# Patient Record
Sex: Female | Born: 1990 | Race: Black or African American | Hispanic: No | Marital: Single | Smoking: Former smoker
Health system: Southern US, Community
[De-identification: ages and names within clinical notes are randomized; demographics above are authoritative.]

## PROBLEM LIST (undated history)

## (undated) ENCOUNTER — Inpatient Hospital Stay (HOSPITAL_COMMUNITY): Payer: Self-pay

## (undated) DIAGNOSIS — J45909 Unspecified asthma, uncomplicated: Secondary | ICD-10-CM

## (undated) DIAGNOSIS — N200 Calculus of kidney: Secondary | ICD-10-CM

## (undated) DIAGNOSIS — R569 Unspecified convulsions: Secondary | ICD-10-CM

## (undated) DIAGNOSIS — E876 Hypokalemia: Secondary | ICD-10-CM

## (undated) DIAGNOSIS — D649 Anemia, unspecified: Secondary | ICD-10-CM

## (undated) HISTORY — PX: NO PAST SURGERIES: SHX2092

## (undated) HISTORY — PX: WISDOM TOOTH EXTRACTION: SHX21

---

## 2016-01-25 ENCOUNTER — Emergency Department (HOSPITAL_COMMUNITY): Payer: Medicaid Other

## 2016-01-25 ENCOUNTER — Emergency Department (HOSPITAL_COMMUNITY)
Admission: EM | Admit: 2016-01-25 | Discharge: 2016-01-25 | Disposition: A | Discharge status: Home / Self Care | Payer: Medicaid Other | Attending: Emergency Medicine | Admitting: Emergency Medicine

## 2016-01-25 ENCOUNTER — Encounter (HOSPITAL_COMMUNITY): Payer: Self-pay | Admitting: Emergency Medicine

## 2016-01-25 DIAGNOSIS — R112 Nausea with vomiting, unspecified: Secondary | ICD-10-CM | POA: Diagnosis present

## 2016-01-25 DIAGNOSIS — J45909 Unspecified asthma, uncomplicated: Secondary | ICD-10-CM | POA: Insufficient documentation

## 2016-01-25 DIAGNOSIS — Z3202 Encounter for pregnancy test, result negative: Secondary | ICD-10-CM | POA: Diagnosis not present

## 2016-01-25 DIAGNOSIS — N390 Urinary tract infection, site not specified: Secondary | ICD-10-CM | POA: Diagnosis not present

## 2016-01-25 HISTORY — DX: Unspecified asthma, uncomplicated: J45.909

## 2016-01-25 LAB — CBC
HCT: 35.1 % — ABNORMAL LOW (ref 36.0–46.0)
Hemoglobin: 11.2 g/dL — ABNORMAL LOW (ref 12.0–15.0)
MCH: 27.2 pg (ref 26.0–34.0)
MCHC: 31.9 g/dL (ref 30.0–36.0)
MCV: 85.2 fL (ref 78.0–100.0)
PLATELETS: 189 10*3/uL (ref 150–400)
RBC: 4.12 MIL/uL (ref 3.87–5.11)
RDW: 12.7 % (ref 11.5–15.5)
WBC: 7.3 10*3/uL (ref 4.0–10.5)

## 2016-01-25 LAB — COMPREHENSIVE METABOLIC PANEL
ALT: 11 U/L — ABNORMAL LOW (ref 14–54)
AST: 15 U/L (ref 15–41)
Albumin: 3.4 g/dL — ABNORMAL LOW (ref 3.5–5.0)
Alkaline Phosphatase: 53 U/L (ref 38–126)
Anion gap: 12 (ref 5–15)
BILIRUBIN TOTAL: 1 mg/dL (ref 0.3–1.2)
BUN: 7 mg/dL (ref 6–20)
CO2: 26 mmol/L (ref 22–32)
CREATININE: 0.94 mg/dL (ref 0.44–1.00)
Calcium: 9.1 mg/dL (ref 8.9–10.3)
Chloride: 101 mmol/L (ref 101–111)
GFR calc Af Amer: >60 mL/min (ref >60)
Glucose, Bld: 109 mg/dL — ABNORMAL HIGH (ref 65–99)
Potassium: 3 mmol/L — ABNORMAL LOW (ref 3.5–5.1)
Sodium: 139 mmol/L (ref 135–145)
TOTAL PROTEIN: 7 g/dL (ref 6.5–8.1)

## 2016-01-25 LAB — URINE MICROSCOPIC-ADD ON

## 2016-01-25 LAB — PREGNANCY, URINE: Preg Test, Ur: NEGATIVE

## 2016-01-25 LAB — URINALYSIS, ROUTINE W REFLEX MICROSCOPIC
BILIRUBIN URINE: NEGATIVE
GLUCOSE, UA: NEGATIVE mg/dL
KETONES UR: NEGATIVE mg/dL
NITRITE: NEGATIVE
PROTEIN: 30 mg/dL — AB
Specific Gravity, Urine: 1.012 (ref 1.005–1.030)
pH: 5.5 (ref 5.0–8.0)

## 2016-01-25 LAB — LIPASE, BLOOD: Lipase: 23 U/L (ref 11–51)

## 2016-01-25 MED ORDER — ONDANSETRON HCL 4 MG/2ML IJ SOLN
4.0000 mg | Freq: Once | INTRAMUSCULAR | Status: AC
  Administered 2016-01-25: 4 mg via INTRAVENOUS
  Filled 2016-01-25: qty 2

## 2016-01-25 MED ORDER — SODIUM CHLORIDE 0.9 % IV BOLUS (SEPSIS)
1000.0000 mL | Freq: Once | INTRAVENOUS | Status: AC
  Administered 2016-01-25: 1000 mL via INTRAVENOUS

## 2016-01-25 MED ORDER — CEPHALEXIN 500 MG PO CAPS: 500.0000 mg | ORAL_CAPSULE | Freq: Four times a day (QID) | ORAL | Status: DC

## 2016-01-25 MED ORDER — ONDANSETRON 8 MG PO TBDP: 8.0000 mg | ORAL_TABLET | Freq: Three times a day (TID) | ORAL | Status: DC | PRN

## 2016-01-25 MED ORDER — GI COCKTAIL ~~LOC~~
30.0000 mL | Freq: Once | ORAL | Status: DC
  Filled 2016-01-25: qty 30

## 2016-01-25 MED ORDER — ONDANSETRON 4 MG PO TBDP: 4.0000 mg | ORAL_TABLET | Freq: Once | ORAL | Status: DC | PRN

## 2016-01-25 MED ORDER — POTASSIUM CHLORIDE CRYS ER 20 MEQ PO TBCR
40.0000 meq | EXTENDED_RELEASE_TABLET | Freq: Once | ORAL | Status: AC
  Administered 2016-01-25: 40 meq via ORAL
  Filled 2016-01-25: qty 2

## 2016-01-25 NOTE — ED Notes (Signed)
Pt reports having wisdom teeth removed on Wednesday, reports epigastric abd pain and vomiting began after surgery.  Pt denies diarrhea.

## 2016-01-25 NOTE — Discharge Instructions (Signed)
zofran as prescribed as needed for nausea. Keflex for UTI. Make sure to drink plenty of fluids. Advance diet as tolerate. Follow up with your doctor. Return if worsening.   Nausea and Vomiting Nausea is a sick feeling that often comes before throwing up (vomiting). Vomiting is a reflex where stomach contents come out of your mouth. Vomiting can cause severe loss of body fluids (dehydration). Children and elderly adults can become dehydrated quickly, especially if they also have diarrhea. Nausea and vomiting are symptoms of a condition or disease. It is important to find the cause of your symptoms. CAUSES   Direct irritation of the stomach lining. This irritation can result from increased acid production (gastroesophageal reflux disease), infection, food poisoning, taking certain medicines (such as nonsteroidal anti-inflammatory drugs), alcohol use, or tobacco use.  Signals from the brain.These signals could be caused by a headache, heat exposure, an inner ear disturbance, increased pressure in the brain from injury, infection, a tumor, or a concussion, pain, emotional stimulus, or metabolic problems.  An obstruction in the gastrointestinal tract (bowel obstruction).  Illnesses such as diabetes, hepatitis, gallbladder problems, appendicitis, kidney problems, cancer, sepsis, atypical symptoms of a heart attack, or eating disorders.  Medical treatments such as chemotherapy and radiation.  Receiving medicine that makes you sleep (general anesthetic) during surgery. DIAGNOSIS Your caregiver may ask for tests to be done if the problems do not improve after a few days. Tests may also be done if symptoms are severe or if the reason for the nausea and vomiting is not clear. Tests may include:  Urine tests.  Blood tests.  Stool tests.  Cultures (to look for evidence of infection).  X-rays or other imaging studies. Test results can help your caregiver make decisions about treatment or the need for  additional tests. TREATMENT You need to stay well hydrated. Drink frequently but in small amounts.You may wish to drink water, sports drinks, clear broth, or eat frozen ice pops or gelatin dessert to help stay hydrated.When you eat, eating slowly may help prevent nausea.There are also some antinausea medicines that may help prevent nausea. HOME CARE INSTRUCTIONS   Take all medicine as directed by your caregiver.  If you do not have an appetite, do not force yourself to eat. However, you must continue to drink fluids.  If you have an appetite, eat a normal diet unless your caregiver tells you differently.  Eat a variety of complex carbohydrates (rice, wheat, potatoes, bread), lean meats, yogurt, fruits, and vegetables.  Avoid high-fat foods because they are more difficult to digest.  Drink enough water and fluids to keep your urine clear or pale yellow.  If you are dehydrated, ask your caregiver for specific rehydration instructions. Signs of dehydration may include:  Severe thirst.  Dry lips and mouth.  Dizziness.  Dark urine.  Decreasing urine frequency and amount.  Confusion.  Rapid breathing or pulse. SEEK IMMEDIATE MEDICAL CARE IF:   You have blood or Giovannetti flecks (like coffee grounds) in your vomit.  You have black or bloody stools.  You have a severe headache or stiff neck.  You are confused.  You have severe abdominal pain.  You have chest pain or trouble breathing.  You do not urinate at least once every 8 hours.  You develop cold or clammy skin.  You continue to vomit for longer than 24 to 48 hours.  You have a fever. MAKE SURE YOU:   Understand these instructions.  Will watch your condition.  Will get help  right away if you are not doing well or get worse.   This information is not intended to replace advice given to you by your health care provider. Make sure you discuss any questions you have with your health care provider.   Document  Released: 10/10/2005 Document Revised: 01/02/2012 Document Reviewed: 03/09/2011 Elsevier Interactive Patient Education [employer]2016 [employer]vier Inc.

## 2016-01-25 NOTE — ED Provider Notes (Signed)
CSN: [id number]     Arrival date & time 01/25/16  0744 History   First MD Initiated Contact with Patient 01/25/16 [phone]     Chief Complaint  Patient presents with  . Abdominal Pain  . Emesis     (Consider location/radiation/quality/duration/timing/severity/associated sxs/prior Treatment) HPI Mercedes [name] is a 25 y.o. female  With History of asthma, presents to emergency department complaining of epigastric abdominal pain, nausea, vomiting for 5 days. Patient states her symptoms began on the day after she had her wisdom teeth pulled. She reports being sedated for procedure. She is unsure if she received any stitches. She states pain and swelling in her teeth has improved. Pt only required pain medication for 1 day. She is currently not taking any medications. She reports over last 4-5 days she has had persistent nausea and vomiting with eating and drinking. She states even drinking water makes her immediately nauseated and at times makes her throw up. Denies fever. No diarrhea. No urinary symptoms. No blood in stool or emesis. No tx prior to coming in.    Past Medical History  Diagnosis Date  . Asthma    Past Surgical History  Procedure Laterality Date  . Wisdom tooth extraction     No family history on file. Social History  Substance Use Topics  . Smoking status: Never Smoker   . Smokeless tobacco: Never Used  . Alcohol Use: No   OB History    No data available     Review of Systems  Constitutional: Negative for fever and chills.  HENT: Positive for dental problem.   Respiratory: Negative for cough, chest tightness and shortness of breath.   Cardiovascular: Negative for chest pain, palpitations and leg swelling.  Gastrointestinal: Positive for nausea, vomiting and abdominal pain. Negative for diarrhea.  Genitourinary: Negative for dysuria, flank pain, vaginal bleeding, vaginal discharge, vaginal pain and pelvic pain.  Musculoskeletal: Negative for myalgias, arthralgias,  neck pain and neck stiffness.  Skin: Negative for rash.  Neurological: Negative for dizziness, weakness and headaches.  All other systems reviewed and are negative.     Allergies  Review of patient's allergies indicates not on file.  Home Medications   Prior to Admission medications   Not on File   BP 136/69 mmHg  Pulse 79  Temp(Src) 99.2 F (37.3 C) (Oral)  Resp 16  Ht  (1.626 m)  Wt 107.502 kg  BMI 40.66 kg/m2  SpO2 97%  LMP 12/27/2015 (Approximate) Physical Exam  Constitutional: She appears well-developed and well-nourished. No distress.  HENT:  Head: Normocephalic.  All 4 sockets with clot present. No gum swelling or any evidence of infection. No trismus. No swelling in the tongue.  Eyes: Conjunctivae are normal.  Neck: Neck supple.  Cardiovascular: Normal rate, regular rhythm and normal heart sounds.   Pulmonary/Chest: Effort normal and breath sounds normal. No respiratory distress. She has no wheezes. She has no rales.  Abdominal: Soft. Bowel sounds are normal. She exhibits no distension. There is tenderness. There is no rebound and no guarding.  RUQ, epigastric, LUQ abdominal tenderness  Musculoskeletal: She exhibits no edema.  Neurological: She is alert.  Skin: Skin is warm and dry.  Psychiatric: She has a normal mood and affect. Her behavior is normal.  Nursing note and vitals reviewed.   ED Course  Procedures (including critical care time) Labs Review Labs Reviewed  COMPREHENSIVE METABOLIC PANEL - Abnormal; Notable for the following:    Potassium 3.0 (*)    Glucose, Bld  109 (*)    Albumin 3.4 (*)    ALT 11 (*)    All other components within normal limits  CBC - Abnormal; Notable for the following:    Hemoglobin 11.2 (*)    HCT 35.1 (*)    All other components within normal limits  URINALYSIS, ROUTINE W REFLEX MICROSCOPIC (NOT AT [facility]) - Abnormal; Notable for the following:    APPearance CLOUDY (*)    Hgb urine dipstick SMALL (*)    Protein,  ur 30 (*)    Leukocytes, UA MODERATE (*)    All other components within normal limits  URINE MICROSCOPIC-ADD ON - Abnormal; Notable for the following:    Squamous Epithelial / LPF 6-30 (*)    Bacteria, UA MANY (*)    All other components within normal limits  LIPASE, BLOOD  PREGNANCY, URINE    Imaging Review [place] Abdomen Complete  01/25/2016  CLINICAL DATA:  Epigastric pain with nausea and vomiting after wisdom tooth surgery last week. EXAM: ABDOMEN ULTRASOUND COMPLETE COMPARISON:  None. FINDINGS: Gallbladder: No gallstones or wall thickening visualized. No sonographic Murphy sign noted by sonographer. Common bile duct: Diameter: 3 mm Liver: No focal lesion identified. Within normal limits in parenchymal echogenicity. IVC: No abnormality visualized. Pancreas: Visualized portion unremarkable. Spleen: Size and appearance within normal limits. Right Kidney: Length: 13.0 cm. Echogenicity within normal limits. No mass or hydronephrosis visualized. Left Kidney: Length: 12.2 cm. Echogenicity within normal limits. No mass or hydronephrosis visualized. Abdominal aorta: No aneurysm visualized. Other findings: None. IMPRESSION: Unremarkable abdominal ultrasound. Electronically Signed   By: Sebastian AcheAllen  Grady M.D.   On: 01/25/2016 10:41   I have personally reviewed and evaluated these images and lab results as part of my medical decision-making.   EKG Interpretation None      MDM   Final diagnoses:  Non-intractable vomiting with nausea, vomiting of unspecified type  UTI (lower urinary tract infection)    patient with upper abdominal pain for 4-5 days, nausea, vomiting. Vital signs are normal. Abdomen is soft with no guarding or peritoneal signs. Will check labs, urinalysis, urine pregnancy. Will start IV fluids and Zofran ordered for nausea. GI cocktail ordered. Will reassess.  10:55 AM Normal white blood cell count, potassium 3.0, given 40 mEq by mouth in the emergency department. Patient hydrated with 2  L of normal saline. She is feeling much better. Ultrasound abdomen is negative. Abdomen is soft, no guarding, do not think patient needs any further imaging in emergency department. She is having bowel movements and passing gas, do not think she has small bowel obstruction. No prior abdominal surgeries. Most likely viral gastroenteritis. Will treat with Zofran at home. Will also start on Keflex for UTI. Will have patient follow-up with primary care doctor. We did discuss return precautions.  Filed Vitals:   01/25/16 0830 01/25/16 0900 01/25/16 0930 01/25/16 1009  BP:      Pulse: 71 67 63 66  Temp:      TempSrc:      Resp:      Height:      Weight:      SpO2: 97% 99% 100% 100%     Jaynie Crumbleatyana Yaris Ferrell, PA-C 01/25/16 1057  Laurence Spatesachel Morgan Little, MD 01/25/16 1530

## 2016-01-25 NOTE — ED Notes (Signed)
Patient transported to Ultrasound 

## 2016-02-27 ENCOUNTER — Encounter (HOSPITAL_COMMUNITY): Payer: Self-pay

## 2016-02-27 ENCOUNTER — Emergency Department (HOSPITAL_COMMUNITY): Payer: Medicaid Other

## 2016-02-27 ENCOUNTER — Inpatient Hospital Stay (HOSPITAL_COMMUNITY)
Admission: EM | Admit: 2016-02-27 | Discharge: 2016-02-29 | DRG: 203 | Disposition: A | Discharge status: Home / Self Care | Payer: Medicaid Other | Attending: Internal Medicine | Admitting: Internal Medicine

## 2016-02-27 DIAGNOSIS — Z79899 Other long term (current) drug therapy: Secondary | ICD-10-CM

## 2016-02-27 DIAGNOSIS — J441 Chronic obstructive pulmonary disease with (acute) exacerbation: Secondary | ICD-10-CM | POA: Diagnosis present

## 2016-02-27 DIAGNOSIS — O99019 Anemia complicating pregnancy, unspecified trimester: Secondary | ICD-10-CM

## 2016-02-27 DIAGNOSIS — D649 Anemia, unspecified: Secondary | ICD-10-CM | POA: Diagnosis present

## 2016-02-27 DIAGNOSIS — J4520 Mild intermittent asthma, uncomplicated: Secondary | ICD-10-CM | POA: Diagnosis not present

## 2016-02-27 DIAGNOSIS — E876 Hypokalemia: Secondary | ICD-10-CM | POA: Diagnosis not present

## 2016-02-27 DIAGNOSIS — O99519 Diseases of the respiratory system complicating pregnancy, unspecified trimester: Secondary | ICD-10-CM

## 2016-02-27 DIAGNOSIS — J45909 Unspecified asthma, uncomplicated: Secondary | ICD-10-CM | POA: Diagnosis present

## 2016-02-27 DIAGNOSIS — J45901 Unspecified asthma with (acute) exacerbation: Secondary | ICD-10-CM | POA: Diagnosis present

## 2016-02-27 DIAGNOSIS — Z880 Allergy status to penicillin: Secondary | ICD-10-CM

## 2016-02-27 HISTORY — DX: Anemia, unspecified: D64.9

## 2016-02-27 HISTORY — DX: Hypokalemia: E87.6

## 2016-02-27 LAB — CBC WITH DIFFERENTIAL/PLATELET
BASOS ABS: 0 10*3/uL (ref 0.0–0.1)
BASOS PCT: 0 %
Eosinophils Absolute: 0 10*3/uL (ref 0.0–0.7)
Eosinophils Relative: 1 %
HEMATOCRIT: 32.9 % — AB (ref 36.0–46.0)
HEMOGLOBIN: 10.5 g/dL — AB (ref 12.0–15.0)
LYMPHS PCT: 16 %
Lymphs Abs: 1.1 10*3/uL (ref 0.7–4.0)
MCH: 27.2 pg (ref 26.0–34.0)
MCHC: 31.9 g/dL (ref 30.0–36.0)
MCV: 85.2 fL (ref 78.0–100.0)
MONO ABS: 0.2 10*3/uL (ref 0.1–1.0)
Monocytes Relative: 3 %
NEUTROS PCT: 80 %
Neutro Abs: 5.7 10*3/uL (ref 1.7–7.7)
Platelets: 160 10*3/uL (ref 150–400)
RBC: 3.86 MIL/uL — ABNORMAL LOW (ref 3.87–5.11)
RDW: 14 % (ref 11.5–15.5)
WBC: 7.1 10*3/uL (ref 4.0–10.5)

## 2016-02-27 LAB — MAGNESIUM: Magnesium: 1.8 mg/dL (ref 1.7–2.4)

## 2016-02-27 LAB — BASIC METABOLIC PANEL
ANION GAP: 13 (ref 5–15)
BUN: 9 mg/dL (ref 6–20)
CHLORIDE: 106 mmol/L (ref 101–111)
CO2: 21 mmol/L — AB (ref 22–32)
Calcium: 8.8 mg/dL — ABNORMAL LOW (ref 8.9–10.3)
Creatinine, Ser: 0.66 mg/dL (ref 0.44–1.00)
GFR calc non Af Amer: >60 mL/min (ref >60)
GLUCOSE: 103 mg/dL — AB (ref 65–99)
POTASSIUM: 2.9 mmol/L — AB (ref 3.5–5.1)
Sodium: 140 mmol/L (ref 135–145)

## 2016-02-27 LAB — POTASSIUM: POTASSIUM: 3 mmol/L — AB (ref 3.5–5.1)

## 2016-02-27 MED ORDER — IPRATROPIUM BROMIDE 0.02 % IN SOLN
0.5000 mg | RESPIRATORY_TRACT | Status: DC
  Administered 2016-02-27 (×2): 0.5 mg via RESPIRATORY_TRACT
  Filled 2016-02-27 (×2): qty 2.5

## 2016-02-27 MED ORDER — AZITHROMYCIN 250 MG PO TABS
500.0000 mg | ORAL_TABLET | Freq: Once | ORAL | Status: AC
  Administered 2016-02-27: 500 mg via ORAL
  Filled 2016-02-27: qty 2

## 2016-02-27 MED ORDER — PREDNISONE 20 MG PO TABS
60.0000 mg | ORAL_TABLET | Freq: Once | ORAL | Status: AC
  Administered 2016-02-27: 60 mg via ORAL
  Filled 2016-02-27: qty 3

## 2016-02-27 MED ORDER — MAGNESIUM CITRATE PO SOLN: 1.0000 | Freq: Once | ORAL | Status: DC | PRN

## 2016-02-27 MED ORDER — ONDANSETRON 4 MG PO TBDP
4.0000 mg | ORAL_TABLET | Freq: Once | ORAL | Status: AC
  Administered 2016-02-27: 4 mg via ORAL
  Filled 2016-02-27: qty 1

## 2016-02-27 MED ORDER — IPRATROPIUM BROMIDE 0.02 % IN SOLN: 0.5000 mg | Freq: Once | RESPIRATORY_TRACT | Status: DC

## 2016-02-27 MED ORDER — SODIUM CHLORIDE 0.9% FLUSH: 3.0000 mL | Freq: Two times a day (BID) | INTRAVENOUS | Status: DC

## 2016-02-27 MED ORDER — ACETAMINOPHEN 325 MG PO TABS
650.0000 mg | ORAL_TABLET | Freq: Four times a day (QID) | ORAL | Status: DC | PRN
  Administered 2016-02-27: 650 mg via ORAL
  Filled 2016-02-27: qty 2

## 2016-02-27 MED ORDER — ENOXAPARIN SODIUM 40 MG/0.4ML ~~LOC~~ SOLN
40.0000 mg | SUBCUTANEOUS | Status: DC
  Filled 2016-02-27: qty 0.4

## 2016-02-27 MED ORDER — SODIUM CHLORIDE 0.9% FLUSH: 3.0000 mL | INTRAVENOUS | Status: DC | PRN

## 2016-02-27 MED ORDER — LEVALBUTEROL HCL 1.25 MG/0.5ML IN NEBU
1.2500 mg | INHALATION_SOLUTION | Freq: Four times a day (QID) | RESPIRATORY_TRACT | Status: DC
  Administered 2016-02-27 – 2016-02-29 (×8): 1.25 mg via RESPIRATORY_TRACT
  Filled 2016-02-27 (×9): qty 0.5

## 2016-02-27 MED ORDER — SODIUM CHLORIDE 0.9 % IV SOLN: 250.0000 mL | INTRAVENOUS | Status: DC | PRN

## 2016-02-27 MED ORDER — SODIUM CHLORIDE 0.9 % IV SOLN
INTRAVENOUS | Status: DC
  Administered 2016-02-27 – 2016-02-28 (×5): via INTRAVENOUS

## 2016-02-27 MED ORDER — METHYLPREDNISOLONE SODIUM SUCC 125 MG IJ SOLR
60.0000 mg | Freq: Four times a day (QID) | INTRAMUSCULAR | Status: DC
  Administered 2016-02-27 – 2016-02-29 (×9): 60 mg via INTRAVENOUS
  Filled 2016-02-27 (×9): qty 2

## 2016-02-27 MED ORDER — PNEUMOCOCCAL VAC POLYVALENT 25 MCG/0.5ML IJ INJ
0.5000 mL | INJECTION | INTRAMUSCULAR | Status: DC
  Filled 2016-02-27: qty 0.5

## 2016-02-27 MED ORDER — ALBUTEROL (5 MG/ML) CONTINUOUS INHALATION SOLN
10.0000 mg/h | INHALATION_SOLUTION | Freq: Once | RESPIRATORY_TRACT | Status: AC
  Administered 2016-02-27: 10 mg/h via RESPIRATORY_TRACT
  Filled 2016-02-27: qty 20

## 2016-02-27 MED ORDER — ONDANSETRON HCL 4 MG/2ML IJ SOLN: 4.0000 mg | Freq: Four times a day (QID) | INTRAMUSCULAR | Status: DC | PRN

## 2016-02-27 MED ORDER — HYDROCODONE-ACETAMINOPHEN 5-325 MG PO TABS
1.0000 | ORAL_TABLET | Freq: Once | ORAL | Status: AC
  Administered 2016-02-27: 1 via ORAL
  Filled 2016-02-27: qty 1

## 2016-02-27 MED ORDER — HYDROCODONE-ACETAMINOPHEN 5-325 MG PO TABS
1.0000 | ORAL_TABLET | ORAL | Status: DC | PRN
  Administered 2016-02-27 – 2016-02-29 (×2): 2 via ORAL
  Filled 2016-02-27 (×2): qty 2

## 2016-02-27 MED ORDER — ALBUTEROL SULFATE (2.5 MG/3ML) 0.083% IN NEBU
5.0000 mg | INHALATION_SOLUTION | Freq: Once | RESPIRATORY_TRACT | Status: AC
  Administered 2016-02-27: 5 mg via RESPIRATORY_TRACT
  Filled 2016-02-27: qty 6

## 2016-02-27 MED ORDER — LEVALBUTEROL HCL 1.25 MG/0.5ML IN NEBU
1.2500 mg | INHALATION_SOLUTION | Freq: Four times a day (QID) | RESPIRATORY_TRACT | Status: DC
  Administered 2016-02-27: 1.25 mg via RESPIRATORY_TRACT
  Filled 2016-02-27 (×2): qty 0.5

## 2016-02-27 MED ORDER — IPRATROPIUM BROMIDE 0.02 % IN SOLN
0.5000 mg | Freq: Four times a day (QID) | RESPIRATORY_TRACT | Status: DC
  Administered 2016-02-27 – 2016-02-29 (×8): 0.5 mg via RESPIRATORY_TRACT
  Filled 2016-02-27 (×9): qty 2.5

## 2016-02-27 MED ORDER — ONDANSETRON HCL 4 MG PO TABS: 4.0000 mg | ORAL_TABLET | Freq: Four times a day (QID) | ORAL | Status: DC | PRN

## 2016-02-27 MED ORDER — TRAZODONE HCL 50 MG PO TABS: 25.0000 mg | ORAL_TABLET | Freq: Every evening | ORAL | Status: DC | PRN

## 2016-02-27 MED ORDER — SODIUM CHLORIDE 0.9% FLUSH
3.0000 mL | Freq: Two times a day (BID) | INTRAVENOUS | Status: DC
  Administered 2016-02-28 – 2016-02-29 (×2): 3 mL via INTRAVENOUS

## 2016-02-27 MED ORDER — ALBUTEROL SULFATE (2.5 MG/3ML) 0.083% IN NEBU: 5.0000 mg | INHALATION_SOLUTION | Freq: Once | RESPIRATORY_TRACT | Status: DC

## 2016-02-27 MED ORDER — ENSURE ENLIVE PO LIQD
237.0000 mL | Freq: Two times a day (BID) | ORAL | Status: DC
  Administered 2016-02-27 – 2016-02-29 (×3): 237 mL via ORAL

## 2016-02-27 MED ORDER — SENNOSIDES-DOCUSATE SODIUM 8.6-50 MG PO TABS: 1.0000 | ORAL_TABLET | Freq: Every evening | ORAL | Status: DC | PRN

## 2016-02-27 MED ORDER — ACETAMINOPHEN 650 MG RE SUPP: 650.0000 mg | Freq: Four times a day (QID) | RECTAL | Status: DC | PRN

## 2016-02-27 MED ORDER — BISACODYL 10 MG RE SUPP: 10.0000 mg | Freq: Every day | RECTAL | Status: DC | PRN

## 2016-02-27 MED ORDER — IPRATROPIUM BROMIDE 0.02 % IN SOLN
0.5000 mg | Freq: Once | RESPIRATORY_TRACT | Status: AC
  Administered 2016-02-27: 0.5 mg via RESPIRATORY_TRACT
  Filled 2016-02-27: qty 2.5

## 2016-02-27 NOTE — H&P (Signed)
History and Physical    Mercedes PascalShawnte Monne Mcfarlane WUJ:[id number] DOB: [birth year] DOA: 02/27/2016  Referring MD/NP/PA: EDP PCP: No primary care provider on file.  Outpatient Specialists: No care team member to display  Patient coming from:  Home  Chief Complaint: Asthma Exacerbation  HPI: Mercedes [name] is a 25 y.o. female with a history of asthma, presenting with acute onset of SOB since midnight, and wheezing and cough. Patient used albuterol home inhaler without significant relief. She reports having moved last month from [place] with her 2 small children which may have been a source of stress. Last hospitalization for asthma exacerbation was 2 years ago.  Denies any fevers, chest pain, palpitations, loss of conscioussness, or productive cough.She denies fevers, chills, night sweats, vision changes, or mucositis. Denies any chest pain or palpitations. Denies lower extremity swelling. Denies nausea, heartburn or change in bowel habits. Denies abdominal pain. Appetite is normal. Denies any dysuria. Denies abnormal skin rashes, or neuropathy. Denies any bleeding issues such as epistaxis, hematemesis, hematuria or hematochezia. She has been increasingly tired over the last 2 months. Ambulating without difficulty. Does not smoke.    ED Course:  BP 118/66 mmHg  Pulse 97  Temp(Src) 98.1 F (36.7 C)  Resp 18  Ht 5\' 5"  (1.651 m)  Wt 108.863 kg (240 lb)  BMI 39.94 kg/m2  SpO2 96%  LMP 02/27/2016 (Exact Date) CXR showed peribronchial thickening. consistent with asthma exacerbation.  Received Albuterol/Ipratropium nebs and prednisone 60 mg x1 with some relief. WBC 7.3, tachycardia, tachypnea, temperature normal. Initially EDP wanted to discharge patient but symptoms worsened on ambulation for which she is to be admitted. CBC  Remarkable for Hb 10.5 normal 7.1 and platelet and CMET remark for K 2.9   Review of Systems: As per HPI otherwise 10 point review of systems negative.   Past Medical History    Diagnosis Date  . Asthma     Past Surgical History  Procedure Laterality Date  . Wisdom tooth extraction       reports that she has never smoked. She has never used smokeless tobacco. She reports that she does not drink alcohol or use illicit drugs.  Allergies  Allergen Reactions  . Penicillins     Childhood allergy    Family History  Problem Relation Age of Onset  . Cancer Mother   . Asthma Mother     Family history reviewed and not pertinent (If you reviewed it)  Prior to Admission medications   Medication Sig Start Date End Date Taking? Authorizing Provider  albuterol (PROVENTIL) (2.5 MG/3ML) 0.083% nebulizer solution Take 2.5 mg by nebulization every 6 (six) hours as needed for wheezing or shortness of breath.   Yes Historical Provider, MD  cephALEXin (KEFLEX) 500 MG capsule Take 1 capsule (500 mg total) by mouth 4 (four) times daily. Patient not taking: Reported on 02/27/2016 01/25/16   Tatyana Kirichenko, PA-C  ondansetron (ZOFRAN ODT) 8 MG disintegrating tablet Take 1 tablet (8 mg total) by mouth every 8 (eight) hours as needed for nausea or vomiting. Patient not taking: Reported on 02/27/2016 01/25/16   Jaynie Crumbleatyana Kirichenko, PA-C    Physical Exam:    Filed Vitals:   02/27/16 0600 02/27/16 0615 02/27/16 0700 02/27/16 0742  BP: 143/59 116/63 122/55 118/66  Pulse:    97  Temp:    98.1 F (36.7 C)  Resp:    18  Height:      Weight:      SpO2:    96%  Constitutional: NAD, calm, comfortable Filed Vitals:   02/27/16 0600 02/27/16 0615 02/27/16 0700 02/27/16 0742  BP: 143/59 116/63 122/55 118/66  Pulse:    97  Temp:    98.1 F (36.7 C)  Resp:    18  Height:      Weight:      SpO2:    96%   Eyes: PERRL, lids and conjunctivae normal ENMT: Mucous membranes are moist. Posterior pharynx clear of any exudate or lesions.Normal dentition.  Neck: normal, supple, no masses, no thyromegaly Respiratory: bilateral  wheezing, no crackles, diffuse rhonchi. Normal  respiratory effort. No accessory muscle use.  Cardiovascular: Regular rate and rhythm, no murmurs / rubs / gallops. No extremity edema. 2+ pedal pulses. No carotid bruits.  Abdomen: no tenderness, no masses palpated. No hepatosplenomegaly. Bowel sounds positive.  Musculoskeletal: no clubbing / cyanosis. No joint deformity upper and lower extremities. Good ROM, no contractures. Normal muscle tone.  Skin: no rashes, lesions, ulcers. No induration Neurologic: CN 2-12 grossly intact. Sensation intact, DTR normal. Strength 5/5 in all 4.  Psychiatric: Normal judgment and insight. Alert and oriented x 3. Normal mood.     Labs on Admission: I have personally reviewed following labs and imaging studies  CBC:  Recent Labs Lab 02/27/16 0634  WBC 7.1  NEUTROABS 5.7  HGB 10.5*  HCT 32.9*  MCV 85.2  PLT 160    Basic Metabolic Panel:  Recent Labs Lab 02/27/16 0634  NA 140  K 2.9*  CL 106  CO2 21*  GLUCOSE 103*  BUN 9  CREATININE 0.66  CALCIUM 8.8*    GFR: Estimated Creatinine Clearance: 133.2 mL/min (by C-G formula based on Cr of 0.66).   Sepsis Labs: @LABRCNTIP (procalcitonin:4,lacticidven:4) )No results found for this or any previous visit (from the past 240 hour(s)).   Radiological Exams on Admission: Dg Chest 2 View  02/27/2016  CLINICAL DATA:  Shortness of breath, cough, and chest tightness for 2 days. Nonsmoker. History of asthma. EXAM: CHEST  2 VIEW COMPARISON:  None. FINDINGS: Normal heart size and pulmonary vascularity. Peribronchial thickening and central interstitial prominence consistent with asthma or reactive airways disease. No focal consolidation or airspace disease. No blunting of costophrenic angles. No pneumothorax. Mediastinal contours appear intact. Prominent transverse processes at C7. IMPRESSION: Peribronchial thickening with central interstitial pattern consistent with asthma or reactive airways disease. Electronically Signed   By: Burman Nieves M.D.   On:  02/27/2016 05:30    EKG: Independently reviewed.  Assessment/Plan Active Problems:   Asthma exacerbation   Anemia   Hypokalemia   Asthma   Acute respiratory distress without hypoxia/ Asthma exacerbation. CXR showed peribronchial thickening consistent with asthma exacerbation.  Received Albuterol/Ipratropium nebs and prednisone 60 mg x 1 dose with some relief. WBC 7.3, tachycardia, tachypnea, temperature normal.  -Admit to tele obs -Continue supportive care with oxygen as needed -Scheduled Atrovent every 4 hrs Xopenex every 6 hrs -IV Solu-Medrol 60 mg every 6 hours  -Respiratory therapy  Hypokalemia, Unknown etiology, likely dietary. She is not symptomatic. VS normal . Not confused.  Current K  2.7   Recheck labs EKG  Oral replenishment 40 bid if repeat is abnl.  Check Mg  Anemia Hb 10.5 with nl MCV. Currently menstruating.  Recheck in am. -Transfuse 1 unit packed red blood cells if Hb is less than 8 or acutely bleeding    DVT prophylaxis: Lovenox Code Status:   Full    Family Communication:  Discussed with patient Disposition Plan: Expect patient to  be discharged to home Consults called:    None Admission status: Obs Tele   Marlowe Kays E, PA-C [facility]   If 7PM-7AM, please contact night-coverage [email or web address] Password [facility]  02/27/2016, 8:37 AM

## 2016-02-27 NOTE — Progress Notes (Signed)
Mercedes [name] [id number] Admission Data: 02/27/2016 8:00 AM Attending Provider: Pete Glatterawn T Langeland, MD  PCP:No primary care provider on file. Consults/ Treatment Team:    Mercedes PascalShawnte Monne Trovato is a 25 y.o. female patient admitted from ED awake, alert  & orientated  X 3,  No Order, VSS - Blood pressure 118/66, pulse 97, temperature 98.1 F (36.7 C), resp. rate 18, height 5\' 5"  (1.651 m), weight 108.863 kg (240 lb), last menstrual period 02/27/2016, SpO2 96 %., no c/o shortness of breath, no c/o chest pain, no distress noted. Tele # 01 placed.   [facility]   Allergies:   Allergies  Allergen Reactions  . Penicillins     Childhood allergy     Past Medical History  Diagnosis Date  . Asthma      Pt orientation to unit, room and routine. Information packet given to patient/family and safety video watched.  Admission INP armband ID verified with patient/family, and in place. SR up x 2, fall risk assessment complete with Patient and family verbalizing understanding of risks associated with falls. Pt verbalizes an understanding of how to use the call bell and to call for help before getting out of bed.  Skin, clean-dry- intact without evidence of bruising, or skin tears.   No evidence of skin break down noted on exam.     Will cont to monitor and assist as needed.  Kern ReapBrumagin, Kaley Jutras L, RN 02/27/2016 8:00 AM

## 2016-02-27 NOTE — ED Provider Notes (Signed)
CSN: [id number]     Arrival date & time 02/27/16  0245 History   First MD Initiated Contact with Patient 02/27/16 0258     Chief Complaint  Patient presents with  . Asthma     (Consider location/radiation/quality/duration/timing/severity/associated sxs/prior Treatment) HPI   Patient has a PMH of asthma. She comes to the ER with asthma exacerbation. She woke up around midnight have an asthma attach. She [facility] the [facility] but it was not helping her symptoms were worsening. On arrival she has increased effort of breathing, retractions and audible wheezing. She denies that she has had cough, fever, congestion, sneezing, headache, sore throat or any other associated symptoms and reports she has been feeling well. The last time she had a severe asthma attack was a few years ago. She does not know what triggered tonight's attack.  Mercedes [name] is a 25 y.o. female  PCP: No primary care provider on file.  Blood pressure 122/72, pulse 94, temperature 98.6 F (37 C), resp. rate 20, height 5\' 5"  (1.651 m), weight 108.863 kg, last menstrual period 02/27/2016, SpO2 99 %.  Negative ROS: Confusion, diaphoresis, fever, headache, weakness (general or focal), change of vision,  neck pain, dysphagia, aphagia, chest pain,   back pain, abdominal pains, nausea, vomiting, diarrhea, lower extremity swelling, rash.   Past Medical History  Diagnosis Date  . Asthma    Past Surgical History  Procedure Laterality Date  . Wisdom tooth extraction     History reviewed. No pertinent family history. Social History  Substance Use Topics  . Smoking status: Never Smoker   . Smokeless tobacco: Never Used  . Alcohol Use: No   OB History    No data available     Review of Systems  Review of Systems All other systems negative except as documented in the HPI. All pertinent positives and negatives as reviewed in the HPI.   Allergies  Penicillins  Home Medications   Prior to Admission  medications   Medication Sig Start Date End Date Taking? Authorizing Provider  albuterol (PROVENTIL) (2.5 MG/3ML) 0.083% nebulizer solution Take 2.5 mg by nebulization every 6 (six) hours as needed for wheezing or shortness of breath.   Yes Historical Provider, MD  cephALEXin (KEFLEX) 500 MG capsule Take 1 capsule (500 mg total) by mouth 4 (four) times daily. Patient not taking: Reported on 02/27/2016 01/25/16   Tatyana Kirichenko, PA-C  ondansetron (ZOFRAN ODT) 8 MG disintegrating tablet Take 1 tablet (8 mg total) by mouth every 8 (eight) hours as needed for nausea or vomiting. Patient not taking: Reported on 02/27/2016 01/25/16   Tatyana Kirichenko, PA-C   BP 110/93 mmHg  Pulse 109  Temp(Src) 98.6 F (37 C)  Resp 20  Ht 5\' 5"  (1.651 m)  Wt 108.863 kg  BMI 39.94 kg/m2  SpO2 96%  LMP 02/27/2016 (Exact Date) Physical Exam  Constitutional: She appears well-developed and well-nourished. No distress.  HENT:  Head: Normocephalic and atraumatic.  Right Ear: Tympanic membrane and ear canal normal.  Left Ear: Tympanic membrane and ear canal normal.  Nose: Nose normal.  Mouth/Throat: Uvula is midline, oropharynx is clear and moist and mucous membranes are normal.  Eyes: Pupils are equal, round, and reactive to light.  Neck: Normal range of motion. Neck supple.  Cardiovascular: Normal rate and regular rhythm.   Pulmonary/Chest: Accessory muscle usage present. No tachypnea. No respiratory distress. She has decreased breath sounds. She has wheezes (diffuse moderate to severe wheezing).  + increased  effort but is not in distress. Able to speak in 5 word sentences and then becomes somewhat breathless.  Abdominal: Soft.  No signs of abdominal distention  Musculoskeletal:  No LE swelling  Neurological: She is alert.  Acting at baseline  Skin: Skin is warm and dry. No rash noted.  Nursing note and vitals reviewed.   ED Course  Procedures (including critical care time) Labs Review Labs Reviewed   CBC WITH DIFFERENTIAL/PLATELET  BASIC METABOLIC PANEL    Imaging Review Dg Chest 2 View  02/27/2016  CLINICAL DATA:  Shortness of breath, cough, and chest tightness for 2 days. Nonsmoker. History of asthma. EXAM: CHEST  2 VIEW COMPARISON:  None. FINDINGS: Normal heart size and pulmonary vascularity. Peribronchial thickening and central interstitial prominence consistent with asthma or reactive airways disease. No focal consolidation or airspace disease. No blunting of costophrenic angles. No pneumothorax. Mediastinal contours appear intact. Prominent transverse processes at C7. IMPRESSION: Peribronchial thickening with central interstitial pattern consistent with asthma or reactive airways disease. Electronically Signed   By: Burman Nieves M.D.   On: 02/27/2016 05:30   I have personally reviewed and evaluated these images and lab results as part of my medical decision-making.   EKG Interpretation None      MDM   Final diagnoses:  Asthma exacerbation  Reactive airway disease, unspecified asthma severity, uncomplicated    CRITICAL CARE Performed by: Dorthula Matas Total critical care time: 35 minutes Critical care time was exclusive of separately billable procedures and treating other patients. Critical care was necessary to treat or prevent imminent or life-threatening deterioration. Critical care was time spent personally by me on the following activities: development of treatment plan with patient and/or surrogate as well as nursing, discussions with consultants, evaluation of patient's response to treatment, examination of patient, obtaining history from patient or surrogate, ordering and performing treatments and interventions, ordering and review of laboratory studies, ordering and review of radiographic studies, pulse oximetry and re-evaluation of patient's condition.   Medications  azithromycin (ZITHROMAX) tablet 500 mg (not administered)  0.9 %  sodium chloride infusion  (not administered)  albuterol (PROVENTIL) (2.5 MG/3ML) 0.083% nebulizer solution 5 mg (not administered)  ipratropium (ATROVENT) nebulizer solution 0.5 mg (not administered)  albuterol (PROVENTIL) (2.5 MG/3ML) 0.083% nebulizer solution 5 mg (5 mg Nebulization Given 02/27/16 0303)  predniSONE (DELTASONE) tablet 60 mg (60 mg Oral Given 02/27/16 0405)  albuterol (PROVENTIL,VENTOLIN) solution continuous neb (10 mg/hr Nebulization Given 02/27/16 0350)  ipratropium (ATROVENT) nebulizer solution 0.5 mg (0.5 mg Nebulization Given 02/27/16 0350)  HYDROcodone-acetaminophen (NORCO/VICODIN) 5-325 MG per tablet 1 tablet (1 tablet Oral Given 02/27/16 0416)  ondansetron (ZOFRAN-ODT) disintegrating tablet 4 mg (4 mg Oral Given 02/27/16 0416)   4:09 am Pt had one breathing treatment in which the patient reports symptomatic relief, she however is still retracting and have increased effort.  Hour long nebulzer and chest xray ordered.  6:00 pm The patient was monitored for two more hours. I was attempting to get her discharged and had her ambulate the hallway, the patient is tearful, having increased effort of breathing and severe wheeze. She reports feeling like she is not moving any air. At this point I will get patient admitted for observation and more breathing treatments before she can safely go home. Chest xray shows asthma vs reactive airway disease.  Admit, [facility], [facility] hospitalist, Dr. Clyde Lundborg, obs, Tele. He agrees to follow-up on labs have not yet been drawn.    Marlon Pel, PA-C 02/27/16 1610  Caryn Bee  Patria Mane, MD 02/27/16 [phone]

## 2016-02-27 NOTE — Progress Notes (Signed)
This is a no charge note  Boyd Kerbsenny in remission per PA, Tiffany  25 year old lady with past medical history of asthma, who presents with cough, SOB and wheezing. Chest x-ray showed peribronchial thickening. consistent with asthma exacerbation. WBC 7.3, tachycardia, tachypnea, temperature normal. Initially EDP wanted to discharge patient, did not do lab, but when ambulate patient, symptoms get worse. Pt is accepted to tele for obs. Pending CBC and BMP.  Lorretta HarpXilin Kage Willmann, MD  [facility] Pager [phone]  If 7PM-7AM, please contact night-coverage [email or web address] Password [email or web address] 02/27/2016, 6:21 AM

## 2016-02-27 NOTE — ED Notes (Signed)
Ambulated with pt in hallway, sats remained >96% on room air. Began coughing and breathing harder after activity.

## 2016-02-27 NOTE — ED Notes (Signed)
Pt from home, pt states "I've been having difficulty breathing since around 0030, I used my nebulizer and it didn't help me" Pt has hx of asthma. Pt able to speak in complete sentences.

## 2016-02-27 NOTE — ED Notes (Signed)
Attempted to call report to 5W 

## 2016-02-27 NOTE — ED Notes (Signed)
Attempted to call report to 5W x 2  

## 2016-02-27 NOTE — ED Notes (Signed)
Pt taken to Xray.

## 2016-02-28 DIAGNOSIS — E876 Hypokalemia: Secondary | ICD-10-CM

## 2016-02-28 DIAGNOSIS — D649 Anemia, unspecified: Secondary | ICD-10-CM | POA: Diagnosis present

## 2016-02-28 DIAGNOSIS — J45901 Unspecified asthma with (acute) exacerbation: Principal | ICD-10-CM

## 2016-02-28 DIAGNOSIS — J4520 Mild intermittent asthma, uncomplicated: Secondary | ICD-10-CM | POA: Diagnosis not present

## 2016-02-28 DIAGNOSIS — Z79899 Other long term (current) drug therapy: Secondary | ICD-10-CM | POA: Diagnosis not present

## 2016-02-28 DIAGNOSIS — Z880 Allergy status to penicillin: Secondary | ICD-10-CM | POA: Diagnosis not present

## 2016-02-28 LAB — COMPREHENSIVE METABOLIC PANEL
ALK PHOS: 47 U/L (ref 38–126)
ALT: 16 U/L (ref 14–54)
AST: 16 U/L (ref 15–41)
Albumin: 3 g/dL — ABNORMAL LOW (ref 3.5–5.0)
Anion gap: 11 (ref 5–15)
BUN: 5 mg/dL — AB (ref 6–20)
CALCIUM: 8.9 mg/dL (ref 8.9–10.3)
CO2: 18 mmol/L — ABNORMAL LOW (ref 22–32)
CREATININE: 0.54 mg/dL (ref 0.44–1.00)
Chloride: 109 mmol/L (ref 101–111)
Glucose, Bld: 122 mg/dL — ABNORMAL HIGH (ref 65–99)
Potassium: 3.9 mmol/L (ref 3.5–5.1)
Sodium: 138 mmol/L (ref 135–145)
Total Bilirubin: 0.5 mg/dL (ref 0.3–1.2)
Total Protein: 7.2 g/dL (ref 6.5–8.1)

## 2016-02-28 LAB — CBC
HCT: 33.8 % — ABNORMAL LOW (ref 36.0–46.0)
Hemoglobin: 10.8 g/dL — ABNORMAL LOW (ref 12.0–15.0)
MCH: 27.3 pg (ref 26.0–34.0)
MCHC: 32 g/dL (ref 30.0–36.0)
MCV: 85.6 fL (ref 78.0–100.0)
PLATELETS: 186 10*3/uL (ref 150–400)
RBC: 3.95 MIL/uL (ref 3.87–5.11)
RDW: 13.9 % (ref 11.5–15.5)
WBC: 9.6 10*3/uL (ref 4.0–10.5)

## 2016-02-28 MED ORDER — ALBUTEROL SULFATE (2.5 MG/3ML) 0.083% IN NEBU
2.5000 mg | INHALATION_SOLUTION | Freq: Four times a day (QID) | RESPIRATORY_TRACT | Status: DC | PRN
  Filled 2016-02-28: qty 3

## 2016-02-28 MED ORDER — ENOXAPARIN SODIUM 60 MG/0.6ML ~~LOC~~ SOLN
50.0000 mg | SUBCUTANEOUS | Status: DC
  Filled 2016-02-28: qty 0.6

## 2016-02-28 NOTE — Evaluation (Signed)
Occupational Therapy Evaluation and Discharge Patient Details Name: Mercedes PascalShawnte Monne [name] MRN: [id number] DOB: June 22, 1991 Today's Date: 02/28/2016    History of Present Illness 25 y.o. female with a history of asthma, presenting with acute onset of SOB, and wheezing and cough.   Clinical Impression   Pt reports she was independent with ADLs PTA. Currently pt is overall mod I for ADLs and functional mobility requiring increased time secondary to SOB/coughing. Pt tolerated mobility outside of room; observed to be SOB, coughing, and wheezing but SpO2=94%. Returned to room where pt sat EOB and self-administered home inhaler; SpO2=99-100%. No further acute OT needs identified; signing off at this time. Please re-consult if needs change. Thank you for this referral.   Follow Up Recommendations  No OT follow up;Supervision - Intermittent    Equipment Recommendations  None recommended by OT    Recommendations for Other Services       Precautions / Restrictions Precautions Precautions: None Restrictions Weight Bearing Restrictions: No      Mobility Bed Mobility Overal bed mobility: Modified Independent             General bed mobility comments: Increased time.  Transfers Overall transfer level: Modified independent Equipment used: None             General transfer comment: Increased time.    Balance Overall balance assessment: No apparent balance deficits (not formally assessed)                                          ADL Overall ADL's : Modified independent                                       General ADL Comments: Increased time required with functional mobility and ADLs secondary to SOB and coughing. Performed functional mobility outside of room; pt observed to be SOB, wheezing, and coughing but SpO2=94% on RA. Returned to room where pt rested sitting EOB and took home inhaler; SpO2=99-100%.     Vision     Perception      Praxis      Pertinent Vitals/Pain Pain Assessment: No/denies pain     Hand Dominance     Extremity/Trunk Assessment Upper Extremity Assessment Upper Extremity Assessment: Overall WFL for tasks assessed   Lower Extremity Assessment Lower Extremity Assessment: Overall WFL for tasks assessed   Cervical / Trunk Assessment Cervical / Trunk Assessment: Normal   Communication Communication Communication: No difficulties   Cognition Arousal/Alertness: Awake/alert Behavior During Therapy: WFL for tasks assessed/performed Overall Cognitive Status: Within Functional Limits for tasks assessed                     General Comments       Exercises       Shoulder Instructions      Home Living Family/patient expects to be discharged to:: Private residence Living Arrangements: Children;Spouse/significant other Available Help at Discharge: Family;Available PRN/intermittently Type of Home: House       Home Layout: Two level;Bed/bath upstairs     Bathroom Shower/Tub: Tub/shower unit Shower/tub characteristics: Engineer, building servicesCurtain Bathroom Toilet: Standard     Home Equipment: Bedside commode   Additional Comments: BSC is for when mother comes to visit      Prior Functioning/Environment Level of Independence: Independent  Comments: Takes care of her 2 young children    OT Diagnosis: Other (comment) (SOB)   OT Problem List:     OT Treatment/Interventions:      OT Goals(Current goals can be found in the care plan section) Acute Rehab OT Goals Patient Stated Goal: Not get tired/SOB so quickly  OT Goal Formulation: All assessment and education complete, DC therapy  OT Frequency:     Barriers to D/C:            Co-evaluation              End of Session Nurse Communication: Other (comment) (SpO2 during mobility and IV pulling out)  Activity Tolerance: Patient limited by fatigue Patient left: in bed;with call bell/phone within reach;with family/visitor  present   Time: 1610-9604 OT Time Calculation (min): 17 min Charges:  OT General Charges $OT Visit: 1 Procedure OT Evaluation $OT Eval Low Complexity: 1 Procedure G-Codes: OT G-codes **NOT FOR INPATIENT CLASS** Functional Assessment Tool Used: Clinical judgement Functional Limitation: Self care Self Care Current Status (V4098): 0 percent impaired, limited or restricted Self Care Goal Status (J1914): 0 percent impaired, limited or restricted Self Care Discharge Status (N8295): 0 percent impaired, limited or restricted   Gaye Alken M.S., OTR/L Pager: [phone]  02/28/2016, 8:47 AM

## 2016-02-28 NOTE — Progress Notes (Signed)
PROGRESS NOTE    Lindaann PascalShawnte Monne Sirianni  DQQ:[id number] DOB: April 13, 1991 DOA: 02/27/2016 PCP: No primary care provider on file.   Outpatient Specialists:     Brief Narrative:  Mercedes [name] is a 25 y.o. female with a Past Medical History of asthma, who has not seen MD recently, new to [place] from [place], who presents with worsening sob/doe couple of days. Min cough, sub fevers, only has albuterol mdi at home, using more frequently lately.    Assessment & Plan:   Active Problems:   Asthma exacerbation   Anemia   Hypokalemia   Asthma   asthma exacerbation, not status asthmaticus currently. Continue duonebs scheduled, iv steroids scheduled, prn albuterol   hypokalemia - repeat levels, chk mg.   DVT prophylaxis:  Lovenox   Code Status: Full Code   Family Communication: Sig other at bedside  Disposition Plan:     Consultants:     Procedures:     Antimicrobials:      Subjective: Still coughing  Objective: Filed Vitals:   02/28/16 0527 02/28/16 0529 02/28/16 0916 02/28/16 1327  BP: 127/56     Pulse: 77     Temp: 98.2 F (36.8 C)     TempSrc: Oral     Resp: 16     Height:      Weight:  103.874 kg (229 lb)    SpO2: 95%  96% 98%    Intake/Output Summary (Last 24 hours) at 02/28/16 1334 Last data filed at 02/28/16 0600  Gross per 24 hour  Intake 2927.09 ml  Output   1200 ml  Net 1727.09 ml   Filed Weights   02/27/16 0742 02/27/16 0914 02/28/16 0529  Weight: 104.8 kg (231 lb 0.7 oz) 104.8 kg (231 lb 0.7 oz) 103.874 kg (229 lb)    Examination:  General exam: Appears calm and comfortable  Respiratory system: no increased work of breathing but still tight and wheezing Cardiovascular system: S1 & S2 heard, RRR. No JVD, murmurs, rubs, gallops or clicks. No pedal edema. Gastrointestinal system: Abdomen is nondistended, soft and nontender. No organomegaly or masses felt. Normal bowel sounds heard. Central nervous system: Alert and  oriented. No focal neurological deficits.   Data Reviewed: I have personally reviewed following labs and imaging studies  CBC:  Recent Labs Lab 02/27/16 0634 02/28/16 0559  WBC 7.1 9.6  NEUTROABS 5.7  --   HGB 10.5* 10.8*  HCT 32.9* 33.8*  MCV 85.2 85.6  PLT 160 186   Basic Metabolic Panel:  Recent Labs Lab 02/27/16 0634 02/27/16 1038 02/28/16 0559  NA 140  --  138  K 2.9* 3.0* 3.9  CL 106  --  109  CO2 21*  --  18*  GLUCOSE 103*  --  122*  BUN 9  --  5*  CREATININE 0.66  --  0.54  CALCIUM 8.8*  --  8.9  MG  --  1.8  --    GFR: Estimated Creatinine Clearance: 127.4 mL/min (by C-G formula based on Cr of 0.54). Liver Function Tests:  Recent Labs Lab 02/28/16 0559  AST 16  ALT 16  ALKPHOS 47  BILITOT 0.5  PROT 7.2  ALBUMIN 3.0*   No results for input(s): LIPASE, AMYLASE in the last 168 hours. No results for input(s): AMMONIA in the last 168 hours. Coagulation Profile: No results for input(s): INR, PROTIME in the last 168 hours. Cardiac Enzymes: No results for input(s): CKTOTAL, CKMB, CKMBINDEX, TROPONINI in the last 168 hours. BNP (last 3  results) No results for input(s): PROBNP in the last 8760 hours. HbA1C: No results for input(s): HGBA1C in the last 72 hours. CBG: No results for input(s): GLUCAP in the last 168 hours. Lipid Profile: No results for input(s): CHOL, HDL, LDLCALC, TRIG, CHOLHDL, LDLDIRECT in the last 72 hours. Thyroid Function Tests: No results for input(s): TSH, T4TOTAL, FREET4, T3FREE, THYROIDAB in the last 72 hours. Anemia Panel: No results for input(s): VITAMINB12, FOLATE, FERRITIN, TIBC, IRON, RETICCTPCT in the last 72 hours. Urine analysis:    Component Value Date/Time   COLORURINE YELLOW 01/25/2016 0815   APPEARANCEUR CLOUDY* 01/25/2016 0815   LABSPEC 1.012 01/25/2016 0815   PHURINE 5.5 01/25/2016 0815   GLUCOSEU NEGATIVE 01/25/2016 0815   HGBUR SMALL* 01/25/2016 0815   BILIRUBINUR NEGATIVE 01/25/2016 0815   KETONESUR  NEGATIVE 01/25/2016 0815   PROTEINUR 30* 01/25/2016 0815   NITRITE NEGATIVE 01/25/2016 0815   LEUKOCYTESUR MODERATE* 01/25/2016 0815    )No results found for this or any previous visit (from the past 240 hour(s)).    Anti-infectives    Start     Dose/Rate Route Frequency Ordered Stop   02/27/16 0600  azithromycin (ZITHROMAX) tablet 500 mg     500 mg Oral  Once 02/27/16 0557 02/27/16 1610       Radiology Studies: Dg Chest 2 View  02/27/2016  CLINICAL DATA:  Shortness of breath, cough, and chest tightness for 2 days. Nonsmoker. History of asthma. EXAM: CHEST  2 VIEW COMPARISON:  None. FINDINGS: Normal heart size and pulmonary vascularity. Peribronchial thickening and central interstitial prominence consistent with asthma or reactive airways disease. No focal consolidation or airspace disease. No blunting of costophrenic angles. No pneumothorax. Mediastinal contours appear intact. Prominent transverse processes at C7. IMPRESSION: Peribronchial thickening with central interstitial pattern consistent with asthma or reactive airways disease. Electronically Signed   By: Burman Nieves M.D.   On: 02/27/2016 05:30        Scheduled Meds: . enoxaparin (LOVENOX) injection  50 mg Subcutaneous Q24H  . feeding supplement (ENSURE ENLIVE)  237 mL Oral BID BM  . ipratropium  0.5 mg Nebulization QID  . levalbuterol  1.25 mg Nebulization QID  . methylPREDNISolone (SOLU-MEDROL) injection  60 mg Intravenous Q6H  . pneumococcal 23 valent vaccine  0.5 mL Intramuscular Tomorrow-1000  . sodium chloride flush  3 mL Intravenous Q12H  . sodium chloride flush  3 mL Intravenous Q12H   Continuous Infusions:       Time spent: 25 min    JESSICA Juanetta Gosling, DO [facility] Pager [phone]  If 7PM-7AM, please contact night-coverage [email or web address] Password [email or web address] 02/28/2016, 1:34 PM

## 2016-02-29 DIAGNOSIS — J4520 Mild intermittent asthma, uncomplicated: Secondary | ICD-10-CM

## 2016-02-29 MED ORDER — PREDNISONE 10 MG PO TABS: ORAL_TABLET | ORAL | Status: DC

## 2016-02-29 MED ORDER — IPRATROPIUM BROMIDE 0.02 % IN SOLN: 0.5000 mg | Freq: Four times a day (QID) | RESPIRATORY_TRACT | Status: DC

## 2016-02-29 MED ORDER — PREDNISONE 50 MG PO TABS: 60.0000 mg | ORAL_TABLET | Freq: Every day | ORAL | Status: DC

## 2016-02-29 MED ORDER — ALBUTEROL SULFATE (2.5 MG/3ML) 0.083% IN NEBU: 2.5000 mg | INHALATION_SOLUTION | Freq: Four times a day (QID) | RESPIRATORY_TRACT | Status: DC | PRN

## 2016-02-29 NOTE — Progress Notes (Signed)
Nutrition Brief Note  Patient identified on the Malnutrition Screening Tool (MST) Report  Wt Readings from Last 15 Encounters:  02/29/16 229 lb (103.874 kg)  01/25/16 237 lb (107.502 kg)   Mercedes [name] is a 25 y.o. female with a Past Medical History of asthma, who has not seen MD recently, new to [place] from [place] recent months, who presents with worsening sob/doe couple of days. Min cough, sub fevers, only has albuterol mdi at home, using more frequently lately.   Body mass index is 39.29 kg/(m^2). Patient meets criteria for obesity, class II based on current BMI.   Current diet order is regular, patient is consuming approximately 50% of meals at this time. Labs and medications reviewed.   No nutrition interventions warranted at this time. If nutrition issues arise, please consult RD.   Elyon Zoll A. Mayford KnifeWilliams, RD, LDN, CDE Pager: [phone] After hours Pager: [phone]

## 2016-02-29 NOTE — Progress Notes (Signed)
Nsg Discharge Note  Admit Date:  02/27/2016 Discharge date: 02/29/2016   Mercedes PascalShawnte Monne [name] to be D/C'd home per MD order.  AVS completed.  Copy for chart, and copy for patient signed, and dated. Patient/boyfriend able to verbalize understanding.  Discharge Medication:   Medication List    STOP taking these medications        cephALEXin 500 MG capsule  Commonly known as:  KEFLEX     ondansetron 8 MG disintegrating tablet  Commonly known as:  ZOFRAN ODT      TAKE these medications        albuterol (2.5 MG/3ML) 0.083% nebulizer solution  Commonly known as:  PROVENTIL  Take 3 mLs (2.5 mg total) by nebulization every 6 (six) hours as needed for wheezing or shortness of breath.     ipratropium 0.02 % nebulizer solution  Commonly known as:  ATROVENT  Take 2.5 mLs (0.5 mg total) by nebulization 4 (four) times daily.     predniSONE 10 MG tablet  Commonly known as:  DELTASONE  60 mg x 2 days, 50 mg x 2 days, 40 mg x 2 days, 30 mg x 2 days, 8257m g x 2 days, 10 mg x 2 days then stop        Discharge Assessment: Filed Vitals:   02/28/16 2205 02/29/16 0516  BP: 114/60 119/77  Pulse: 78 57  Temp: 98.6 F (37 C) 98.1 F (36.7 C)  Resp: 16 16   Skin clean, dry and intact without evidence of skin break down, no evidence of skin tears noted. IV catheter discontinued with catheter tip intact. Site without signs and symptoms of complications - no redness or edema noted at insertion site, patient denies c/o pain - only slight tenderness at site.  Dressing with slight pressure applied.  D/c Instructions-Education: Discharge instructions given to patient/boyfriend with verbalized understanding. D/c education completed with patient/boyfriend including follow up instructions, medication list, d/c activities limitations if indicated, with other d/c instructions as indicated by MD - patient able to verbalize understanding, all questions fully answered. Patient instructed to return to ED, call  911, or call MD for any changes in condition.  Patient in room getting dressed. RN called Guest services for volunteer to come and escort pt via wheelchair  to emergency room.   Tobin Chadracy Jaxie Racanelli, RN 02/29/2016 1:21 PM

## 2016-02-29 NOTE — Discharge Summary (Signed)
Physician Discharge Summary  Mercedes Mulka ZOX:[id number] DOB: November 10, 1990 DOA: 02/27/2016  PCP: No primary care provider on file.  Admit date: 02/27/2016 Discharge date: 02/29/2016   Recommendations for Outpatient Follow-Up:   No smoking -nebulizer arranged To establish with PCP  Discharge Diagnosis:   Active Problems:   Asthma exacerbation   Anemia   Hypokalemia   Asthma   Discharge disposition:  Home.:  Discharge Condition: Improved.  Diet recommendation: Regular.  Wound care: None.   History of Present Illness:   Mercedes [name] is a 25 y.o. female with a history of asthma, presenting with acute onset of SOB since midnight, and wheezing and cough. Patient used albuterol home inhaler without significant relief. She reports having moved last month from [place] with her 2 small children which may have been a source of stress. Last hospitalization for asthma exacerbation was 2 years ago.  Denies any fevers, chest pain, palpitations, loss of conscioussness, or productive cough.She denies fevers, chills, night sweats, vision changes, or mucositis. Denies any chest pain or palpitations. Denies lower extremity swelling. Denies nausea, heartburn or change in bowel habits. Denies abdominal pain. Appetite is normal. Denies any dysuria. Denies abnormal skin rashes, or neuropathy. Denies any bleeding issues such as epistaxis, hematemesis, hematuria or hematochezia. She has been increasingly tired over the last 2 months. Ambulating without difficulty. Does not smoke but significant other does.    Hospital Course by Problem:   asthma exacerbation, not status asthmaticus currently. -nebs at home -steroid taper improved and anxious to get home to child  hypokalemia - replaced    Medical Consultants:    None.   Discharge Exam:   Filed Vitals:   02/28/16 2205 02/29/16 0516  BP: 114/60 119/77  Pulse: 78 57  Temp: 98.6 F (37 C) 98.1 F (36.7 C)  Resp: 16 16    Filed Vitals:   02/28/16 2205 02/29/16 0345 02/29/16 0516 02/29/16 1142  BP: 114/60  119/77   Pulse: 78  57   Temp: 98.6 F (37 C)  98.1 F (36.7 C)   TempSrc: Oral     Resp: 16  16   Height:      Weight:  103.874 kg (229 lb)    SpO2: 96%  98% 97%    Gen:  NAD Cardiovascular:  RRR, No M/R/G Respiratory: not as tight, mild exp wheeze Gastrointestinal: Abdomen soft, NT/ND with normal active bowel sounds. Extremities: No C/E/C   The results of significant diagnostics from this hospitalization (including imaging, microbiology, ancillary and laboratory) are listed below for reference.     Procedures and Diagnostic Studies:   Dg Chest 2 View  02/27/2016  CLINICAL DATA:  Shortness of breath, cough, and chest tightness for 2 days. Nonsmoker. History of asthma. EXAM: CHEST  2 VIEW COMPARISON:  None. FINDINGS: Normal heart size and pulmonary vascularity. Peribronchial thickening and central interstitial prominence consistent with asthma or reactive airways disease. No focal consolidation or airspace disease. No blunting of costophrenic angles. No pneumothorax. Mediastinal contours appear intact. Prominent transverse processes at C7. IMPRESSION: Peribronchial thickening with central interstitial pattern consistent with asthma or reactive airways disease. Electronically Signed   By: Burman Nieves M.D.   On: 02/27/2016 05:30     Labs:   Basic Metabolic Panel:  Recent Labs Lab 02/27/16 0634 02/27/16 1038 02/28/16 0559  NA 140  --  138  K 2.9* 3.0* 3.9  CL 106  --  109  CO2 21*  --  18*  GLUCOSE 103*  --  122*  BUN 9  --  5*  CREATININE 0.66  --  0.54  CALCIUM 8.8*  --  8.9  MG  --  1.8  --    GFR Estimated Creatinine Clearance: 127.4 mL/min (by C-G formula based on Cr of 0.54). Liver Function Tests:  Recent Labs Lab 02/28/16 0559  AST 16  ALT 16  ALKPHOS 47  BILITOT 0.5  PROT 7.2  ALBUMIN 3.0*   No results for input(s): LIPASE, AMYLASE in the last 168  hours. No results for input(s): AMMONIA in the last 168 hours. Coagulation profile No results for input(s): INR, PROTIME in the last 168 hours.  CBC:  Recent Labs Lab 02/27/16 0634 02/28/16 0559  WBC 7.1 9.6  NEUTROABS 5.7  --   HGB 10.5* 10.8*  HCT 32.9* 33.8*  MCV 85.2 85.6  PLT 160 186   Cardiac Enzymes: No results for input(s): CKTOTAL, CKMB, CKMBINDEX, TROPONINI in the last 168 hours. BNP: Invalid input(s): POCBNP CBG: No results for input(s): GLUCAP in the last 168 hours. D-Dimer No results for input(s): DDIMER in the last 72 hours. Hgb A1c No results for input(s): HGBA1C in the last 72 hours. Lipid Profile No results for input(s): CHOL, HDL, LDLCALC, TRIG, CHOLHDL, LDLDIRECT in the last 72 hours. Thyroid function studies No results for input(s): TSH, T4TOTAL, T3FREE, THYROIDAB in the last 72 hours.  Invalid input(s): FREET3 Anemia work up No results for input(s): VITAMINB12, FOLATE, FERRITIN, TIBC, IRON, RETICCTPCT in the last 72 hours. Microbiology No results found for this or any previous visit (from the past 240 hour(s)).   Discharge Instructions:   Discharge Instructions    Diet general    Complete by:  As directed      Discharge instructions    Complete by:  As directed   Nebulizer machine at home     Increase activity slowly    Complete by:  As directed             Medication List    STOP taking these medications        cephALEXin 500 MG capsule  Commonly known as:  KEFLEX     ondansetron 8 MG disintegrating tablet  Commonly known as:  ZOFRAN ODT      TAKE these medications        albuterol (2.5 MG/3ML) 0.083% nebulizer solution  Commonly known as:  PROVENTIL  Take 3 mLs (2.5 mg total) by nebulization every 6 (six) hours as needed for wheezing or shortness of breath.     ipratropium 0.02 % nebulizer solution  Commonly known as:  ATROVENT  Take 2.5 mLs (0.5 mg total) by nebulization 4 (four) times daily.     predniSONE 10 MG  tablet  Commonly known as:  DELTASONE  60 mg x 2 days, 50 mg x 2 days, 40 mg x 2 days, 30 mg x 2 days, 6268m g x 2 days, 10 mg x 2 days then stop           Follow-up Information    Follow up with [facility].   Contact information:   [address] [place] [id number] [phone]       Time coordinating discharge: 35 min  Signed:  Shadi Larner Juanetta GoslingU Gal Feldhaus   [facility] 02/29/2016, 12:20 PM

## 2016-02-29 NOTE — Discharge Instructions (Signed)
Asthma, Adult Asthma is a recurring condition in which the airways tighten and narrow. Asthma can make it difficult to breathe. It can cause coughing, wheezing, and shortness of breath. Asthma episodes, also called asthma attacks, range from minor to life-threatening. Asthma cannot be cured, but medicines and lifestyle changes can help control it. CAUSES Asthma is believed to be caused by inherited (genetic) and environmental factors, but its exact cause is unknown. Asthma may be triggered by allergens, lung infections, or irritants in the air. Asthma triggers are different for each person. Common triggers include:   Animal dander.  Dust mites.  Cockroaches.  Pollen from trees or grass.  Mold.  Smoke.  Air pollutants such as dust, household cleaners, hair sprays, aerosol sprays, paint fumes, strong chemicals, or strong odors.  Cold air, weather changes, and winds (which increase molds and pollens in the air).  Strong emotional expressions such as crying or laughing hard.  Stress.  Certain medicines (such as aspirin) or types of drugs (such as beta-blockers).  Sulfites in foods and drinks. Foods and drinks that may contain sulfites include dried fruit, potato chips, and sparkling grape juice.  Infections or inflammatory conditions such as the flu, a cold, or an inflammation of the nasal membranes (rhinitis).  Gastroesophageal reflux disease (GERD).  Exercise or strenuous activity. SYMPTOMS Symptoms may occur immediately after asthma is triggered or many hours later. Symptoms include:  Wheezing.  Excessive nighttime or early morning coughing.  Frequent or severe coughing with a common cold.  Chest tightness.  Shortness of breath. DIAGNOSIS  The diagnosis of asthma is made by a review of your medical history and a physical exam. Tests may also be performed. These may include:  Lung function studies. These tests show how much air you breathe in and out.  Allergy  tests.  Imaging tests such as X-rays. TREATMENT  Asthma cannot be cured, but it can usually be controlled. Treatment involves identifying and avoiding your asthma triggers. It also involves medicines. There are 2 classes of medicine used for asthma treatment:   Controller medicines. These prevent asthma symptoms from occurring. They are usually taken every day.  Reliever or rescue medicines. These quickly relieve asthma symptoms. They are used as needed and provide short-term relief. Your health care provider will help you create an asthma action plan. An asthma action plan is a written plan for managing and treating your asthma attacks. It includes a list of your asthma triggers and how they may be avoided. It also includes information on when medicines should be taken and when their dosage should be changed. An action plan may also involve the use of a device called a peak flow meter. A peak flow meter measures how well the lungs are working. It helps you monitor your condition. HOME CARE INSTRUCTIONS   Take medicines only as directed by your health care provider. Speak with your health care provider if you have questions about how or when to take the medicines.  Use a peak flow meter as directed by your health care provider. Record and keep track of readings.  Understand and use the action plan to help minimize or stop an asthma attack without needing to seek medical care.  Control your home environment in the following ways to help prevent asthma attacks:  Do not smoke. Avoid being exposed to secondhand smoke.  Change your heating and air conditioning filter regularly.  Limit your use of fireplaces and wood stoves.  Get rid of pests (such as roaches   and mice) and their droppings.  Throw away plants if you see mold on them.  Clean your floors and dust regularly. Use unscented cleaning products.  Try to have someone else vacuum for you regularly. Stay out of rooms while they are  being vacuumed and for a short while afterward. If you vacuum, use a dust mask from a hardware store, a double-layered or microfilter vacuum cleaner bag, or a vacuum cleaner with a HEPA filter.  Replace carpet with wood, tile, or vinyl flooring. Carpet can trap dander and dust.  Use allergy-proof pillows, mattress covers, and box spring covers.  Wash bed sheets and blankets every week in hot water and dry them in a dryer.  Use blankets that are made of polyester or cotton.  Clean bathrooms and kitchens with bleach. If possible, have someone repaint the walls in these rooms with mold-resistant paint. Keep out of the rooms that are being cleaned and painted.  Wash hands frequently. SEEK MEDICAL CARE IF:   You have wheezing, shortness of breath, or a cough even if taking medicine to prevent attacks.  The colored mucus you cough up (sputum) is thicker than usual.  Your sputum changes from clear or white to yellow, green, gray, or bloody.  You have any problems that may be related to the medicines you are taking (such as a rash, itching, swelling, or trouble breathing).  You are using a reliever medicine more than 2-3 times per week.  Your peak flow is still at 50-79% of your personal best after following your action plan for 1 hour.  You have a fever. SEEK IMMEDIATE MEDICAL CARE IF:   You seem to be getting worse and are unresponsive to treatment during an asthma attack.  You are short of breath even at rest.  You get short of breath when doing very little physical activity.  You have difficulty eating, drinking, or talking due to asthma symptoms.  You develop chest pain.  You develop a fast heartbeat.  You have a bluish color to your lips or fingernails.  You are light-headed, dizzy, or faint.  Your peak flow is less than 50% of your personal best.   This information is not intended to replace advice given to you by your health care provider. Make sure you discuss any  questions you have with your health care provider.   Document Released: 10/10/2005 Document Revised: 07/01/2015 Document Reviewed: 05/09/2013 Elsevier Interactive Patient Education 2016 [employer]vier Inc[employer]  

## 2016-03-04 ENCOUNTER — Inpatient Hospital Stay: Payer: Medicaid Other

## 2016-03-09 ENCOUNTER — Inpatient Hospital Stay: Payer: Medicaid Other | Admitting: Critical Care Medicine

## 2016-03-16 ENCOUNTER — Inpatient Hospital Stay: Payer: Medicaid Other | Admitting: Critical Care Medicine

## 2016-10-24 DIAGNOSIS — N200 Calculus of kidney: Secondary | ICD-10-CM

## 2016-10-24 HISTORY — DX: Calculus of kidney: N20.0

## 2016-10-24 NOTE — L&D Delivery Note (Signed)
Delivery Note At 2:55 PM a viable female was delivered via Vaginal, Spontaneous Delivery (Presentation:ROA). Over an intact perineum.  APGAR:9/9.  3VC x1 loose nuchal, reduced over the head.  Delayed cord clamping.  Placenta status:intact and discarded.   Anesthesia:  epidural Episiotomy: None Lacerations: None Est. Blood Loss (mL): 50 Fundus firm Mom to postpartum.  Baby to Couplet care / Skin to Skin.  Kathlene November, SNM 08/06/2017, 3:05 PM  I was present and supervised this delivery and care.

## 2016-12-12 ENCOUNTER — Emergency Department (HOSPITAL_COMMUNITY)
Admission: EM | Admit: 2016-12-12 | Discharge: 2016-12-12 | Discharge status: Against Medical Advice | Payer: Medicaid Other | Attending: Emergency Medicine | Admitting: Emergency Medicine

## 2016-12-12 ENCOUNTER — Emergency Department (HOSPITAL_COMMUNITY): Payer: Medicaid Other

## 2016-12-12 ENCOUNTER — Encounter (HOSPITAL_COMMUNITY): Payer: Self-pay

## 2016-12-12 DIAGNOSIS — N9489 Other specified conditions associated with female genital organs and menstrual cycle: Secondary | ICD-10-CM | POA: Diagnosis not present

## 2016-12-12 DIAGNOSIS — Z3A01 Less than 8 weeks gestation of pregnancy: Secondary | ICD-10-CM | POA: Insufficient documentation

## 2016-12-12 DIAGNOSIS — N939 Abnormal uterine and vaginal bleeding, unspecified: Secondary | ICD-10-CM | POA: Diagnosis not present

## 2016-12-12 DIAGNOSIS — O009 Unspecified ectopic pregnancy without intrauterine pregnancy: Secondary | ICD-10-CM

## 2016-12-12 DIAGNOSIS — O418X10 Other specified disorders of amniotic fluid and membranes, first trimester, not applicable or unspecified: Secondary | ICD-10-CM

## 2016-12-12 DIAGNOSIS — Z3490 Encounter for supervision of normal pregnancy, unspecified, unspecified trimester: Secondary | ICD-10-CM

## 2016-12-12 DIAGNOSIS — O208 Other hemorrhage in early pregnancy: Secondary | ICD-10-CM | POA: Diagnosis present

## 2016-12-12 DIAGNOSIS — J45909 Unspecified asthma, uncomplicated: Secondary | ICD-10-CM | POA: Diagnosis not present

## 2016-12-12 DIAGNOSIS — O468X1 Other antepartum hemorrhage, first trimester: Secondary | ICD-10-CM

## 2016-12-12 LAB — URINALYSIS, ROUTINE W REFLEX MICROSCOPIC
Bilirubin Urine: NEGATIVE
Glucose, UA: NEGATIVE mg/dL
HGB URINE DIPSTICK: NEGATIVE
Ketones, ur: 5 mg/dL — AB
LEUKOCYTES UA: NEGATIVE
Nitrite: NEGATIVE
Protein, ur: NEGATIVE mg/dL
SPECIFIC GRAVITY, URINE: 1.026 (ref 1.005–1.030)
pH: 5 (ref 5.0–8.0)

## 2016-12-12 LAB — CBC
HCT: 36.9 % (ref 36.0–46.0)
HEMOGLOBIN: 12.5 g/dL (ref 12.0–15.0)
MCH: 28.5 pg (ref 26.0–34.0)
MCHC: 33.9 g/dL (ref 30.0–36.0)
MCV: 84.2 fL (ref 78.0–100.0)
Platelets: 175 10*3/uL (ref 150–400)
RBC: 4.38 MIL/uL (ref 3.87–5.11)
RDW: 13.5 % (ref 11.5–15.5)
WBC: 7.5 10*3/uL (ref 4.0–10.5)

## 2016-12-12 LAB — I-STAT BETA HCG BLOOD, ED (MC, WL, AP ONLY)

## 2016-12-12 LAB — WET PREP, GENITAL
Sperm: NONE SEEN
Trich, Wet Prep: NONE SEEN
Yeast Wet Prep HPF POC: NONE SEEN

## 2016-12-12 LAB — COMPREHENSIVE METABOLIC PANEL
ALK PHOS: 63 U/L (ref 38–126)
ALT: 11 U/L — ABNORMAL LOW (ref 14–54)
AST: 17 U/L (ref 15–41)
Albumin: 4 g/dL (ref 3.5–5.0)
Anion gap: 10 (ref 5–15)
BUN: 6 mg/dL (ref 6–20)
CALCIUM: 9.7 mg/dL (ref 8.9–10.3)
CHLORIDE: 104 mmol/L (ref 101–111)
CO2: 23 mmol/L (ref 22–32)
Creatinine, Ser: 0.62 mg/dL (ref 0.44–1.00)
GFR calc non Af Amer: >60 mL/min (ref >60)
Glucose, Bld: 111 mg/dL — ABNORMAL HIGH (ref 65–99)
Potassium: 3.6 mmol/L (ref 3.5–5.1)
SODIUM: 137 mmol/L (ref 135–145)
Total Bilirubin: 0.7 mg/dL (ref 0.3–1.2)
Total Protein: 7.7 g/dL (ref 6.5–8.1)

## 2016-12-12 LAB — ABO/RH: ABO/RH(D): A POS

## 2016-12-12 LAB — LIPASE, BLOOD: LIPASE: 22 U/L (ref 11–51)

## 2016-12-12 LAB — HCG, QUANTITATIVE, PREGNANCY: HCG, BETA CHAIN, QUANT, S: 28711 m[IU]/mL — AB (ref <5)

## 2016-12-12 MED ORDER — METOCLOPRAMIDE HCL 5 MG/ML IJ SOLN
10.0000 mg | Freq: Once | INTRAMUSCULAR | Status: AC
  Administered 2016-12-12: 10 mg via INTRAVENOUS
  Filled 2016-12-12: qty 2

## 2016-12-12 MED ORDER — SODIUM CHLORIDE 0.9 % IV BOLUS (SEPSIS)
1000.0000 mL | Freq: Once | INTRAVENOUS | Status: AC
  Administered 2016-12-12: 1000 mL via INTRAVENOUS

## 2016-12-12 NOTE — ED Triage Notes (Signed)
Patient complains of lower abdominal pain for several days with vomiting, no diarrhea, thinks she may be pregnant, NAD

## 2016-12-12 NOTE — ED Notes (Signed)
On hourly round found room to be empty with IV catheter intact sitting in the bed.  Patient's belongings not found in room.

## 2016-12-12 NOTE — ED Provider Notes (Signed)
[facility]-EMERGENCY DEPT Provider Note   CSN: [id number] Arrival date & time: 12/12/16  1229     History   Chief Complaint Chief Complaint  Patient presents with  . Abdominal Pain  . Emesis    HPI Mercedes [name] is a 26 y.o. female.  HPI Pt started having diffuse abdominal pain yesterday.   The pain is all over, not once specific spot.  She has had nausea and vomiting and cant keep anything down.  No diarrhea.  No constipation.  No measured fevers, no dysuria.  SHe has felt warm.  LMP Jan 1st.  She took home pregnancy tests.  She was not sure of the results.  She has been pregnant twice before and has two children at home.  Past Medical History:  Diagnosis Date  . Anemia   . Asthma   . Hypokalemia     Patient Active Problem List   Diagnosis Date Noted  . Asthma exacerbation 02/27/2016  . Anemia 02/27/2016  . Hypokalemia 02/27/2016  . Asthma 02/27/2016    Past Surgical History:  Procedure Laterality Date  . WISDOM TOOTH EXTRACTION      OB History    No data available       Home Medications    Prior to Admission medications   Medication Sig Start Date End Date Taking? Authorizing Provider  albuterol (PROVENTIL) (2.5 MG/3ML) 0.083% nebulizer solution Take 3 mLs (2.5 mg total) by nebulization every 6 (six) hours as needed for wheezing or shortness of breath. Patient not taking: Reported on 12/12/2016 02/29/16   Joseph Art, DO  ipratropium (ATROVENT) 0.02 % nebulizer solution Take 2.5 mLs (0.5 mg total) by nebulization 4 (four) times daily. Patient not taking: Reported on 12/12/2016 02/29/16   Joseph Art, DO  predniSONE (DELTASONE) 10 MG tablet 60 mg x 2 days, 50 mg x 2 days, 40 mg x 2 days, 30 mg x 2 days, 72m g x 2 days, 10 mg x 2 days then stop Patient not taking: Reported on 12/12/2016 02/29/16   Joseph Art, DO    Family History Family History  Problem Relation Age of Onset  . Cancer Mother   . Asthma Mother     Social History Social History    Substance Use Topics  . Smoking status: Never Smoker  . Smokeless tobacco: Never Used  . Alcohol use No     Allergies   Penicillins   Review of Systems Review of Systems  All other systems reviewed and are negative.    Physical Exam Updated Vital Signs BP 143/73   Pulse 82   Temp 98.5 F (36.9 C)   Resp 17   SpO2 99%   Physical Exam  Constitutional: She appears well-developed and well-nourished. No distress.  HENT:  Head: Normocephalic and atraumatic.  Right Ear: External ear normal.  Left Ear: External ear normal.  Eyes: Conjunctivae are normal. Right eye exhibits no discharge. Left eye exhibits no discharge. No scleral icterus.  Neck: Neck supple. No tracheal deviation present.  Cardiovascular: Normal rate, regular rhythm and intact distal pulses.   Pulmonary/Chest: Effort normal and breath sounds normal. No stridor. No respiratory distress. She has no wheezes. She has no rales.  Abdominal: Soft. Bowel sounds are normal. She exhibits no distension. There is generalized tenderness. There is no rebound and no guarding.  Genitourinary: Uterus is not tender. Cervix exhibits no discharge and no friability. Right adnexum displays no mass and no tenderness. Left adnexum displays no mass and  no tenderness. No tenderness or bleeding in the vagina. No vaginal discharge found.  Musculoskeletal: She exhibits no edema or tenderness.  Neurological: She is alert. She has normal strength. No cranial nerve deficit (no facial droop, extraocular movements intact, no slurred speech) or sensory deficit. She exhibits normal muscle tone. She displays no seizure activity. Coordination normal.  Skin: Skin is warm and dry. No rash noted.  Psychiatric: She has a normal mood and affect.  Nursing note and vitals reviewed.    ED Treatments / Results  Labs (all labs ordered are listed, but only abnormal results are displayed) Labs Reviewed  WET PREP, GENITAL - Abnormal; Notable for the  following:       Result Value   Clue Cells Wet Prep HPF POC PRESENT (*)    WBC, Wet Prep HPF POC MANY (*)    All other components within normal limits  COMPREHENSIVE METABOLIC PANEL - Abnormal; Notable for the following:    Glucose, Bld 111 (*)    ALT 11 (*)    All other components within normal limits  URINALYSIS, ROUTINE W REFLEX MICROSCOPIC - Abnormal; Notable for the following:    APPearance HAZY (*)    Ketones, ur 5 (*)    All other components within normal limits  HCG, QUANTITATIVE, PREGNANCY - Abnormal; Notable for the following:    hCG, Beta Chain, Quant, S 28,711 (*)    All other components within normal limits  I-STAT BETA HCG BLOOD, ED ([facility], [facility], [facility] ONLY) - Abnormal; Notable for the following:    I-stat hCG, quantitative >2,000.0 (*)    All other components within normal limits  LIPASE, BLOOD  CBC  RPR  HIV ANTIBODY (ROUTINE TESTING)  ABO/RH  GC/CHLAMYDIA PROBE AMP ([place]) NOT AT [facility]   Radiology US Ob Comp < 14 Wks  Result Date: 12/12/2016 CLINICAL DATA:  Abdominal pain for 2 days. EXAM: OBSTETRIC <14 WK [place] AND TRANSVAGINAL OB US TECHNIQUE: Both transabdominal and transvaginal ultrasound examinations were performed for complete evaluation of the gestation as well as the maternal uterus, adnexal regions, and pelvic cul-de-sac. Transvaginal technique was performed to assess early pregnancy. COMPARISON:  None. FINDINGS: Intrauterine gestational sac: Single Yolk sac:  Visualized. Embryo:  Visualized. Cardiac Activity: Visualized. Heart Rate: 108  bpm MSD:   mm    w     d CRL:  3  mm   5 w   6 d                  [place] [facility]: August 08, 2017 Subchorionic hemorrhage: There is a large subchorionic hemorrhage seen inferiorly with fluid in the endometrial canal. Maternal uterus/adnexae: Corpus luteum cyst in the right ovary. The left ovary is normal. Trace fluid in the cul-de-sac is likely physiologic. IMPRESSION: 1. Single live IUP with a large inferior subchorionic hemorrhage and  fluid in the endometrial canal. Electronically Signed   By: Gerome Sam III M.D   On: 12/12/2016 17:23   US Ob Transvaginal  Result Date: 12/12/2016 CLINICAL DATA:  Abdominal pain for 2 days. EXAM: OBSTETRIC <14 WK [place] AND TRANSVAGINAL OB US TECHNIQUE: Both transabdominal and transvaginal ultrasound examinations were performed for complete evaluation of the gestation as well as the maternal uterus, adnexal regions, and pelvic cul-de-sac. Transvaginal technique was performed to assess early pregnancy. COMPARISON:  None. FINDINGS: Intrauterine gestational sac: Single Yolk sac:  Visualized. Embryo:  Visualized. Cardiac Activity: Visualized. Heart Rate: 108  bpm MSD:   mm  w     d CRL:  3  mm   5 w   6 d                  [facility] Subchorionic hemorrhage: There is a large subchorionic hemorrhage seen inferiorly with fluid in the endometrial canal. Maternal uterus/adnexae: Corpus luteum cyst in the right ovary. The left ovary is normal. Trace fluid in the cul-de-sac is likely physiologic. IMPRESSION: 1. Single live IUP with a large inferior subchorionic hemorrhage and fluid in the endometrial canal. Electronically Signed   By: Gerome Samavid  Williams III M.D   On: 12/12/2016 17:23    Procedures Procedures (including critical care time)  Medications Ordered in ED Medications  sodium chloride 0.9 % bolus 1,000 mL (0 mLs Intravenous Stopped 12/12/16 1835)  metoCLOPramide (REGLAN) injection 10 mg (10 mg Intravenous Given 12/12/16 1739)     Initial Impression / Assessment and Plan / ED Course  I have reviewed the triage vital signs and the nursing notes.  Pertinent labs & imaging results that were available during my care of the patient were reviewed by me and considered in my medical decision making (see chart for details).   Pt presented with vomiting and abdominal pain.   Newly diagnosed pregnancy here.  Pelvic exam without any bleeding.  US performed shows an IUP without an ectopic.      Pt left the ED before I was able to tell her US results.   Fortunately, no worrisome findings.  Pt can follow up with OB GYN as an outpatient.  She does not need to be called back to the ED  Final Clinical Impressions(s) / ED Diagnoses   Final diagnoses:  Vaginal bleeding  Ectopic pregnancy  Intrauterine pregnancy  Subchorionic hemorrhage of placenta in first trimester, single or unspecified fetus      Linwood DibblesJon Maliea Grandmaison, MD 12/12/16 1842

## 2016-12-13 LAB — HIV ANTIBODY (ROUTINE TESTING W REFLEX): HIV Screen 4th Generation wRfx: NONREACTIVE

## 2016-12-13 LAB — GC/CHLAMYDIA PROBE AMP (~~LOC~~) NOT AT ARMC
Chlamydia: NEGATIVE
NEISSERIA GONORRHEA: NEGATIVE

## 2016-12-13 LAB — RPR: RPR Ser Ql: NONREACTIVE

## 2016-12-14 ENCOUNTER — Encounter (HOSPITAL_COMMUNITY): Payer: Self-pay | Admitting: *Deleted

## 2016-12-14 ENCOUNTER — Emergency Department (HOSPITAL_COMMUNITY)
Admission: EM | Admit: 2016-12-14 | Discharge: 2016-12-14 | Discharge status: Against Medical Advice | Payer: Medicaid Other | Attending: Emergency Medicine | Admitting: Emergency Medicine

## 2016-12-14 DIAGNOSIS — O209 Hemorrhage in early pregnancy, unspecified: Secondary | ICD-10-CM | POA: Insufficient documentation

## 2016-12-14 DIAGNOSIS — O219 Vomiting of pregnancy, unspecified: Secondary | ICD-10-CM | POA: Diagnosis not present

## 2016-12-14 DIAGNOSIS — J45909 Unspecified asthma, uncomplicated: Secondary | ICD-10-CM | POA: Diagnosis not present

## 2016-12-14 DIAGNOSIS — O4690 Antepartum hemorrhage, unspecified, unspecified trimester: Secondary | ICD-10-CM

## 2016-12-14 DIAGNOSIS — Z3A01 Less than 8 weeks gestation of pregnancy: Secondary | ICD-10-CM | POA: Insufficient documentation

## 2016-12-14 DIAGNOSIS — Z79899 Other long term (current) drug therapy: Secondary | ICD-10-CM | POA: Insufficient documentation

## 2016-12-14 LAB — COMPREHENSIVE METABOLIC PANEL
ALK PHOS: 61 U/L (ref 38–126)
ALT: 11 U/L — AB (ref 14–54)
AST: 18 U/L (ref 15–41)
Albumin: 3.8 g/dL (ref 3.5–5.0)
Anion gap: 12 (ref 5–15)
BUN: 10 mg/dL (ref 6–20)
CO2: 24 mmol/L (ref 22–32)
CREATININE: 0.63 mg/dL (ref 0.44–1.00)
Calcium: 9.4 mg/dL (ref 8.9–10.3)
Chloride: 102 mmol/L (ref 101–111)
Glucose, Bld: 85 mg/dL (ref 65–99)
Potassium: 3.2 mmol/L — ABNORMAL LOW (ref 3.5–5.1)
SODIUM: 138 mmol/L (ref 135–145)
TOTAL PROTEIN: 7.2 g/dL (ref 6.5–8.1)
Total Bilirubin: 1 mg/dL (ref 0.3–1.2)

## 2016-12-14 LAB — URINALYSIS, ROUTINE W REFLEX MICROSCOPIC
Bacteria, UA: NONE SEEN
Bilirubin Urine: NEGATIVE
GLUCOSE, UA: NEGATIVE mg/dL
HGB URINE DIPSTICK: NEGATIVE
KETONES UR: 20 mg/dL — AB
Nitrite: NEGATIVE
PH: 5 (ref 5.0–8.0)
PROTEIN: 30 mg/dL — AB
Specific Gravity, Urine: 1.032 — ABNORMAL HIGH (ref 1.005–1.030)

## 2016-12-14 LAB — CBC
HCT: 35.4 % — ABNORMAL LOW (ref 36.0–46.0)
HEMOGLOBIN: 11.8 g/dL — AB (ref 12.0–15.0)
MCH: 28.3 pg (ref 26.0–34.0)
MCHC: 33.3 g/dL (ref 30.0–36.0)
MCV: 84.9 fL (ref 78.0–100.0)
PLATELETS: 169 10*3/uL (ref 150–400)
RBC: 4.17 MIL/uL (ref 3.87–5.11)
RDW: 13.8 % (ref 11.5–15.5)
WBC: 7.5 10*3/uL (ref 4.0–10.5)

## 2016-12-14 LAB — I-STAT BETA HCG BLOOD, ED (MC, WL, AP ONLY): I-stat hCG, quantitative: >2000 m[IU]/mL — ABNORMAL HIGH (ref <5)

## 2016-12-14 MED ORDER — METOCLOPRAMIDE HCL 5 MG/ML IJ SOLN
10.0000 mg | Freq: Once | INTRAMUSCULAR | Status: AC
  Administered 2016-12-14: 10 mg via INTRAVENOUS
  Filled 2016-12-14: qty 2

## 2016-12-14 MED ORDER — SODIUM CHLORIDE 0.9 % IV BOLUS (SEPSIS)
1000.0000 mL | Freq: Once | INTRAVENOUS | Status: AC
  Administered 2016-12-14: 1000 mL via INTRAVENOUS

## 2016-12-14 NOTE — ED Notes (Signed)
IV team at bedside 

## 2016-12-14 NOTE — ED Provider Notes (Signed)
[facility]PT Provider Note   CSN: [id number] Arrival date & time: 12/14/16  [id number]     History   Chief Complaint Chief Complaint  Patient presents with  . Vaginal Bleeding    HPI Margan Lorraine LaxMonne Manson PasseyBrown is a 26 y.o. female.  HPI   Patient is a 26 year old female with history of asthma who presents the ED with complaint of abdominal pain and vaginal bleeding. Patient reports she has had intermittent lower abdominal cramping with associated nausea and multiple episodes of NBNB vomiting for the past 4 days. She reports having more than 10 episodes of vomiting daily and states she has been unable to keep anything down. Patient reports last night she began having light vaginal bleeding and reports passing one small clot. Denies any passage of products/tissue. Patient denies taking any medications for her symptoms. She reports being evaluated in the ED on 12/12/16 where she had a positive pregnancy test and confirmed IUP on ultrasound. Patient denies following up with OB/GYN. Denies fever, chills, cough, shortness of breath, chest pain, wheezing, hematemesis, diarrhea, constipation, urinary symptoms, vaginal discharge, lightheadedness, syncope. G3P2A0. Denies history of abdominal surgeries. LMP 10/28/16.  Past Medical History:  Diagnosis Date  . Anemia   . Asthma   . Hypokalemia     Patient Active Problem List   Diagnosis Date Noted  . Asthma exacerbation 02/27/2016  . Anemia 02/27/2016  . Hypokalemia 02/27/2016  . Asthma 02/27/2016    Past Surgical History:  Procedure Laterality Date  . WISDOM TOOTH EXTRACTION      OB History    No data available       Home Medications    Prior to Admission medications   Medication Sig Start Date End Date Taking? Authorizing Provider  albuterol (PROVENTIL) (2.5 MG/3ML) 0.083% nebulizer solution Take 3 mLs (2.5 mg total) by nebulization every 6 (six) hours as needed for wheezing or shortness of breath. Patient not taking: Reported on  12/12/2016 02/29/16   Joseph ArtJessica U Vann, DO  ipratropium (ATROVENT) 0.02 % nebulizer solution Take 2.5 mLs (0.5 mg total) by nebulization 4 (four) times daily. Patient not taking: Reported on 12/12/2016 02/29/16   Joseph ArtJessica U Vann, DO  predniSONE (DELTASONE) 10 MG tablet 60 mg x 2 days, 50 mg x 2 days, 40 mg x 2 days, 30 mg x 2 days, 4179m g x 2 days, 10 mg x 2 days then stop Patient not taking: Reported on 12/12/2016 02/29/16   Joseph ArtJessica U Vann, DO    Family History Family History  Problem Relation Age of Onset  . Cancer Mother   . Asthma Mother     Social History Social History  Substance Use Topics  . Smoking status: Never Smoker  . Smokeless tobacco: Never Used  . Alcohol use No     Allergies   Penicillins   Review of Systems Review of Systems  Gastrointestinal: Positive for abdominal pain, nausea and vomiting.  Genitourinary: Positive for vaginal bleeding.  All other systems reviewed and are negative.    Physical Exam Updated Vital Signs BP 118/66 (BP Location: Left Arm)   Pulse 74   Temp 99.1 F (37.3 C) (Oral)   Resp 12   Ht 5\' 4"  (1.626 m)   Wt 111.1 kg   LMP 10/28/2016   SpO2 100%   BMI 42.05 kg/m   Physical Exam  Constitutional: She is oriented to person, place, and time. She appears well-developed and well-nourished. No distress.  HENT:  Head: Normocephalic and atraumatic.  Mouth/Throat: Uvula  is midline and oropharynx is clear and moist. Mucous membranes are dry. No oropharyngeal exudate, posterior oropharyngeal edema, posterior oropharyngeal erythema or tonsillar abscesses. No tonsillar exudate.  Eyes: Conjunctivae and EOM are normal. Right eye exhibits no discharge. Left eye exhibits no discharge. No scleral icterus.  Neck: Normal range of motion. Neck supple.  Cardiovascular: Normal rate, regular rhythm, normal heart sounds and intact distal pulses.   Pulmonary/Chest: Effort normal and breath sounds normal. No respiratory distress. She has no wheezes. She has no  rales. She exhibits no tenderness.  Abdominal: Soft. Bowel sounds are normal. She exhibits no distension and no mass. There is tenderness. There is no rebound and no guarding. No hernia.  Diffuse abdominal tenderness, worse over lower quadrants.  Musculoskeletal: Normal range of motion. She exhibits no edema.  Neurological: She is alert and oriented to person, place, and time.  Skin: Skin is warm and dry. She is not diaphoretic.  Nursing note and vitals reviewed.    ED Treatments / Results  Labs (all labs ordered are listed, but only abnormal results are displayed) Labs Reviewed  COMPREHENSIVE METABOLIC PANEL - Abnormal; Notable for the following:       Result Value   Potassium 3.2 (*)    ALT 11 (*)    All other components within normal limits  CBC - Abnormal; Notable for the following:    Hemoglobin 11.8 (*)    HCT 35.4 (*)    All other components within normal limits  URINALYSIS, ROUTINE W REFLEX MICROSCOPIC - Abnormal; Notable for the following:    Color, Urine AMBER (*)    APPearance HAZY (*)    Specific Gravity, Urine 1.032 (*)    Ketones, ur 20 (*)    Protein, ur 30 (*)    Leukocytes, UA LARGE (*)    Squamous Epithelial / LPF 6-30 (*)    All other components within normal limits  I-STAT BETA HCG BLOOD, ED ([facility], [facility], [facility] ONLY) - Abnormal; Notable for the following:    I-stat hCG, quantitative >2,000.0 (*)    All other components within normal limits    EKG  EKG Interpretation None       Radiology US Ob Comp < 14 Wks  Result Date: 12/12/2016 CLINICAL DATA:  Abdominal pain for 2 days. EXAM: OBSTETRIC <14 WK [place] AND TRANSVAGINAL OB US TECHNIQUE: Both transabdominal and transvaginal ultrasound examinations were performed for complete evaluation of the gestation as well as the maternal uterus, adnexal regions, and pelvic cul-de-sac. Transvaginal technique was performed to assess early pregnancy. COMPARISON:  None. FINDINGS: Intrauterine gestational sac: Single Yolk sac:   Visualized. Embryo:  Visualized. Cardiac Activity: Visualized. Heart Rate: 108  bpm MSD:   mm    w     d CRL:  3  mm   5 w   6 d                  [place] [facility]: August 08, 2017 Subchorionic hemorrhage: There is a large subchorionic hemorrhage seen inferiorly with fluid in the endometrial canal. Maternal uterus/adnexae: Corpus luteum cyst in the right ovary. The left ovary is normal. Trace fluid in the cul-de-sac is likely physiologic. IMPRESSION: 1. Single live IUP with a large inferior subchorionic hemorrhage and fluid in the endometrial canal. Electronically Signed   By: Gerome Sam III M.D   On: 12/12/2016 17:23   US Ob Transvaginal  Result Date: 12/12/2016 CLINICAL DATA:  Abdominal pain for 2 days. EXAM: OBSTETRIC <14 WK [place]  AND TRANSVAGINAL OB US TECHNIQUE: Both transabdominal and transvaginal ultrasound examinations were performed for complete evaluation of the gestation as well as the maternal uterus, adnexal regions, and pelvic cul-de-sac. Transvaginal technique was performed to assess early pregnancy. COMPARISON:  None. FINDINGS: Intrauterine gestational sac: Single Yolk sac:  Visualized. Embryo:  Visualized. Cardiac Activity: Visualized. Heart Rate: 108  bpm MSD:   mm    w     d CRL:  3  mm   5 w   6 d                  [place] [facility]: August 08, 2017 Subchorionic hemorrhage: There is a large subchorionic hemorrhage seen inferiorly with fluid in the endometrial canal. Maternal uterus/adnexae: Corpus luteum cyst in the right ovary. The left ovary is normal. Trace fluid in the cul-de-sac is likely physiologic. IMPRESSION: 1. Single live IUP with a large inferior subchorionic hemorrhage and fluid in the endometrial canal. Electronically Signed   By: Gerome Sam III M.D   On: 12/12/2016 17:23    Procedures Procedures (including critical care time)  Medications Ordered in ED Medications  sodium chloride 0.9 % bolus 1,000 mL (0 mLs Intravenous Stopped 12/14/16 1309)  metoCLOPramide (REGLAN) injection 10 mg  (10 mg Intravenous Given 12/14/16 1252)     Initial Impression / Assessment and Plan / ED Course  I have reviewed the triage vital signs and the nursing notes.  Pertinent labs & imaging results that were available during my care of the patient were reviewed by me and considered in my medical decision making (see chart for details).     Patient presents with lower abdominal cramping, nausea and vomiting that of them present for the past 4 days and vaginal bleeding that started last night. Patient reports currently being pregnant which was confirmed on ultrasound in the ED 2 days ago. G3P2A0. VSS. Exam revealed mild diffuse abdominal tenderness, worse over lower quadrants, no peritoneal signs. Dry mucous membranes. Lungs clear to auscultation bilaterally. Will give pt IVF, antiemetics.   Chart review shows patient was seen in the ED on 12/12/16 for abdominal pain with nausea and vomiting. Pelvic exam without any bleeding. Ultrasound performed shows IUP without any ectopic, large inferior subchorionic hemorrhage and fluid in the endometrial canal reported. Patient left the ED prior to receiving her ultrasound results.  Labs showed potassium 3.2. UA positive for ketones likely from dehydration and large leuks, 6-30 WBCs and 6-30 epithelial cells which appears to be due to contamination. Will send urine culture. Hgb 11.8. Plan to order Pelvic US for reevaluation of IUP due to pt with continued abdominal pain and new onset vaginal bleeding, concern for miscarriage.   1:13 PM - I was notified by nurse that pt eloped prior to having her US performed due to her daughter having an asthma attack.   Final Clinical Impressions(s) / ED Diagnoses   Final diagnoses:  Vaginal bleeding in pregnancy    New Prescriptions New Prescriptions   No medications on file     Barrett Henle, [place] 12/14/16 1313    Marily Memos, MD 12/14/16 1459

## 2016-12-14 NOTE — ED Notes (Addendum)
Patient was seen running out door.  When confronted why she was leaving, she said that her daughter is having an asthma attack and she had to leave.  Pt pulled out IV line and left it hanging while fluids continued to run onto floor. Patient left AMA.

## 2016-12-14 NOTE — ED Triage Notes (Signed)
Pt reports abdominal pain and vaginal bleeding starting yesterday. Pt reports LMP was 10/28/16. Pr reports positive preg tests.

## 2017-02-07 IMAGING — US US ABDOMEN COMPLETE
1 series · 14 of 25 positions shown · non-contrast
Comparison: None.

CLINICAL DATA: Epigastric pain with nausea and vomiting after
wisdom tooth surgery last week.

EXAM:
ABDOMEN ULTRASOUND COMPLETE

[Series 1: us abdomen complete · 0.22mm/px · 14 of 74 slices shown]
[im 1/74]
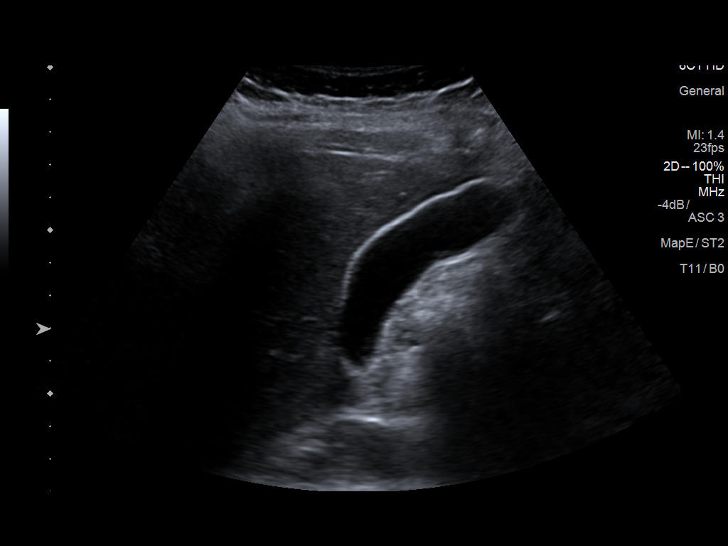
[im 7/74]
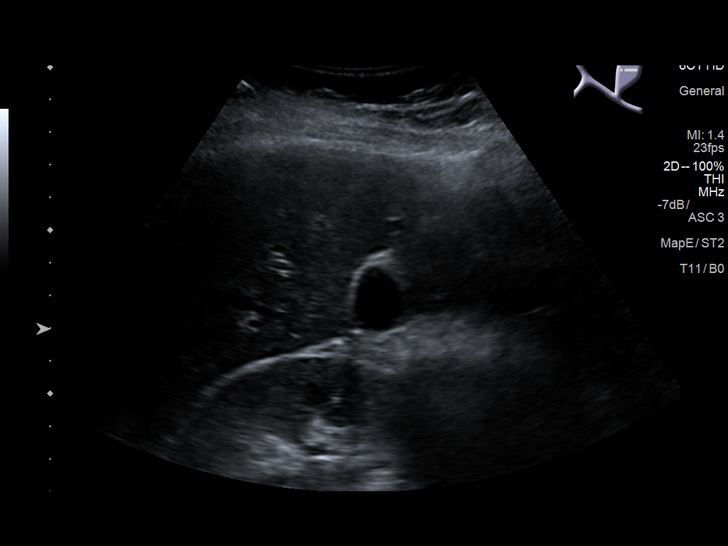
[im 13/74]
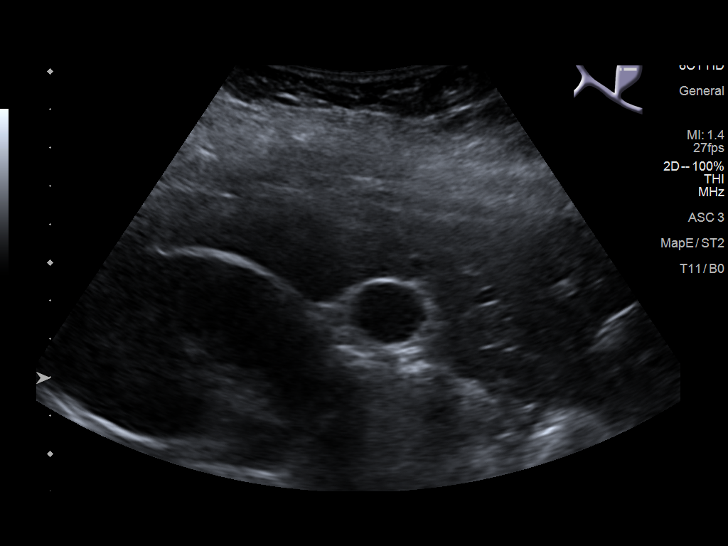
[im 19/74]
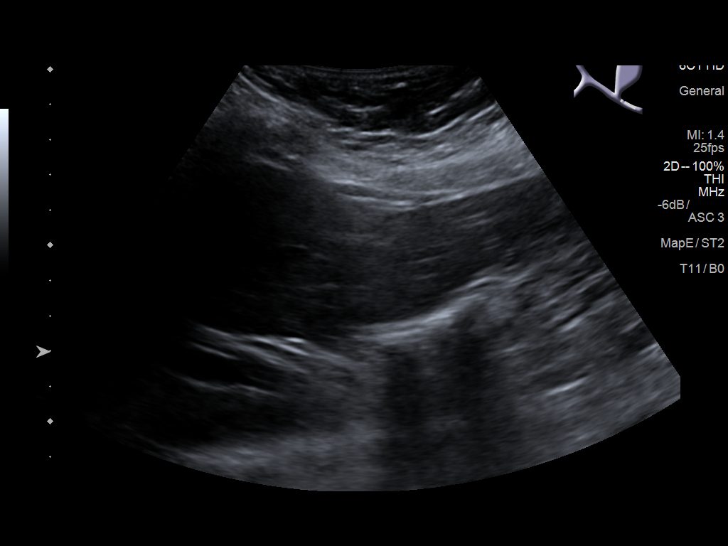
[im 25/74]
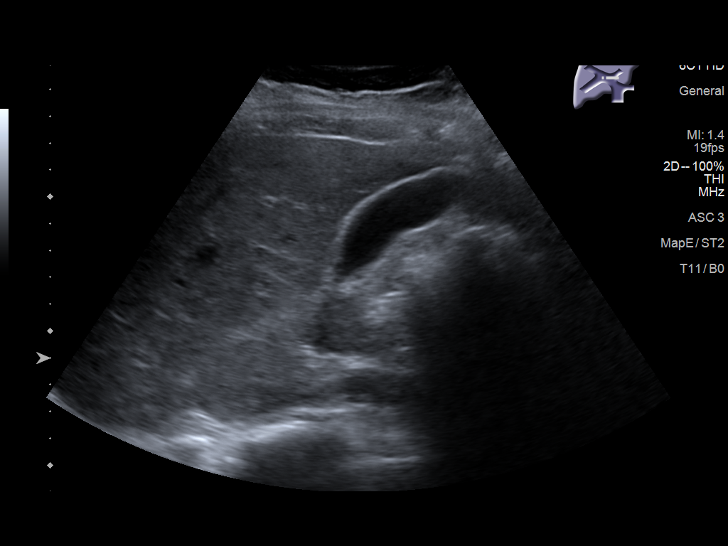
[im 28/74]
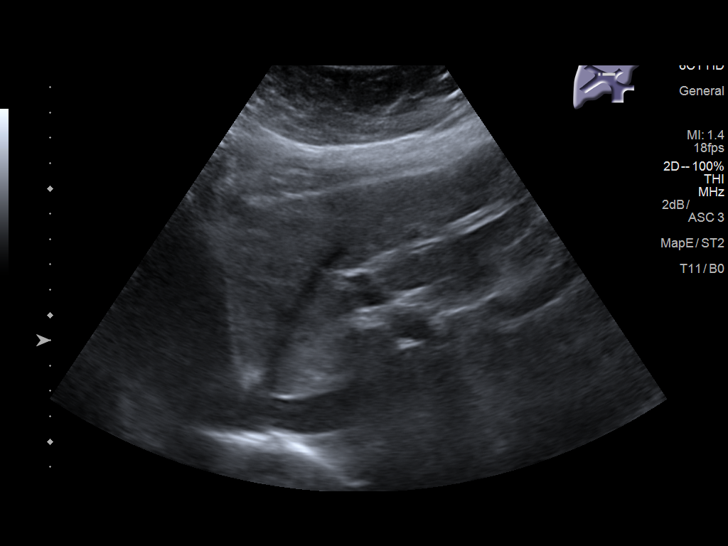
[im 34/74]
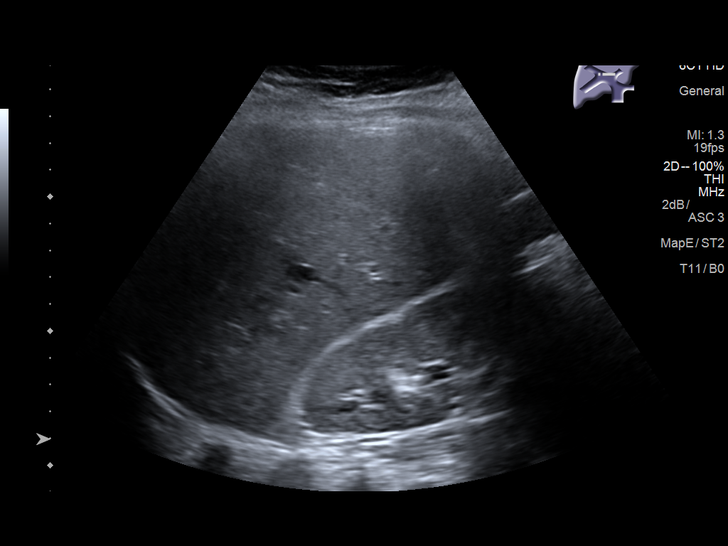
[im 40/74]
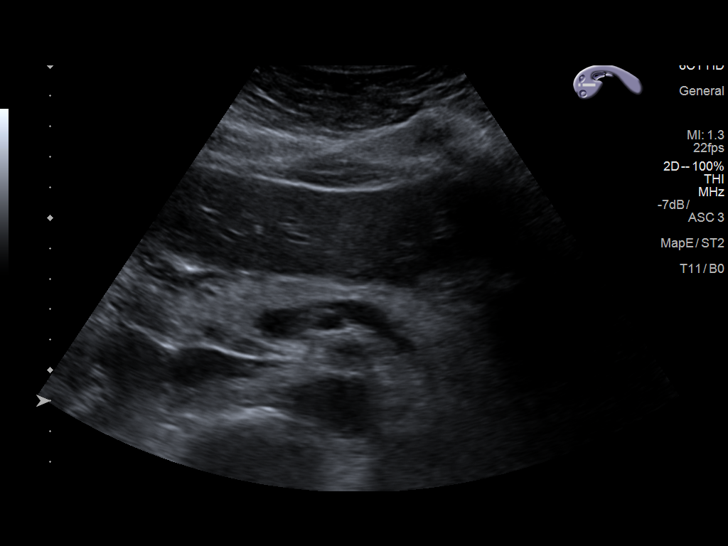
[im 46/74]
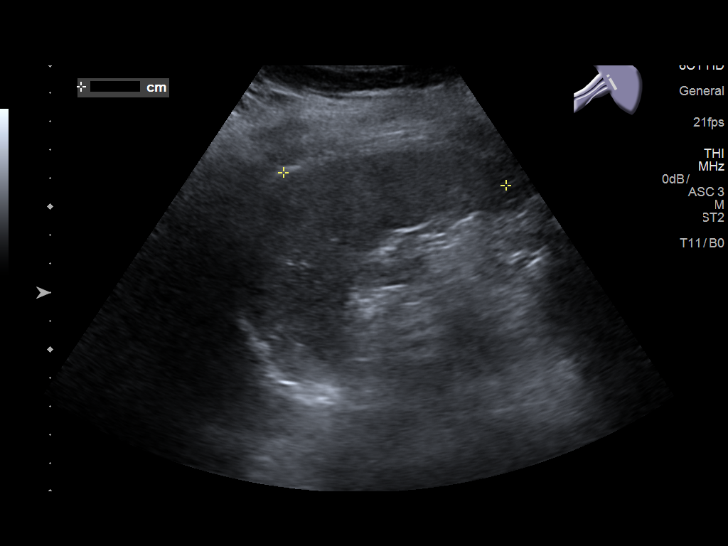
[im 49/74]
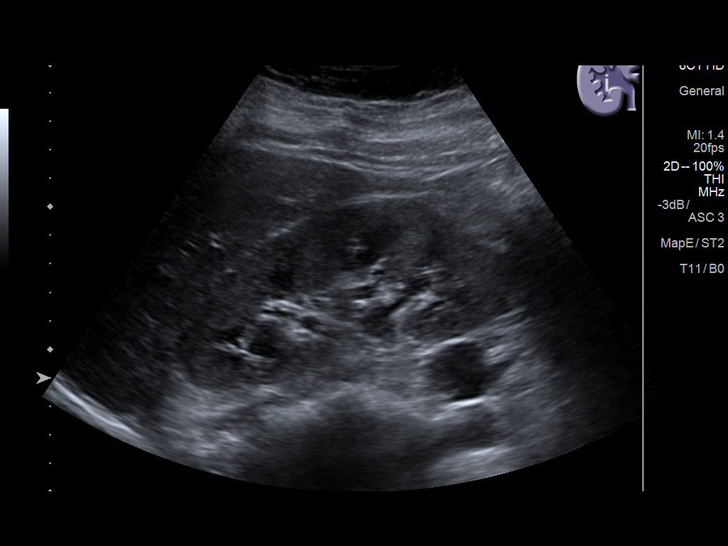
[im 55/74]
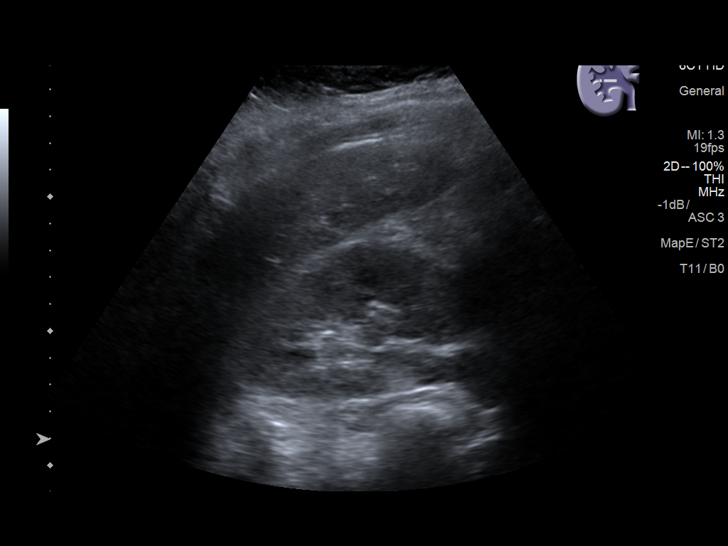
[im 61/74]
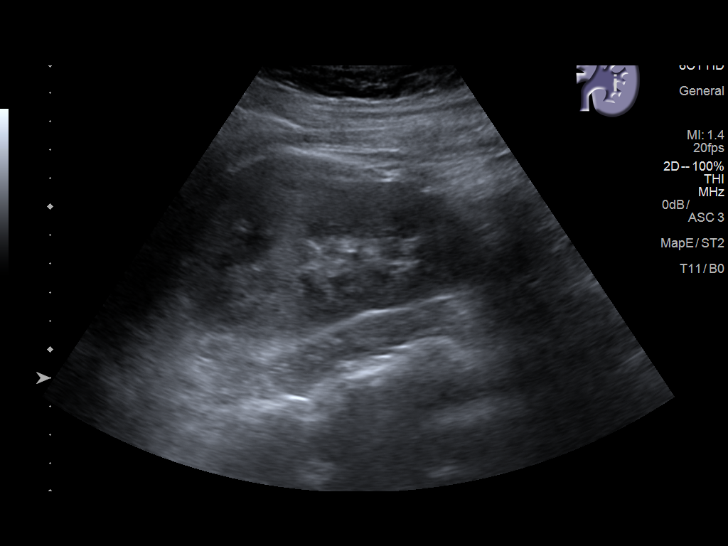
[im 67/74]
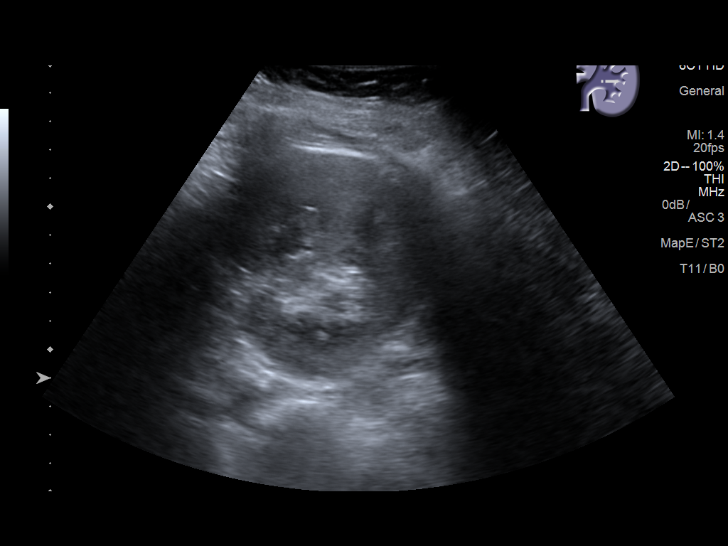
[im 74/74]
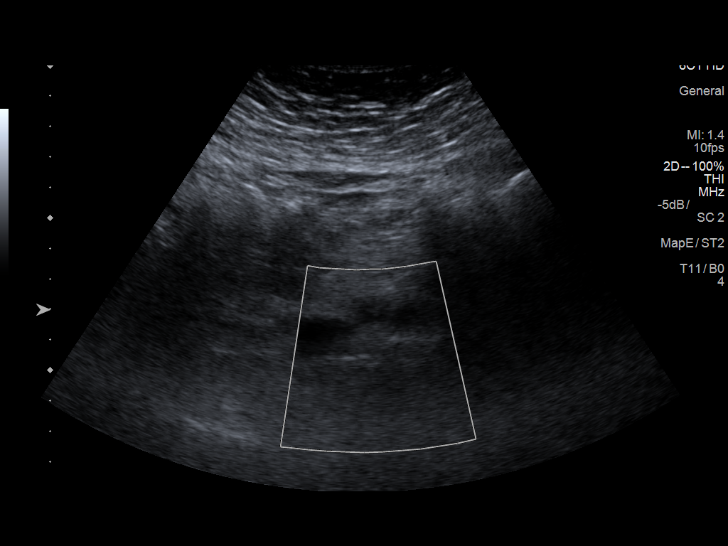

[14 of 25 positions shown; findings below may reference images not displayed]

FINDINGS: Gallbladder: No gallstones or wall thickening visualized. No
sonographic Murphy sign noted by sonographer.

Common bile duct: Diameter: 3 mm

Liver: No focal lesion identified. Within normal limits in
parenchymal echogenicity.

IVC: No abnormality visualized.

Pancreas: Visualized portion unremarkable.

Spleen: Size and appearance within normal limits.

Right Kidney: Length: 13.0 cm. Echogenicity within normal limits. No
mass or hydronephrosis visualized.

Left Kidney: Length: 12.2 cm. Echogenicity within normal limits. No
mass or hydronephrosis visualized.

Abdominal aorta: No aneurysm visualized.

Other findings: None.
IMPRESSION: Unremarkable abdominal ultrasound.

## 2017-03-17 ENCOUNTER — Encounter (HOSPITAL_COMMUNITY): Payer: Self-pay

## 2017-03-17 ENCOUNTER — Emergency Department (HOSPITAL_COMMUNITY): Payer: Medicaid Other

## 2017-03-17 ENCOUNTER — Emergency Department (HOSPITAL_COMMUNITY)
Admission: EM | Admit: 2017-03-17 | Discharge: 2017-03-17 | Discharge status: Against Medical Advice | Payer: Medicaid Other | Attending: Emergency Medicine | Admitting: Emergency Medicine

## 2017-03-17 DIAGNOSIS — E876 Hypokalemia: Secondary | ICD-10-CM | POA: Insufficient documentation

## 2017-03-17 DIAGNOSIS — R1084 Generalized abdominal pain: Secondary | ICD-10-CM | POA: Diagnosis not present

## 2017-03-17 DIAGNOSIS — O9989 Other specified diseases and conditions complicating pregnancy, childbirth and the puerperium: Secondary | ICD-10-CM | POA: Insufficient documentation

## 2017-03-17 DIAGNOSIS — O99512 Diseases of the respiratory system complicating pregnancy, second trimester: Secondary | ICD-10-CM | POA: Insufficient documentation

## 2017-03-17 DIAGNOSIS — R112 Nausea with vomiting, unspecified: Secondary | ICD-10-CM | POA: Diagnosis not present

## 2017-03-17 DIAGNOSIS — J45909 Unspecified asthma, uncomplicated: Secondary | ICD-10-CM | POA: Insufficient documentation

## 2017-03-17 DIAGNOSIS — N3001 Acute cystitis with hematuria: Secondary | ICD-10-CM

## 2017-03-17 DIAGNOSIS — O210 Mild hyperemesis gravidarum: Secondary | ICD-10-CM

## 2017-03-17 DIAGNOSIS — Z3A20 20 weeks gestation of pregnancy: Secondary | ICD-10-CM | POA: Insufficient documentation

## 2017-03-17 LAB — URINALYSIS, ROUTINE W REFLEX MICROSCOPIC
Bilirubin Urine: NEGATIVE
GLUCOSE, UA: NEGATIVE mg/dL
KETONES UR: 80 mg/dL — AB
NITRITE: NEGATIVE
PH: 6 (ref 5.0–8.0)
PROTEIN: 30 mg/dL — AB
Specific Gravity, Urine: 1.02 (ref 1.005–1.030)

## 2017-03-17 LAB — COMPREHENSIVE METABOLIC PANEL
ALBUMIN: 2.9 g/dL — AB (ref 3.5–5.0)
ALK PHOS: 61 U/L (ref 38–126)
ALT: 8 U/L — ABNORMAL LOW (ref 14–54)
ANION GAP: 9 (ref 5–15)
AST: 14 U/L — ABNORMAL LOW (ref 15–41)
BUN: <5 mg/dL — ABNORMAL LOW (ref 6–20)
CALCIUM: 8.8 mg/dL — AB (ref 8.9–10.3)
CO2: 25 mmol/L (ref 22–32)
Chloride: 101 mmol/L (ref 101–111)
Creatinine, Ser: 0.5 mg/dL (ref 0.44–1.00)
GFR calc Af Amer: >60 mL/min (ref >60)
GFR calc non Af Amer: >60 mL/min (ref >60)
GLUCOSE: 78 mg/dL (ref 65–99)
POTASSIUM: 2.6 mmol/L — AB (ref 3.5–5.1)
SODIUM: 135 mmol/L (ref 135–145)
TOTAL PROTEIN: 6.1 g/dL — AB (ref 6.5–8.1)
Total Bilirubin: 0.7 mg/dL (ref 0.3–1.2)

## 2017-03-17 LAB — LIPASE, BLOOD: Lipase: 18 U/L (ref 11–51)

## 2017-03-17 LAB — CBC
HEMATOCRIT: 30.6 % — AB (ref 36.0–46.0)
HEMOGLOBIN: 10.3 g/dL — AB (ref 12.0–15.0)
MCH: 29.4 pg (ref 26.0–34.0)
MCHC: 33.7 g/dL (ref 30.0–36.0)
MCV: 87.4 fL (ref 78.0–100.0)
Platelets: 159 10*3/uL (ref 150–400)
RBC: 3.5 MIL/uL — AB (ref 3.87–5.11)
RDW: 14 % (ref 11.5–15.5)
WBC: 7.8 10*3/uL (ref 4.0–10.5)

## 2017-03-17 LAB — HCG, QUANTITATIVE, PREGNANCY: hCG, Beta Chain, Quant, S: 14363 m[IU]/mL — ABNORMAL HIGH (ref <5)

## 2017-03-17 LAB — I-STAT BETA HCG BLOOD, ED (MC, WL, AP ONLY): I-stat hCG, quantitative: >2000 m[IU]/mL — ABNORMAL HIGH (ref <5)

## 2017-03-17 MED ORDER — CEPHALEXIN 500 MG PO CAPS: 500.0000 mg | ORAL_CAPSULE | Freq: Three times a day (TID) | ORAL | 0 refills | Status: DC

## 2017-03-17 MED ORDER — SODIUM CHLORIDE 0.9 % IV BOLUS (SEPSIS)
1000.0000 mL | Freq: Once | INTRAVENOUS | Status: AC
  Administered 2017-03-17: 1000 mL via INTRAVENOUS

## 2017-03-17 MED ORDER — DEXTROSE 5 % IV SOLN: 1.0000 g | Freq: Once | INTRAVENOUS | Status: DC

## 2017-03-17 MED ORDER — POTASSIUM CHLORIDE 10 MEQ/100ML IV SOLN: 10.0000 meq | Freq: Once | INTRAVENOUS | Status: DC

## 2017-03-17 MED ORDER — POTASSIUM CHLORIDE CRYS ER 20 MEQ PO TBCR
40.0000 meq | EXTENDED_RELEASE_TABLET | Freq: Once | ORAL | Status: AC
  Administered 2017-03-17: 40 meq via ORAL
  Filled 2017-03-17: qty 2

## 2017-03-17 MED ORDER — PROCHLORPERAZINE MALEATE 10 MG PO TABS: 10.0000 mg | ORAL_TABLET | Freq: Three times a day (TID) | ORAL | 0 refills | Status: DC | PRN

## 2017-03-17 MED ORDER — POTASSIUM CHLORIDE CRYS ER 20 MEQ PO TBCR: 20.0000 meq | EXTENDED_RELEASE_TABLET | Freq: Every day | ORAL | 0 refills | Status: DC

## 2017-03-17 MED ORDER — DIPHENHYDRAMINE HCL 50 MG/ML IJ SOLN
12.5000 mg | Freq: Once | INTRAMUSCULAR | Status: AC
  Administered 2017-03-17: 12.5 mg via INTRAVENOUS
  Filled 2017-03-17: qty 1

## 2017-03-17 MED ORDER — METOCLOPRAMIDE HCL 10 MG PO TABS: 10.0000 mg | ORAL_TABLET | Freq: Four times a day (QID) | ORAL | 0 refills | Status: DC | PRN

## 2017-03-17 MED ORDER — POTASSIUM CHLORIDE 10 MEQ/100ML IV SOLN: 10.0000 meq | INTRAVENOUS | Status: DC

## 2017-03-17 MED ORDER — LACTATED RINGERS IV BOLUS (SEPSIS): 1000.0000 mL | Freq: Once | INTRAVENOUS | Status: DC

## 2017-03-17 MED ORDER — CEPHALEXIN 250 MG PO CAPS
500.0000 mg | ORAL_CAPSULE | Freq: Once | ORAL | Status: AC
  Administered 2017-03-17: 500 mg via ORAL
  Filled 2017-03-17: qty 2

## 2017-03-17 MED ORDER — METOCLOPRAMIDE HCL 5 MG/ML IJ SOLN
10.0000 mg | Freq: Once | INTRAMUSCULAR | Status: AC
  Administered 2017-03-17: 10 mg via INTRAVENOUS
  Filled 2017-03-17: qty 2

## 2017-03-17 MED ORDER — PROCHLORPERAZINE EDISYLATE 5 MG/ML IJ SOLN
10.0000 mg | Freq: Once | INTRAMUSCULAR | Status: AC
  Administered 2017-03-17: 10 mg via INTRAVENOUS
  Filled 2017-03-17: qty 2

## 2017-03-17 NOTE — ED Triage Notes (Signed)
Pt reports she is [redacted] weeks pregnant with severe abdominal cramping, onset three days ago. She denies vaginal bleeding or discharge. She was [place] with [place] yesterday in [place]. She endorses nausea and vomiting, denies diarrhea.

## 2017-03-17 NOTE — ED Provider Notes (Signed)
[facility] Provider Note   CSN: [id number] Arrival date & time: 03/17/17  1424     History   Chief Complaint Chief Complaint  Patient presents with  . Abdominal Pain    HPI Mercedes [name] is a 26 y.o. female G5P2 at 20 weeks here presenting with nausea vomiting, abdominal pain. Patient was seen at [facility] 2 days ago for similar symptoms and was diagnosed with gastroenteritis. She states that she was told she had a kidney stone but there was no renal ultrasound performed. Patient was given IV fluids and discharged with Reglan, Compazine. Of note, patient states that she has been vomiting during the whole pregnancy. She states that she has diffuse lower abdominal cramps and back pain. Denies dysuria, states that urine appears darker than usual. States that diarrhea has stopped    The history is provided by the patient.    Past Medical History:  Diagnosis Date  . Anemia   . Asthma   . Hypokalemia     Patient Active Problem List   Diagnosis Date Noted  . Asthma exacerbation 02/27/2016  . Anemia 02/27/2016  . Hypokalemia 02/27/2016  . Asthma 02/27/2016    Past Surgical History:  Procedure Laterality Date  . WISDOM TOOTH EXTRACTION      OB History    Gravida Para Term Preterm AB Living   3 2 2  0 0 2   SAB TAB Ectopic Multiple Live Births   0 0 0 0 2       Home Medications    Prior to Admission medications   Medication Sig Start Date End Date Taking? Authorizing Provider  albuterol (PROVENTIL) (2.5 MG/3ML) 0.083% nebulizer solution Take 3 mLs (2.5 mg total) by nebulization every 6 (six) hours as needed for wheezing or shortness of breath. Patient not taking: Reported on 12/12/2016 02/29/16   Marlin Canary U, DO  ipratropium (ATROVENT) 0.02 % nebulizer solution Take 2.5 mLs (0.5 mg total) by nebulization 4 (four) times daily. Patient not taking: Reported on 12/12/2016 02/29/16   Joseph Art, DO  predniSONE (DELTASONE) 10 MG tablet 60 mg x 2 days, 50 mg x  2 days, 40 mg x 2 days, 30 mg x 2 days, 33m g x 2 days, 10 mg x 2 days then stop Patient not taking: Reported on 12/12/2016 02/29/16   Joseph Art, DO    Family History Family History  Problem Relation Age of Onset  . Cancer Mother   . Asthma Mother     Social History Social History  Substance Use Topics  . Smoking status: Never Smoker  . Smokeless tobacco: Never Used  . Alcohol use No     Allergies   Penicillins   Review of Systems Review of Systems  Gastrointestinal: Positive for abdominal pain and vomiting.  All other systems reviewed and are negative.    Physical Exam Updated Vital Signs BP (!) 124/102 (BP Location: Right Arm)   Pulse 75   Temp 98.6 F (37 C) (Oral)   Resp 19   Ht 5\' 3"  (1.6 m)   Wt 86.2 kg (190 lb)   LMP 10/28/2016 (Exact Date)   SpO2 100%   BMI 33.66 kg/m   Physical Exam  Constitutional: She is oriented to person, place, and time.  Uncomfortable   HENT:  Head: Normocephalic.  MM slightly dry   Eyes: EOM are normal. Pupils are equal, round, and reactive to light.  Neck: Normal range of motion. Neck supple.  Cardiovascular: Normal rate,  regular rhythm and normal heart sounds.   Pulmonary/Chest: Effort normal and breath sounds normal. No respiratory distress. She has no wheezes. She has no rales.  Abdominal: Soft. Bowel sounds are normal.  Gravid uterus, nontender, no obvious CVAT   Musculoskeletal: Normal range of motion.  Neurological: She is alert and oriented to person, place, and time. No cranial nerve deficit. Coordination normal.  Skin: Skin is warm.  Psychiatric: She has a normal mood and affect.  Nursing note and vitals reviewed.    ED Treatments / Results  Labs (all labs ordered are listed, but only abnormal results are displayed) Labs Reviewed  COMPREHENSIVE METABOLIC PANEL - Abnormal; Notable for the following:       Result Value   Potassium 2.6 (*)    BUN <5 (*)    Calcium 8.8 (*)    Total Protein 6.1 (*)     Albumin 2.9 (*)    AST 14 (*)    ALT 8 (*)    All other components within normal limits  CBC - Abnormal; Notable for the following:    RBC 3.50 (*)    Hemoglobin 10.3 (*)    HCT 30.6 (*)    All other components within normal limits  URINALYSIS, ROUTINE W REFLEX MICROSCOPIC - Abnormal; Notable for the following:    Color, Urine AMBER (*)    APPearance CLOUDY (*)    Hgb urine dipstick MODERATE (*)    Ketones, ur 80 (*)    Protein, ur 30 (*)    Leukocytes, UA MODERATE (*)    Bacteria, UA MANY (*)    Squamous Epithelial / LPF TOO NUMEROUS TO COUNT (*)    All other components within normal limits  I-STAT BETA HCG BLOOD, ED ([facility], [facility], [facility] ONLY) - Abnormal; Notable for the following:    I-stat hCG, quantitative >2,000.0 (*)    All other components within normal limits  LIPASE, BLOOD  HCG, QUANTITATIVE, PREGNANCY    EKG  EKG Interpretation None       Radiology No results found.  Procedures Procedures (including critical care time)  EMERGENCY DEPARTMENT US PREGNANCY "Study: Limited Ultrasound of the Pelvis for Pregnancy"  INDICATIONS:Pregnancy(required) Multiple views of the uterus and pelvic cavity were obtained in real-time with a multi-frequency probe.  APPROACH:Transabdominal  PERFORMED BY: Myself IMAGES ARCHIVED?: Yes LIMITATIONS: Body habitus PREGNANCY FREE FLUID: None ADNEXAL FINDINGS:Left ovary not seen and Right ovary not seen GESTATIONAL AGE, ESTIMATE:  FETAL HEART RATE: 167 INTERPRETATION: Intrauterine gestational sac noted, Yolk sac noted and Fetal pole present   EMERGENCY DEPARTMENT US RENAL EXAM  "Study: Limited Retroperitoneal Ultrasound of Kidneys"  INDICATIONS: Hematuria Long and short axis of both kidneys were obtained.   PERFORMED BY: Myself IMAGES ARCHIVED?: Yes LIMITATIONS: Body habitus VIEWS USED: Long axis and Short axis  INTERPRETATION: No Hydronephrosis       Medications Ordered in ED Medications  potassium chloride SA  (K-DUR,KLOR-CON) CR tablet 40 mEq (not administered)  lactated ringers bolus 1,000 mL (not administered)  potassium chloride 10 mEq in 100 mL IVPB (not administered)  cephALEXin (KEFLEX) capsule 500 mg (not administered)  sodium chloride 0.9 % bolus 1,000 mL (1,000 mLs Intravenous New Bag/Given 03/17/17 1605)  prochlorperazine (COMPAZINE) injection 10 mg (10 mg Intravenous Given 03/17/17 1609)  metoCLOPramide (REGLAN) injection 10 mg (10 mg Intravenous Given 03/17/17 1607)  diphenhydrAMINE (BENADRYL) injection 12.5 mg (12.5 mg Intravenous Given 03/17/17 1606)     Initial Impression / Assessment and Plan / ED Course  I  have reviewed the triage vital signs and the nursing notes.  Pertinent labs & imaging results that were available during my care of the patient were reviewed by me and considered in my medical decision making (see chart for details).    Mercedes [name] is a 26 y.o. female here with abdominal pain, vomiting. Has known IUP on US done at [facility] med. She has gastro symptoms for several days and was diagnosed with gastroenteritis and hyperemesis (even though patient states that she had kidney stone but no documentation supported that). Will check labs, UA, Will give IV reglan, compazine, IVF.   4:33 PM OB nurse saw her. I performed bedside US. Confirmed IUP. No obvious hydro on renal US. She does have hematuria but also has UTI. Her K 2.6, I ordered potassium supplement. Also ordered abx. She states that she felt better before the IV potassium run and wants to sign out AMA. I recommend that she stays for K run and IV rocephin but refused. Request refill for her compazine, reglan. Will also add keflex.    Final Clinical Impressions(s) / ED Diagnoses   Final diagnoses:  None    New Prescriptions New Prescriptions   No medications on file     Charlynne Pander, MD 03/17/17 [phone]

## 2017-03-17 NOTE — ED Notes (Signed)
Pt ambulated to RR .  

## 2017-03-17 NOTE — Discharge Instructions (Signed)
Take reglan, compazine as prescribed for nausea.   Take potassium as prescribed. You potassium is low. That is why you are having abdominal cramps   Take keflex three times daily for a week for UTI.   See your OB doctor   Return to ER if you have severe abdominal pain, vomiting, fever, trouble urinating.

## 2017-03-17 NOTE — ED Notes (Signed)
Mercedes LayYao, MD at bedside performing US, baby visualized with HR 160, US completed on kidneys by MD as well

## 2017-03-17 NOTE — ED Notes (Signed)
[facility] OB called and states when patient is given a room the OB RN will come doppler the patient.

## 2017-03-17 NOTE — ED Notes (Signed)
Silverio LayYao, MD at bedside, [facility] aware & verbalizes she will be seeing the pt for monitoring per Charge

## 2017-03-17 NOTE — Progress Notes (Signed)
Here to evaluate this 26 yo G3P2 @ [redacted] wks GA in with report of abdominal pain.  FHR WNL.

## 2017-05-04 ENCOUNTER — Encounter (HOSPITAL_COMMUNITY): Payer: Self-pay | Admitting: *Deleted

## 2017-05-04 ENCOUNTER — Inpatient Hospital Stay (HOSPITAL_COMMUNITY)
Admission: AD | Admit: 2017-05-04 | Discharge: 2017-05-04 | Disposition: A | Discharge status: Home / Self Care | Payer: Medicaid Other | Source: Ambulatory Visit | Attending: Obstetrics & Gynecology | Admitting: Obstetrics & Gynecology

## 2017-05-04 DIAGNOSIS — Z9889 Other specified postprocedural states: Secondary | ICD-10-CM | POA: Diagnosis not present

## 2017-05-04 DIAGNOSIS — Z880 Allergy status to penicillin: Secondary | ICD-10-CM | POA: Diagnosis not present

## 2017-05-04 DIAGNOSIS — Z825 Family history of asthma and other chronic lower respiratory diseases: Secondary | ICD-10-CM | POA: Insufficient documentation

## 2017-05-04 DIAGNOSIS — O26899 Other specified pregnancy related conditions, unspecified trimester: Secondary | ICD-10-CM | POA: Diagnosis not present

## 2017-05-04 DIAGNOSIS — Z91011 Allergy to milk products: Secondary | ICD-10-CM | POA: Diagnosis not present

## 2017-05-04 DIAGNOSIS — O99512 Diseases of the respiratory system complicating pregnancy, second trimester: Secondary | ICD-10-CM | POA: Diagnosis not present

## 2017-05-04 DIAGNOSIS — O210 Mild hyperemesis gravidarum: Secondary | ICD-10-CM | POA: Diagnosis not present

## 2017-05-04 DIAGNOSIS — O211 Hyperemesis gravidarum with metabolic disturbance: Secondary | ICD-10-CM | POA: Diagnosis not present

## 2017-05-04 DIAGNOSIS — J45909 Unspecified asthma, uncomplicated: Secondary | ICD-10-CM | POA: Insufficient documentation

## 2017-05-04 DIAGNOSIS — E860 Dehydration: Secondary | ICD-10-CM | POA: Diagnosis not present

## 2017-05-04 DIAGNOSIS — O99282 Endocrine, nutritional and metabolic diseases complicating pregnancy, second trimester: Secondary | ICD-10-CM | POA: Insufficient documentation

## 2017-05-04 DIAGNOSIS — Z3A26 26 weeks gestation of pregnancy: Secondary | ICD-10-CM | POA: Insufficient documentation

## 2017-05-04 DIAGNOSIS — Z809 Family history of malignant neoplasm, unspecified: Secondary | ICD-10-CM | POA: Diagnosis not present

## 2017-05-04 DIAGNOSIS — O26892 Other specified pregnancy related conditions, second trimester: Secondary | ICD-10-CM | POA: Diagnosis not present

## 2017-05-04 DIAGNOSIS — R109 Unspecified abdominal pain: Secondary | ICD-10-CM | POA: Diagnosis not present

## 2017-05-04 LAB — WET PREP, GENITAL
Clue Cells Wet Prep HPF POC: NONE SEEN
Sperm: NONE SEEN
TRICH WET PREP: NONE SEEN
YEAST WET PREP: NONE SEEN

## 2017-05-04 LAB — CBC WITH DIFFERENTIAL/PLATELET
Basophils Absolute: 0 10*3/uL (ref 0.0–0.1)
Basophils Relative: 0 %
EOS PCT: 0 %
Eosinophils Absolute: 0 10*3/uL (ref 0.0–0.7)
HCT: 30.7 % — ABNORMAL LOW (ref 36.0–46.0)
Hemoglobin: 10.5 g/dL — ABNORMAL LOW (ref 12.0–15.0)
LYMPHS ABS: 2.4 10*3/uL (ref 0.7–4.0)
LYMPHS PCT: 23 %
MCH: 30.3 pg (ref 26.0–34.0)
MCHC: 34.2 g/dL (ref 30.0–36.0)
MCV: 88.5 fL (ref 78.0–100.0)
Monocytes Absolute: 0.3 10*3/uL (ref 0.1–1.0)
Monocytes Relative: 3 %
Neutro Abs: 7.6 10*3/uL (ref 1.7–7.7)
Neutrophils Relative %: 74 %
PLATELETS: 170 10*3/uL (ref 150–400)
RBC: 3.47 MIL/uL — AB (ref 3.87–5.11)
RDW: 13.5 % (ref 11.5–15.5)
WBC: 10.3 10*3/uL (ref 4.0–10.5)

## 2017-05-04 LAB — COMPREHENSIVE METABOLIC PANEL
ALK PHOS: 85 U/L (ref 38–126)
ALT: 8 U/L — AB (ref 14–54)
AST: 12 U/L — ABNORMAL LOW (ref 15–41)
Albumin: 3.1 g/dL — ABNORMAL LOW (ref 3.5–5.0)
Anion gap: 9 (ref 5–15)
BILIRUBIN TOTAL: 0.4 mg/dL (ref 0.3–1.2)
BUN: 6 mg/dL (ref 6–20)
CHLORIDE: 102 mmol/L (ref 101–111)
CO2: 23 mmol/L (ref 22–32)
CREATININE: 0.52 mg/dL (ref 0.44–1.00)
Calcium: 9.1 mg/dL (ref 8.9–10.3)
Glucose, Bld: 71 mg/dL (ref 65–99)
Potassium: 3.5 mmol/L (ref 3.5–5.1)
Sodium: 134 mmol/L — ABNORMAL LOW (ref 135–145)
TOTAL PROTEIN: 6.5 g/dL (ref 6.5–8.1)

## 2017-05-04 LAB — URINALYSIS, ROUTINE W REFLEX MICROSCOPIC
BILIRUBIN URINE: NEGATIVE
Bacteria, UA: NONE SEEN
GLUCOSE, UA: NEGATIVE mg/dL
Hgb urine dipstick: NEGATIVE
KETONES UR: 80 mg/dL — AB
LEUKOCYTES UA: NEGATIVE
Nitrite: NEGATIVE
PH: 6 (ref 5.0–8.0)
PROTEIN: 30 mg/dL — AB
Specific Gravity, Urine: 1.024 (ref 1.005–1.030)

## 2017-05-04 MED ORDER — METOCLOPRAMIDE HCL 5 MG/ML IJ SOLN
10.0000 mg | Freq: Once | INTRAMUSCULAR | Status: AC
  Administered 2017-05-04: 10 mg via INTRAVENOUS
  Filled 2017-05-04: qty 2

## 2017-05-04 MED ORDER — LACTATED RINGERS IV BOLUS (SEPSIS)
1000.0000 mL | Freq: Once | INTRAVENOUS | Status: AC
  Administered 2017-05-04: 1000 mL via INTRAVENOUS

## 2017-05-04 MED ORDER — METOCLOPRAMIDE HCL 10 MG PO TABS: 10.0000 mg | ORAL_TABLET | Freq: Four times a day (QID) | ORAL | 0 refills | Status: DC | PRN

## 2017-05-04 NOTE — MAU Provider Note (Signed)
History     CSN: [id number]  Arrival date and time: 05/04/17 4098   First Provider Initiated Contact with Patient 05/04/17 1024      Chief Complaint  Patient presents with  . Abdominal Pain  . Back Pain  . Contractions  . Emesis   [id number] @26 .6 here with abdominal pain. Pain started around 0200. Describes as ctx and crampy, intermittent, lasting about 1 min, unsure of frequency. Denies VB, LOF, vaginal discharge. No recent IC. No urinary sx. She was receiving care at [facility] but recently moved here. Last visit was in May, minimal records in care everywhere. Hx of HEG, she vomits about 10 times a day and was using Reglan and Compazine but ran out of Reglan. Hx of hypokalemia.   OB History    Gravida Para Term Preterm AB Living   3 2 2  0 0 2   SAB TAB Ectopic Multiple Live Births   0 0 0 0 2      Past Medical History:  Diagnosis Date  . Anemia   . Asthma   . Hypokalemia     Past Surgical History:  Procedure Laterality Date  . WISDOM TOOTH EXTRACTION      Family History  Problem Relation Age of Onset  . Cancer Mother   . Asthma Mother     Social History  Substance Use Topics  . Smoking status: Never Smoker  . Smokeless tobacco: Never Used  . Alcohol use No    Allergies:  Allergies  Allergen Reactions  . Milk-Related Compounds   . Penicillins     Childhood allergy    Prescriptions Prior to Admission  Medication Sig Dispense Refill Last Dose  . albuterol (PROVENTIL) (2.5 MG/3ML) 0.083% nebulizer solution Take 3 mLs (2.5 mg total) by nebulization every 6 (six) hours as needed for wheezing or shortness of breath. 75 mL 0 05/03/2017 at Unknown time  . cephALEXin (KEFLEX) 500 MG capsule Take 1 capsule (500 mg total) by mouth 3 (three) times daily. 21 capsule 0 Past Month at Unknown time  . ipratropium (ATROVENT) 0.02 % nebulizer solution Take 2.5 mLs (0.5 mg total) by nebulization 4 (four) times daily. 75 mL 12 05/03/2017 at Unknown time  . metoCLOPramide  (REGLAN) 10 MG tablet Take 1 tablet (10 mg total) by mouth every 6 (six) hours as needed for nausea (nausea/headache). 10 tablet 0 Past Month at Unknown time  . prochlorperazine (COMPAZINE) 10 MG tablet Take 1 tablet (10 mg total) by mouth every 8 (eight) hours as needed for nausea or vomiting. 15 tablet 0 05/03/2017 at Unknown time  . potassium chloride SA (K-DUR,KLOR-CON) 20 MEQ tablet Take 1 tablet (20 mEq total) by mouth daily. 5 tablet 0 More than a month at Unknown time  . predniSONE (DELTASONE) 10 MG tablet 60 mg x 2 days, 50 mg x 2 days, 40 mg x 2 days, 30 mg x 2 days, 71m g x 2 days, 10 mg x 2 days then stop (Patient not taking: Reported on 12/12/2016) 42 tablet 0 Unknown at Unknown time    Review of Systems  Constitutional: Negative for chills and fever.  Gastrointestinal: Positive for abdominal pain, nausea and vomiting.  Genitourinary: Negative for dysuria, frequency, urgency, vaginal bleeding and vaginal discharge.  Musculoskeletal: Positive for back pain.   Physical Exam   Blood pressure (!) 108/56, pulse 80, temperature (!) 100.6 F (38.1 C), resp. rate 20, height 5\' 4"  (1.626 m), weight 86.2 kg (190 lb), last menstrual period 10/28/2016.  Physical Exam  Constitutional: She is oriented to person, place, and time. She appears well-developed and well-nourished. No distress.  HENT:  Head: Normocephalic and atraumatic.  Neck: Normal range of motion.  Cardiovascular: Normal rate.   Respiratory: Effort normal. No respiratory distress.  GI: Soft. She exhibits no distension and no mass. There is tenderness (fundal). There is no rebound and no guarding.  gravid  Genitourinary:  Genitourinary Comments: External: no lesions or erythema Vagina: rugated, pink, moist, thin white discharge Cervix  Closed/thick   Musculoskeletal: Normal range of motion.  Neurological: She is alert and oriented to person, place, and time.  Skin: Skin is warm and dry.  Psychiatric: She has a normal mood  and affect.  EFM: 140 bpm, mod variability, no accels, no decels Toco: irritability  Results for orders placed or performed during the hospital encounter of 05/04/17 (from the past 24 hour(s))  Wet prep, genital     Status: Abnormal   Collection Time: 05/04/17 10:30 AM  Result Value Ref Range   Yeast Wet Prep HPF POC NONE SEEN NONE SEEN   Trich, Wet Prep NONE SEEN NONE SEEN   Clue Cells Wet Prep HPF POC NONE SEEN NONE SEEN   WBC, Wet Prep HPF POC FEW (A) NONE SEEN   Sperm NONE SEEN   Urinalysis, Routine w reflex microscopic     Status: Abnormal   Collection Time: 05/04/17 10:45 AM  Result Value Ref Range   Color, Urine YELLOW YELLOW   APPearance HAZY (A) CLEAR   Specific Gravity, Urine 1.024 1.005 - 1.030   pH 6.0 5.0 - 8.0   Glucose, UA NEGATIVE NEGATIVE mg/dL   Hgb urine dipstick NEGATIVE NEGATIVE   Bilirubin Urine NEGATIVE NEGATIVE   Ketones, ur 80 (A) NEGATIVE mg/dL   Protein, ur 30 (A) NEGATIVE mg/dL   Nitrite NEGATIVE NEGATIVE   Leukocytes, UA NEGATIVE NEGATIVE   RBC / HPF 0-5 0 - 5 RBC/hpf   WBC, UA 0-5 0 - 5 WBC/hpf   Bacteria, UA NONE SEEN NONE SEEN   Squamous Epithelial / LPF 6-30 (A) NONE SEEN   Mucous PRESENT   CBC with Differential/Platelet     Status: Abnormal   Collection Time: 05/04/17 11:06 AM  Result Value Ref Range   WBC 10.3 4.0 - 10.5 K/uL   RBC 3.47 (L) 3.87 - 5.11 MIL/uL   Hemoglobin 10.5 (L) 12.0 - 15.0 g/dL   HCT 16.1 (L) 09.6 - 04.5 %   MCV 88.5 78.0 - 100.0 fL   MCH 30.3 26.0 - 34.0 pg   MCHC 34.2 30.0 - 36.0 g/dL   RDW 40.9 81.1 - 91.4 %   Platelets 170 150 - 400 K/uL   Neutrophils Relative % 74 %   Neutro Abs 7.6 1.7 - 7.7 K/uL   Lymphocytes Relative 23 %   Lymphs Abs 2.4 0.7 - 4.0 K/uL   Monocytes Relative 3 %   Monocytes Absolute 0.3 0.1 - 1.0 K/uL   Eosinophils Relative 0 %   Eosinophils Absolute 0.0 0.0 - 0.7 K/uL   Basophils Relative 0 %   Basophils Absolute 0.0 0.0 - 0.1 K/uL  Comprehensive metabolic panel     Status: Abnormal    Collection Time: 05/04/17 11:06 AM  Result Value Ref Range   Sodium 134 (L) 135 - 145 mmol/L   Potassium 3.5 3.5 - 5.1 mmol/L   Chloride 102 101 - 111 mmol/L   CO2 23 22 - 32 mmol/L   Glucose, Bld 71 65 -  99 mg/dL   BUN 6 6 - 20 mg/dL   Creatinine, Ser 1.610.52 0.44 - 1.00 mg/dL   Calcium 9.1 8.9 - 09.610.3 mg/dL   Total Protein 6.5 6.5 - 8.1 g/dL   Albumin 3.1 (L) 3.5 - 5.0 g/dL   AST 12 (L) 15 - 41 U/L   ALT 8 (L) 14 - 54 U/L   Alkaline Phosphatase 85 38 - 126 U/L   Total Bilirubin 0.4 0.3 - 1.2 mg/dL   GFR calc non Af Amer >60 >60 mL/min   GFR calc Af Amer >60 >60 mL/min   Anion gap 9 5 - 15   MAU Course  Procedures LR 1 L bolus Reglan  MDM Labs ordered and reviewed. No evidence of UTI, pelvic infection, or PTL. Now afebrile. Pt feeling better after hydration and antiemetic. Dehydration could cause uterine irritability. Stable for discharge home.  Assessment and Plan   1. [redacted] weeks gestation of pregnancy   2. Abdominal pain affecting pregnancy   3. Hyperemesis gravidarum   4. Dehydration    Discharge home Follow up in [facility] to establish care-clinic to call with appt-message sent to admin pool Return precautions Rx Reglan  Allergies as of 05/04/2017      Reactions   Milk-related Compounds    Penicillins    Has patient had a PCN reaction causing immediate rash, facial/tongue/throat swelling, SOB or lightheadedness with hypotension: Unknown Has patient had a PCN reaction causing severe rash involving mucus membranes or skin necrosis: Unknown Has patient had a PCN reaction that required hospitalization: Unknown Has patient had a PCN reaction occurring within the last 10 years: Unknown If all of the above answers are "NO", then may proceed with Cephalosporin use.      Medication List    STOP taking these medications   cephALEXin 500 MG capsule Commonly known as:  KEFLEX   potassium chloride SA 20 MEQ tablet Commonly known as:  K-DUR,KLOR-CON   predniSONE 10 MG  tablet Commonly known as:  DELTASONE     TAKE these medications   albuterol (2.5 MG/3ML) 0.083% nebulizer solution Commonly known as:  PROVENTIL Take 3 mLs (2.5 mg total) by nebulization every 6 (six) hours as needed for wheezing or shortness of breath.   ipratropium 0.02 % nebulizer solution Commonly known as:  ATROVENT Take 2.5 mLs (0.5 mg total) by nebulization 4 (four) times daily.   metoCLOPramide 10 MG tablet Commonly known as:  REGLAN Take 1 tablet (10 mg total) by mouth every 6 (six) hours as needed for nausea (nausea/headache).   prochlorperazine 10 MG tablet Commonly known as:  COMPAZINE Take 1 tablet (10 mg total) by mouth every 8 (eight) hours as needed for nausea or vomiting.      Donette LarryMelanie Telesia Ates, CNM 05/04/2017, 10:40 AM

## 2017-05-04 NOTE — Discharge Instructions (Signed)

## 2017-05-04 NOTE — MAU Note (Signed)
Pt via ems complaining of contractions

## 2017-05-05 LAB — GC/CHLAMYDIA PROBE AMP (~~LOC~~) NOT AT ARMC
Chlamydia: NEGATIVE
NEISSERIA GONORRHEA: NEGATIVE

## 2017-05-10 ENCOUNTER — Encounter: Payer: Medicaid Other | Admitting: Certified Nurse Midwife

## 2017-05-10 ENCOUNTER — Encounter: Payer: Self-pay | Admitting: Family Medicine

## 2017-05-13 ENCOUNTER — Inpatient Hospital Stay (HOSPITAL_COMMUNITY): Payer: Medicaid Other

## 2017-05-13 ENCOUNTER — Encounter (HOSPITAL_COMMUNITY): Payer: Self-pay | Admitting: Anesthesiology

## 2017-05-13 ENCOUNTER — Inpatient Hospital Stay (HOSPITAL_COMMUNITY)
Admission: AD | Admit: 2017-05-13 | Discharge: 2017-05-13 | Disposition: A | Discharge status: Home / Self Care | Payer: Medicaid Other | Source: Ambulatory Visit | Attending: Obstetrics and Gynecology | Admitting: Obstetrics and Gynecology

## 2017-05-13 ENCOUNTER — Encounter (HOSPITAL_COMMUNITY): Payer: Self-pay | Admitting: *Deleted

## 2017-05-13 DIAGNOSIS — R109 Unspecified abdominal pain: Secondary | ICD-10-CM | POA: Diagnosis not present

## 2017-05-13 DIAGNOSIS — Z3A28 28 weeks gestation of pregnancy: Secondary | ICD-10-CM | POA: Diagnosis not present

## 2017-05-13 DIAGNOSIS — O26893 Other specified pregnancy related conditions, third trimester: Secondary | ICD-10-CM | POA: Diagnosis not present

## 2017-05-13 DIAGNOSIS — O210 Mild hyperemesis gravidarum: Secondary | ICD-10-CM

## 2017-05-13 DIAGNOSIS — Z880 Allergy status to penicillin: Secondary | ICD-10-CM | POA: Diagnosis not present

## 2017-05-13 DIAGNOSIS — E876 Hypokalemia: Secondary | ICD-10-CM

## 2017-05-13 LAB — CBC WITH DIFFERENTIAL/PLATELET
Basophils Absolute: 0 10*3/uL (ref 0.0–0.1)
Basophils Relative: 0 %
Eosinophils Absolute: 0 10*3/uL (ref 0.0–0.7)
Eosinophils Relative: 0 %
HEMATOCRIT: 32 % — AB (ref 36.0–46.0)
HEMOGLOBIN: 10.9 g/dL — AB (ref 12.0–15.0)
LYMPHS PCT: 21 %
Lymphs Abs: 1.9 10*3/uL (ref 0.7–4.0)
MCH: 29.8 pg (ref 26.0–34.0)
MCHC: 34.1 g/dL (ref 30.0–36.0)
MCV: 87.4 fL (ref 78.0–100.0)
Monocytes Absolute: 0.2 10*3/uL (ref 0.1–1.0)
Monocytes Relative: 2 %
NEUTROS ABS: 7.2 10*3/uL (ref 1.7–7.7)
NEUTROS PCT: 77 %
Platelets: 175 10*3/uL (ref 150–400)
RBC: 3.66 MIL/uL — AB (ref 3.87–5.11)
RDW: 13.3 % (ref 11.5–15.5)
WBC: 9.3 10*3/uL (ref 4.0–10.5)

## 2017-05-13 LAB — URINALYSIS, ROUTINE W REFLEX MICROSCOPIC
Bilirubin Urine: NEGATIVE
Glucose, UA: NEGATIVE mg/dL
Ketones, ur: 80 mg/dL — AB
NITRITE: NEGATIVE
Protein, ur: 100 mg/dL — AB
SPECIFIC GRAVITY, URINE: 1.027 (ref 1.005–1.030)
pH: 5 (ref 5.0–8.0)

## 2017-05-13 LAB — COMPREHENSIVE METABOLIC PANEL
ALK PHOS: 90 U/L (ref 38–126)
ALT: 10 U/L — AB (ref 14–54)
AST: 13 U/L — ABNORMAL LOW (ref 15–41)
Albumin: 3.3 g/dL — ABNORMAL LOW (ref 3.5–5.0)
Anion gap: 9 (ref 5–15)
BILIRUBIN TOTAL: 0.6 mg/dL (ref 0.3–1.2)
BUN: 7 mg/dL (ref 6–20)
CALCIUM: 9 mg/dL (ref 8.9–10.3)
CO2: 23 mmol/L (ref 22–32)
CREATININE: 0.55 mg/dL (ref 0.44–1.00)
Chloride: 102 mmol/L (ref 101–111)
Glucose, Bld: 74 mg/dL (ref 65–99)
Potassium: 2.9 mmol/L — ABNORMAL LOW (ref 3.5–5.1)
SODIUM: 134 mmol/L — AB (ref 135–145)
Total Protein: 7.3 g/dL (ref 6.5–8.1)

## 2017-05-13 LAB — RAPID URINE DRUG SCREEN, HOSP PERFORMED
Amphetamines: NOT DETECTED
Barbiturates: NOT DETECTED
Benzodiazepines: NOT DETECTED
Cocaine: NOT DETECTED
OPIATES: NOT DETECTED
Tetrahydrocannabinol: POSITIVE — AB

## 2017-05-13 LAB — ABO/RH: ABO/RH(D): A POS

## 2017-05-13 MED ORDER — DEXTROSE 5 % IN LACTATED RINGERS IV BOLUS
1000.0000 mL | Freq: Once | INTRAVENOUS | Status: AC
  Administered 2017-05-13: 1000 mL via INTRAVENOUS

## 2017-05-13 MED ORDER — POTASSIUM CHLORIDE CRYS ER 20 MEQ PO TBCR
40.0000 meq | EXTENDED_RELEASE_TABLET | Freq: Once | ORAL | Status: AC
  Administered 2017-05-13: 40 meq via ORAL
  Filled 2017-05-13: qty 2

## 2017-05-13 MED ORDER — ONDANSETRON 4 MG PO TBDP: 4.0000 mg | ORAL_TABLET | Freq: Three times a day (TID) | ORAL | 1 refills | Status: DC | PRN

## 2017-05-13 MED ORDER — OXYCODONE-ACETAMINOPHEN 5-325 MG PO TABS
2.0000 | ORAL_TABLET | Freq: Once | ORAL | Status: AC
  Administered 2017-05-13: 2 via ORAL
  Filled 2017-05-13: qty 2

## 2017-05-13 MED ORDER — PROMETHAZINE HCL 25 MG/ML IJ SOLN
12.5000 mg | Freq: Once | INTRAMUSCULAR | Status: AC
  Administered 2017-05-13: 12.5 mg via INTRAVENOUS
  Filled 2017-05-13: qty 1

## 2017-05-13 MED ORDER — LACTATED RINGERS IV BOLUS (SEPSIS)
1000.0000 mL | Freq: Once | INTRAVENOUS | Status: AC
  Administered 2017-05-13: 1000 mL via INTRAVENOUS

## 2017-05-13 MED ORDER — NALBUPHINE SYRINGE 5 MG/0.5 ML
10.0000 mg | INJECTION | Freq: Once | INTRAMUSCULAR | Status: AC
Start: 2017-05-13 — End: 2017-05-13
  Administered 2017-05-13: 10 mg via INTRAVENOUS
  Filled 2017-05-13: qty 1

## 2017-05-13 MED ORDER — ONDANSETRON 8 MG PO TBDP
8.0000 mg | ORAL_TABLET | Freq: Once | ORAL | Status: AC
  Administered 2017-05-13: 8 mg via ORAL
  Filled 2017-05-13: qty 1

## 2017-05-13 NOTE — MAU Provider Note (Signed)
History   [id number] @ 28.1 wks in via EMS with severe abd pain that started several hours ago. Also states throws up 8-10 times a day.   CSN: [id number]  Arrival date & time 05/13/17  1507   First Provider Initiated Contact with Patient 05/13/17 1510      Chief Complaint  Patient presents with  . Contractions  . Abdominal Pain    HPI  Past Medical History:  Diagnosis Date  . Anemia   . Asthma   . Hypokalemia     Past Surgical History:  Procedure Laterality Date  . WISDOM TOOTH EXTRACTION      Family History  Problem Relation Age of Onset  . Cancer Mother   . Asthma Mother     Social History  Substance Use Topics  . Smoking status: Never Smoker  . Smokeless tobacco: Never Used  . Alcohol use No    OB History    Gravida Para Term Preterm AB Living   4 2 2  0 1 2   SAB TAB Ectopic Multiple Live Births   0 0 0 0 2      Review of Systems  Constitutional: Negative.   HENT: Negative.   Eyes: Negative.   Respiratory: Negative.   Cardiovascular: Negative.   Gastrointestinal: Positive for abdominal pain, nausea and vomiting.  Endocrine: Negative.   Genitourinary: Negative.   Musculoskeletal: Negative.   Skin: Negative.   Allergic/Immunologic: Negative.   Neurological: Negative.   Hematological: Negative.   Psychiatric/Behavioral: Negative.     Allergies  Milk-related compounds and Penicillins  Home Medications    BP (!) 101/57   Pulse 86   Resp 18   LMP 10/28/2016 (Exact Date)   Physical Exam  Constitutional: She is oriented to person, place, and time. She appears well-developed and well-nourished.  HENT:  Head: Normocephalic.  Eyes: Pupils are equal, round, and reactive to light.  Neck: Normal range of motion.  Cardiovascular: Normal rate, regular rhythm, normal heart sounds and intact distal pulses.   Pulmonary/Chest: Effort normal and breath sounds normal.  Abdominal: Soft. There is tenderness.  Genitourinary: Vagina normal and uterus  normal.  Musculoskeletal: Normal range of motion.  Neurological: She is alert and oriented to person, place, and time. She has normal reflexes.  Skin: Skin is warm and dry.  Psychiatric: She has a normal mood and affect. Her behavior is normal. Judgment and thought content normal.    MAU Course  Procedures (including critical care time)  Labs Reviewed  RAPID URINE DRUG SCREEN, HOSP PERFORMED - Abnormal; Notable for the following:       Result Value   Tetrahydrocannabinol POSITIVE (*)    All other components within normal limits  URINALYSIS, ROUTINE W REFLEX MICROSCOPIC - Abnormal; Notable for the following:    Color, Urine AMBER (*)    APPearance HAZY (*)    Hgb urine dipstick MODERATE (*)    Ketones, ur 80 (*)    Protein, ur 100 (*)    Leukocytes, UA TRACE (*)    Bacteria, UA RARE (*)    Squamous Epithelial / LPF 6-30 (*)    All other components within normal limits  CBC WITH DIFFERENTIAL/PLATELET  COMPREHENSIVE METABOLIC PANEL  ABO/RH   No results found.   1. Abdominal pain       MDM  SVE cl/th/post/high. abd tenderness with palpation. U/a 80 ketones, k 2.5. Will IV hydrate and give 40 k po and consult Dr. Gomez Cleverly regarding POC if normal EKG pt  to be discharged home per Dr. Gomez CleverlyPickins. 17:54 States feeling much better. EKG normal. 19:25 pt states feeling better tolerating po's, requesting d/c home. Will message clinic to get pt in for care ASAP.

## 2017-05-13 NOTE — Discharge Instructions (Signed)
Abdominal Pain During Pregnancy °Abdominal pain is common in pregnancy. Most of the time, it does not cause harm. There are many causes of abdominal pain. Some causes are more serious than others and sometimes the cause is not known. Abdominal pain can be a sign that something is very wrong with the pregnancy or the pain may have nothing to do with the pregnancy. Always tell your health care provider if you have any abdominal pain. °Follow these instructions at home: °· Do not have sex or put anything in your vagina until your symptoms go away completely. °· Watch your abdominal pain for any changes. °· Get plenty of rest until your pain improves. °· Drink enough fluid to keep your urine clear or pale yellow. °· Take over-the-counter or prescription medicines only as told by your health care provider. °· Keep all follow-up visits as told by your health care provider. This is important. °Contact a health care provider if: °· You have a fever. °· Your pain gets worse or you have cramping. °· Your pain continues after resting. °Get help right away if: °· You are bleeding, leaking fluid, or passing tissue from the vagina. °· You have vomiting or diarrhea that does not go away. °· You have painful or bloody urination. °· You notice a decrease in your baby's movements. °· You feel very weak or faint. °· You have shortness of breath. °· You develop a severe headache with abdominal pain. °· You have abnormal vaginal discharge with abdominal pain. °This information is not intended to replace advice given to you by your health care provider. Make sure you discuss any questions you have with your health care provider. °Document Released: 10/10/2005 Document Revised: 07/21/2016 Document Reviewed: 05/09/2013 °Elsevier Interactive Patient Education © 2018 [employer] ° °Morning Sickness °Morning sickness is when you feel sick to your stomach (nauseous) during pregnancy. This nauseous feeling may or may not come with vomiting.  It often occurs in the morning but can be a problem any time of day. Morning sickness is most common during the first trimester, but it may continue throughout pregnancy. While morning sickness is unpleasant, it is usually harmless unless you develop severe and continual vomiting (hyperemesis gravidarum). This condition requires more intense treatment. °What are the causes? °The cause of morning sickness is not completely known but seems to be related to normal hormonal changes that occur in pregnancy. °What increases the risk? °You are at greater risk if you: °· Experienced nausea or vomiting before your pregnancy. °· Had morning sickness during a previous pregnancy. °· Are pregnant with more than one baby, such as twins. ° °How is this treated? °Do not use any medicines (prescription, over-the-counter, or herbal) for morning sickness without first talking to your health care provider. Your health care provider may prescribe or recommend: °· Vitamin B6 supplements. °· Anti-nausea medicines. °· The herbal medicine ginger. ° °Follow these instructions at home: °· Only take over-the-counter or prescription medicines as directed by your health care provider. °· Taking multivitamins before getting pregnant can prevent or decrease the severity of morning sickness in most women. °· Eat a piece of dry toast or unsalted crackers before getting out of bed in the morning. °· Eat five or six small meals a day. °· Eat dry and bland foods (rice, baked potato). Foods high in carbohydrates are often helpful. °· Do not drink liquids with your meals. Drink liquids between meals. °· Avoid greasy, fatty, and spicy foods. °· Get someone to cook for   you if the smell of any food causes nausea and vomiting. °· If you feel nauseous after taking prenatal vitamins, take the vitamins at night or with a snack. °· Snack on protein foods (nuts, yogurt, cheese) between meals if you are hungry. °· Eat unsweetened gelatins for desserts. °· Wearing  an acupressure wristband (worn for sea sickness) may be helpful. °· Acupuncture may be helpful. °· Do not smoke. °· Get a humidifier to keep the air in your house free of odors. °· Get plenty of fresh air. °Contact a health care provider if: °· Your home remedies are not working, and you need medicine. °· You feel dizzy or lightheaded. °· You are losing weight. °Get help right away if: °· You have persistent and uncontrolled nausea and vomiting. °· You pass out (faint). °This information is not intended to replace advice given to you by your health care provider. Make sure you discuss any questions you have with your health care provider. °Document Released: 12/01/2006 Document Revised: 03/17/2016 Document Reviewed: 03/27/2013 °Elsevier Interactive Patient Education © 2017 [employer] ° °

## 2017-05-17 ENCOUNTER — Inpatient Hospital Stay (HOSPITAL_COMMUNITY): Payer: Medicaid Other

## 2017-05-17 ENCOUNTER — Encounter (HOSPITAL_COMMUNITY): Payer: Self-pay | Admitting: *Deleted

## 2017-05-17 ENCOUNTER — Inpatient Hospital Stay (HOSPITAL_COMMUNITY)
Admission: AD | Admit: 2017-05-17 | Discharge: 2017-05-17 | Disposition: A | Discharge status: Home / Self Care | Payer: Medicaid Other | Source: Ambulatory Visit | Attending: Obstetrics & Gynecology | Admitting: Obstetrics & Gynecology

## 2017-05-17 ENCOUNTER — Other Ambulatory Visit: Payer: Self-pay | Admitting: Advanced Practice Midwife

## 2017-05-17 DIAGNOSIS — Z825 Family history of asthma and other chronic lower respiratory diseases: Secondary | ICD-10-CM | POA: Insufficient documentation

## 2017-05-17 DIAGNOSIS — E876 Hypokalemia: Secondary | ICD-10-CM | POA: Insufficient documentation

## 2017-05-17 DIAGNOSIS — R1010 Upper abdominal pain, unspecified: Secondary | ICD-10-CM | POA: Diagnosis present

## 2017-05-17 DIAGNOSIS — Z888 Allergy status to other drugs, medicaments and biological substances status: Secondary | ICD-10-CM | POA: Diagnosis not present

## 2017-05-17 DIAGNOSIS — O99613 Diseases of the digestive system complicating pregnancy, third trimester: Secondary | ICD-10-CM | POA: Diagnosis not present

## 2017-05-17 DIAGNOSIS — O99513 Diseases of the respiratory system complicating pregnancy, third trimester: Secondary | ICD-10-CM | POA: Insufficient documentation

## 2017-05-17 DIAGNOSIS — Z880 Allergy status to penicillin: Secondary | ICD-10-CM | POA: Insufficient documentation

## 2017-05-17 DIAGNOSIS — O368130 Decreased fetal movements, third trimester, not applicable or unspecified: Secondary | ICD-10-CM | POA: Diagnosis not present

## 2017-05-17 DIAGNOSIS — K219 Gastro-esophageal reflux disease without esophagitis: Secondary | ICD-10-CM | POA: Diagnosis not present

## 2017-05-17 DIAGNOSIS — R109 Unspecified abdominal pain: Secondary | ICD-10-CM

## 2017-05-17 DIAGNOSIS — Z809 Family history of malignant neoplasm, unspecified: Secondary | ICD-10-CM | POA: Diagnosis not present

## 2017-05-17 DIAGNOSIS — Z3A30 30 weeks gestation of pregnancy: Secondary | ICD-10-CM | POA: Diagnosis not present

## 2017-05-17 DIAGNOSIS — O26893 Other specified pregnancy related conditions, third trimester: Secondary | ICD-10-CM | POA: Insufficient documentation

## 2017-05-17 DIAGNOSIS — O99283 Endocrine, nutritional and metabolic diseases complicating pregnancy, third trimester: Secondary | ICD-10-CM | POA: Insufficient documentation

## 2017-05-17 DIAGNOSIS — O0933 Supervision of pregnancy with insufficient antenatal care, third trimester: Secondary | ICD-10-CM | POA: Insufficient documentation

## 2017-05-17 DIAGNOSIS — Z79899 Other long term (current) drug therapy: Secondary | ICD-10-CM | POA: Insufficient documentation

## 2017-05-17 DIAGNOSIS — J45909 Unspecified asthma, uncomplicated: Secondary | ICD-10-CM | POA: Diagnosis not present

## 2017-05-17 LAB — URINALYSIS, ROUTINE W REFLEX MICROSCOPIC
BILIRUBIN URINE: NEGATIVE
Glucose, UA: NEGATIVE mg/dL
HGB URINE DIPSTICK: NEGATIVE
Ketones, ur: 20 mg/dL — AB
NITRITE: NEGATIVE
PH: 6 (ref 5.0–8.0)
Protein, ur: 30 mg/dL — AB
SPECIFIC GRAVITY, URINE: 1.021 (ref 1.005–1.030)

## 2017-05-17 LAB — RAPID URINE DRUG SCREEN, HOSP PERFORMED
Amphetamines: NOT DETECTED
Barbiturates: NOT DETECTED
Benzodiazepines: NOT DETECTED
COCAINE: NOT DETECTED
OPIATES: NOT DETECTED
Tetrahydrocannabinol: POSITIVE — AB

## 2017-05-17 LAB — WET PREP, GENITAL
CLUE CELLS WET PREP: NONE SEEN
SPERM: NONE SEEN
Trich, Wet Prep: NONE SEEN
Yeast Wet Prep HPF POC: NONE SEEN

## 2017-05-17 LAB — COMPREHENSIVE METABOLIC PANEL
ALBUMIN: 2.8 g/dL — AB (ref 3.5–5.0)
ALT: 8 U/L — ABNORMAL LOW (ref 14–54)
AST: 13 U/L — AB (ref 15–41)
Alkaline Phosphatase: 73 U/L (ref 38–126)
Anion gap: 8 (ref 5–15)
BUN: 7 mg/dL (ref 6–20)
CHLORIDE: 105 mmol/L (ref 101–111)
CO2: 23 mmol/L (ref 22–32)
Calcium: 8.8 mg/dL — ABNORMAL LOW (ref 8.9–10.3)
Creatinine, Ser: 0.38 mg/dL — ABNORMAL LOW (ref 0.44–1.00)
GFR calc Af Amer: >60 mL/min (ref >60)
GFR calc non Af Amer: >60 mL/min (ref >60)
GLUCOSE: 82 mg/dL (ref 65–99)
Potassium: 3 mmol/L — ABNORMAL LOW (ref 3.5–5.1)
SODIUM: 136 mmol/L (ref 135–145)
Total Bilirubin: 0.4 mg/dL (ref 0.3–1.2)
Total Protein: 6 g/dL — ABNORMAL LOW (ref 6.5–8.1)

## 2017-05-17 LAB — CBC
HEMATOCRIT: 28.4 % — AB (ref 36.0–46.0)
Hemoglobin: 9.8 g/dL — ABNORMAL LOW (ref 12.0–15.0)
MCH: 30.2 pg (ref 26.0–34.0)
MCHC: 34.5 g/dL (ref 30.0–36.0)
MCV: 87.4 fL (ref 78.0–100.0)
PLATELETS: 154 10*3/uL (ref 150–400)
RBC: 3.25 MIL/uL — ABNORMAL LOW (ref 3.87–5.11)
RDW: 13.3 % (ref 11.5–15.5)
WBC: 8.9 10*3/uL (ref 4.0–10.5)

## 2017-05-17 LAB — AMYLASE: AMYLASE: 93 U/L (ref 28–100)

## 2017-05-17 LAB — FETAL FIBRONECTIN: FETAL FIBRONECTIN: NEGATIVE

## 2017-05-17 LAB — LIPASE, BLOOD: LIPASE: 22 U/L (ref 11–51)

## 2017-05-17 MED ORDER — ONDANSETRON 4 MG PO TBDP: 4.0000 mg | ORAL_TABLET | Freq: Three times a day (TID) | ORAL | 1 refills | Status: DC | PRN

## 2017-05-17 MED ORDER — FAMOTIDINE IN NACL 20-0.9 MG/50ML-% IV SOLN
20.0000 mg | Freq: Once | INTRAVENOUS | Status: AC
  Administered 2017-05-17: 20 mg via INTRAVENOUS
  Filled 2017-05-17: qty 50

## 2017-05-17 MED ORDER — CYCLOBENZAPRINE HCL 10 MG PO TABS: 10.0000 mg | ORAL_TABLET | Freq: Three times a day (TID) | ORAL | 0 refills | Status: DC | PRN

## 2017-05-17 MED ORDER — FENTANYL CITRATE (PF) 100 MCG/2ML IJ SOLN
100.0000 ug | Freq: Once | INTRAMUSCULAR | Status: AC
  Administered 2017-05-17: 100 ug via INTRAVENOUS
  Filled 2017-05-17: qty 2

## 2017-05-17 MED ORDER — METOCLOPRAMIDE HCL 10 MG PO TABS: 10.0000 mg | ORAL_TABLET | Freq: Four times a day (QID) | ORAL | 2 refills | Status: DC | PRN

## 2017-05-17 MED ORDER — LACTATED RINGERS IV SOLN
INTRAVENOUS | Status: DC
  Administered 2017-05-17: 09:00:00 via INTRAVENOUS

## 2017-05-17 MED ORDER — LACTATED RINGERS IV BOLUS (SEPSIS)
1000.0000 mL | Freq: Once | INTRAVENOUS | Status: AC
  Administered 2017-05-17: 1000 mL via INTRAVENOUS

## 2017-05-17 MED ORDER — PANTOPRAZOLE SODIUM 40 MG PO TBEC: 40.0000 mg | DELAYED_RELEASE_TABLET | Freq: Every day | ORAL | 2 refills | Status: DC

## 2017-05-17 MED ORDER — METOCLOPRAMIDE HCL 5 MG/ML IJ SOLN
10.0000 mg | Freq: Once | INTRAMUSCULAR | Status: AC
  Administered 2017-05-17: 10 mg via INTRAVENOUS
  Filled 2017-05-17: qty 2

## 2017-05-17 NOTE — MAU Provider Note (Signed)
History     CSN: [id number]  Arrival date and time: 05/17/17 4098   First Provider Initiated Contact with Patient 05/17/17 [phone]      Chief Complaint  Patient presents with  . Contractions  . Decreased Fetal Movement   HPI Mercedes [name] is a 26 y.o. [id number] at [redacted]w[redacted]d who presents via EMS with contractions. She states they started at 0100 and are now every 4-6 minutes. She rates the pain a 10/10 and has not tried anything for the pain. She denies any leaking of fluid, vaginal bleeding or vaginal discharge. She also states she has not felt the baby move since 0100. She reports hyperemesis and has thrown up 4 times this morning and has run out of her medication. She has not started [facility] since moving to [place] at 20 weeks.  OB History    Gravida Para Term Preterm AB Living   4 2 2  0 1 2   SAB TAB Ectopic Multiple Live Births   0 0 0 0 2      Past Medical History:  Diagnosis Date  . Anemia   . Asthma   . Hypokalemia     Past Surgical History:  Procedure Laterality Date  . WISDOM TOOTH EXTRACTION      Family History  Problem Relation Age of Onset  . Cancer Mother   . Asthma Mother     Social History  Substance Use Topics  . Smoking status: Never Smoker  . Smokeless tobacco: Never Used  . Alcohol use No    Allergies:  Allergies  Allergen Reactions  . Milk-Related Compounds Other (See Comments)    Unknown-childhood reaction  . Penicillins     Has patient had a PCN reaction causing immediate rash, facial/tongue/throat swelling, SOB or lightheadedness with hypotension: Unknown Has patient had a PCN reaction causing severe rash involving mucus membranes or skin necrosis: Unknown Has patient had a PCN reaction that required hospitalization: Unknown Has patient had a PCN reaction occurring within the last 10 years: Unknown If all of the above answers are "NO", then may proceed with Cephalosporin use.    Prescriptions Prior to Admission  Medication Sig  Dispense Refill Last Dose  . albuterol (PROVENTIL HFA;VENTOLIN HFA) 108 (90 Base) MCG/ACT inhaler Inhale 2 puffs into the lungs every 6 (six) hours as needed for wheezing or shortness of breath.   05/17/2017 at Unknown time  . Prenatal Vit-Fe Fumarate-FA (PRENATAL MULTIVITAMIN) TABS tablet Take 1 tablet by mouth daily at 12 noon.   05/16/2017 at Unknown time  . albuterol (PROVENTIL) (2.5 MG/3ML) 0.083% nebulizer solution Take 3 mLs (2.5 mg total) by nebulization every 6 (six) hours as needed for wheezing or shortness of breath. 75 mL 0 05/15/2017  . ipratropium (ATROVENT) 0.02 % nebulizer solution Take 2.5 mLs (0.5 mg total) by nebulization 4 (four) times daily. 75 mL 12 05/15/2017  . prochlorperazine (COMPAZINE) 10 MG tablet Take 1 tablet (10 mg total) by mouth every 8 (eight) hours as needed for nausea or vomiting. 15 tablet 0 05/10/2017  . [DISCONTINUED] metoCLOPramide (REGLAN) 10 MG tablet Take 1 tablet (10 mg total) by mouth every 6 (six) hours as needed for nausea (nausea/headache). 30 tablet 0 05/03/2017  . [DISCONTINUED] ondansetron (ZOFRAN ODT) 4 MG disintegrating tablet Take 1 tablet (4 mg total) by mouth every 8 (eight) hours as needed for nausea or vomiting. 40 tablet 1 05/16/2017    Review of Systems  Constitutional: Negative.  Negative for chills and fever.  HENT: Negative.  Respiratory: Negative.  Negative for shortness of breath.   Cardiovascular: Negative.  Negative for chest pain.  Gastrointestinal: Positive for abdominal pain, nausea and vomiting. Negative for constipation and diarrhea.  Genitourinary: Negative.  Negative for dysuria, vaginal bleeding and vaginal discharge.  Neurological: Negative.  Negative for dizziness and headaches.  Psychiatric/Behavioral: Negative.    Physical Exam   Blood pressure 110/72, pulse 67, temperature 98.1 F (36.7 C), temperature source Oral, resp. rate 20, last menstrual period 10/28/2016, SpO2 100 %.  Physical Exam  Nursing note and vitals  reviewed. Constitutional: She is oriented to person, place, and time. She appears well-developed and well-nourished.  HENT:  Head: Normocephalic and atraumatic.  Eyes: Conjunctivae are normal. No scleral icterus.  Cardiovascular: Normal rate, regular rhythm and normal heart sounds.   Respiratory: Effort normal and breath sounds normal. No respiratory distress.  GI: Soft. She exhibits no distension. There is no tenderness. There is no guarding.  Neurological: She is alert and oriented to person, place, and time.  Skin: Skin is warm and dry.  Psychiatric: She has a normal mood and affect. Judgment and thought content normal. Her speech is slurred. She is agitated.   Dilation: Closed Effacement (%): Thick Cervical Position: Posterior Exam by:: L. Leftwich-Kirby CNM  MAU Course  Procedures Results for orders placed or performed during the hospital encounter of 05/17/17 (from the past 24 hour(s))  Fetal fibronectin     Status: None   Collection Time: 05/17/17  7:47 AM  Result Value Ref Range   Fetal Fibronectin NEGATIVE NEGATIVE  Wet prep, genital     Status: Abnormal   Collection Time: 05/17/17  7:57 AM  Result Value Ref Range   Yeast Wet Prep HPF POC NONE SEEN NONE SEEN   Trich, Wet Prep NONE SEEN NONE SEEN   Clue Cells Wet Prep HPF POC NONE SEEN NONE SEEN   WBC, Wet Prep HPF POC MODERATE (A) NONE SEEN   Sperm NONE SEEN   Urinalysis, Routine w reflex microscopic     Status: Abnormal   Collection Time: 05/17/17  8:25 AM  Result Value Ref Range   Color, Urine YELLOW YELLOW   APPearance HAZY (A) CLEAR   Specific Gravity, Urine 1.021 1.005 - 1.030   pH 6.0 5.0 - 8.0   Glucose, UA NEGATIVE NEGATIVE mg/dL   Hgb urine dipstick NEGATIVE NEGATIVE   Bilirubin Urine NEGATIVE NEGATIVE   Ketones, ur 20 (A) NEGATIVE mg/dL   Protein, ur 30 (A) NEGATIVE mg/dL   Nitrite NEGATIVE NEGATIVE   Leukocytes, UA SMALL (A) NEGATIVE   RBC / HPF 0-5 0 - 5 RBC/hpf   WBC, UA 0-5 0 - 5 WBC/hpf    Bacteria, UA RARE (A) NONE SEEN   Squamous Epithelial / LPF 6-30 (A) NONE SEEN   Mucous PRESENT   Urine rapid drug screen (hosp performed)     Status: Abnormal   Collection Time: 05/17/17  8:25 AM  Result Value Ref Range   Opiates NONE DETECTED NONE DETECTED   Cocaine NONE DETECTED NONE DETECTED   Benzodiazepines NONE DETECTED NONE DETECTED   Amphetamines NONE DETECTED NONE DETECTED   Tetrahydrocannabinol POSITIVE (A) NONE DETECTED   Barbiturates NONE DETECTED NONE DETECTED  CBC     Status: Abnormal   Collection Time: 05/17/17  8:54 AM  Result Value Ref Range   WBC 8.9 4.0 - 10.5 K/uL   RBC 3.25 (L) 3.87 - 5.11 MIL/uL   Hemoglobin 9.8 (L) 12.0 - 15.0 g/dL   HCT  28.4 (L) 36.0 - 46.0 %   MCV 87.4 78.0 - 100.0 fL   MCH 30.2 26.0 - 34.0 pg   MCHC 34.5 30.0 - 36.0 g/dL   RDW 65.713.3 84.611.5 - 96.215.5 %   Platelets 154 150 - 400 K/uL  Comprehensive metabolic panel     Status: Abnormal   Collection Time: 05/17/17  8:54 AM  Result Value Ref Range   Sodium 136 135 - 145 mmol/L   Potassium 3.0 (L) 3.5 - 5.1 mmol/L   Chloride 105 101 - 111 mmol/L   CO2 23 22 - 32 mmol/L   Glucose, Bld 82 65 - 99 mg/dL   BUN 7 6 - 20 mg/dL   Creatinine, Ser 9.520.38 (L) 0.44 - 1.00 mg/dL   Calcium 8.8 (L) 8.9 - 10.3 mg/dL   Total Protein 6.0 (L) 6.5 - 8.1 g/dL   Albumin 2.8 (L) 3.5 - 5.0 g/dL   AST 13 (L) 15 - 41 U/L   ALT 8 (L) 14 - 54 U/L   Alkaline Phosphatase 73 38 - 126 U/L   Total Bilirubin 0.4 0.3 - 1.2 mg/dL   GFR calc non Af Amer >60 >60 mL/min   GFR calc Af Amer >60 >60 mL/min   Anion gap 8 5 - 15  Amylase     Status: None   Collection Time: 05/17/17  8:54 AM  Result Value Ref Range   Amylase 93 28 - 100 U/L  Lipase, blood     Status: None   Collection Time: 05/17/17  8:54 AM  Result Value Ref Range   Lipase 22 11 - 51 U/L    MDM UA, UDS Wet prep and gc/chlamydia FFN CBC, CMP, Amylase, Lipase LR bolus Patient refused nausea medication US MFM OB Limited  Assessment and Plan   I confirm  that I have verified the information documented in the student midwife's note and that I have also personally performed the physical exam and all medical decision making activities.See results, imaging, and completion of evaluation and plan below.  Sharen CounterLisa Leftwich-Kirby, CNM  Results for orders placed or performed during the hospital encounter of 05/17/17 (from the past 24 hour(s))  Fetal fibronectin     Status: None   Collection Time: 05/17/17  7:47 AM  Result Value Ref Range   Fetal Fibronectin NEGATIVE NEGATIVE  Wet prep, genital     Status: Abnormal   Collection Time: 05/17/17  7:57 AM  Result Value Ref Range   Yeast Wet Prep HPF POC NONE SEEN NONE SEEN   Trich, Wet Prep NONE SEEN NONE SEEN   Clue Cells Wet Prep HPF POC NONE SEEN NONE SEEN   WBC, Wet Prep HPF POC MODERATE (A) NONE SEEN   Sperm NONE SEEN   Urinalysis, Routine w reflex microscopic     Status: Abnormal   Collection Time: 05/17/17  8:25 AM  Result Value Ref Range   Color, Urine YELLOW YELLOW   APPearance HAZY (A) CLEAR   Specific Gravity, Urine 1.021 1.005 - 1.030   pH 6.0 5.0 - 8.0   Glucose, UA NEGATIVE NEGATIVE mg/dL   Hgb urine dipstick NEGATIVE NEGATIVE   Bilirubin Urine NEGATIVE NEGATIVE   Ketones, ur 20 (A) NEGATIVE mg/dL   Protein, ur 30 (A) NEGATIVE mg/dL   Nitrite NEGATIVE NEGATIVE   Leukocytes, UA SMALL (A) NEGATIVE   RBC / HPF 0-5 0 - 5 RBC/hpf   WBC, UA 0-5 0 - 5 WBC/hpf   Bacteria, UA RARE (A) NONE SEEN  Squamous Epithelial / LPF 6-30 (A) NONE SEEN   Mucous PRESENT   Urine rapid drug screen (hosp performed)     Status: Abnormal   Collection Time: 05/17/17  8:25 AM  Result Value Ref Range   Opiates NONE DETECTED NONE DETECTED   Cocaine NONE DETECTED NONE DETECTED   Benzodiazepines NONE DETECTED NONE DETECTED   Amphetamines NONE DETECTED NONE DETECTED   Tetrahydrocannabinol POSITIVE (A) NONE DETECTED   Barbiturates NONE DETECTED NONE DETECTED  CBC     Status: Abnormal   Collection Time:  05/17/17  8:54 AM  Result Value Ref Range   WBC 8.9 4.0 - 10.5 K/uL   RBC 3.25 (L) 3.87 - 5.11 MIL/uL   Hemoglobin 9.8 (L) 12.0 - 15.0 g/dL   HCT 16.1 (L) 09.6 - 04.5 %   MCV 87.4 78.0 - 100.0 fL   MCH 30.2 26.0 - 34.0 pg   MCHC 34.5 30.0 - 36.0 g/dL   RDW 40.9 81.1 - 91.4 %   Platelets 154 150 - 400 K/uL  Comprehensive metabolic panel     Status: Abnormal   Collection Time: 05/17/17  8:54 AM  Result Value Ref Range   Sodium 136 135 - 145 mmol/L   Potassium 3.0 (L) 3.5 - 5.1 mmol/L   Chloride 105 101 - 111 mmol/L   CO2 23 22 - 32 mmol/L   Glucose, Bld 82 65 - 99 mg/dL   BUN 7 6 - 20 mg/dL   Creatinine, Ser 7.82 (L) 0.44 - 1.00 mg/dL   Calcium 8.8 (L) 8.9 - 10.3 mg/dL   Total Protein 6.0 (L) 6.5 - 8.1 g/dL   Albumin 2.8 (L) 3.5 - 5.0 g/dL   AST 13 (L) 15 - 41 U/L   ALT 8 (L) 14 - 54 U/L   Alkaline Phosphatase 73 38 - 126 U/L   Total Bilirubin 0.4 0.3 - 1.2 mg/dL   GFR calc non Af Amer >60 >60 mL/min   GFR calc Af Amer >60 >60 mL/min   Anion gap 8 5 - 15  Amylase     Status: None   Collection Time: 05/17/17  8:54 AM  Result Value Ref Range   Amylase 93 28 - 100 U/L  Lipase, blood     Status: None   Collection Time: 05/17/17  8:54 AM  Result Value Ref Range   Lipase 22 11 - 51 U/L    Imaging:   Preliminary report for Limited OB US at MFM FHR, AFI, placenta wnl, no evidence of abruption or previa  A: 1. Limited prenatal care in third trimester   2. Abdominal pain during pregnancy, third trimester   3. [redacted] weeks gestation of pregnancy   4. Upper abdominal pain of unknown etiology    P: Consult Dr Macon Large with presentation and findings No evidence of preterm labor or other pathology today Will treat for acid reflux, nausea, and musculoskeletal pain Rx for Flexeril, Reglan, Zofran, and Protonix sent to pharmacy Message sent to transfer care to [facility], records requested Pt to return to MAU as needed for emergencies     Sharen Counter, CNM 1:21 PM 8:48 PM

## 2017-05-17 NOTE — MAU Note (Signed)
Pt came in by EMS, states she started having uc's around 0100, denies bleeding or LOF.  Very uncomfortable during contractions.

## 2017-05-18 LAB — GC/CHLAMYDIA PROBE AMP (~~LOC~~) NOT AT ARMC
CHLAMYDIA, DNA PROBE: NEGATIVE
NEISSERIA GONORRHEA: NEGATIVE

## 2017-05-25 ENCOUNTER — Encounter: Payer: Medicaid Other | Admitting: Advanced Practice Midwife

## 2017-05-25 ENCOUNTER — Encounter: Payer: Self-pay | Admitting: Family Medicine

## 2017-06-03 ENCOUNTER — Encounter (HOSPITAL_COMMUNITY): Payer: Self-pay

## 2017-06-03 ENCOUNTER — Inpatient Hospital Stay (HOSPITAL_COMMUNITY)
Admission: AD | Admit: 2017-06-03 | Discharge: 2017-06-03 | Disposition: A | Discharge status: Home / Self Care | Payer: Medicaid Other | Source: Ambulatory Visit | Attending: Family Medicine | Admitting: Family Medicine

## 2017-06-03 DIAGNOSIS — Z3A31 31 weeks gestation of pregnancy: Secondary | ICD-10-CM | POA: Diagnosis not present

## 2017-06-03 DIAGNOSIS — Z880 Allergy status to penicillin: Secondary | ICD-10-CM | POA: Diagnosis not present

## 2017-06-03 DIAGNOSIS — Z3689 Encounter for other specified antenatal screening: Secondary | ICD-10-CM

## 2017-06-03 DIAGNOSIS — B9689 Other specified bacterial agents as the cause of diseases classified elsewhere: Secondary | ICD-10-CM | POA: Diagnosis not present

## 2017-06-03 DIAGNOSIS — O99513 Diseases of the respiratory system complicating pregnancy, third trimester: Secondary | ICD-10-CM | POA: Diagnosis not present

## 2017-06-03 DIAGNOSIS — N760 Acute vaginitis: Secondary | ICD-10-CM | POA: Diagnosis not present

## 2017-06-03 DIAGNOSIS — O23593 Infection of other part of genital tract in pregnancy, third trimester: Secondary | ICD-10-CM | POA: Diagnosis not present

## 2017-06-03 DIAGNOSIS — J45909 Unspecified asthma, uncomplicated: Secondary | ICD-10-CM | POA: Insufficient documentation

## 2017-06-03 DIAGNOSIS — Z91011 Allergy to milk products: Secondary | ICD-10-CM | POA: Insufficient documentation

## 2017-06-03 DIAGNOSIS — M549 Dorsalgia, unspecified: Secondary | ICD-10-CM | POA: Diagnosis present

## 2017-06-03 DIAGNOSIS — Z825 Family history of asthma and other chronic lower respiratory diseases: Secondary | ICD-10-CM | POA: Insufficient documentation

## 2017-06-03 DIAGNOSIS — O26893 Other specified pregnancy related conditions, third trimester: Secondary | ICD-10-CM | POA: Diagnosis not present

## 2017-06-03 DIAGNOSIS — O4703 False labor before 37 completed weeks of gestation, third trimester: Secondary | ICD-10-CM

## 2017-06-03 DIAGNOSIS — O9989 Other specified diseases and conditions complicating pregnancy, childbirth and the puerperium: Secondary | ICD-10-CM

## 2017-06-03 DIAGNOSIS — O99891 Other specified diseases and conditions complicating pregnancy: Secondary | ICD-10-CM

## 2017-06-03 LAB — WET PREP, GENITAL
SPERM: NONE SEEN
TRICH WET PREP: NONE SEEN
YEAST WET PREP: NONE SEEN

## 2017-06-03 LAB — URINALYSIS, ROUTINE W REFLEX MICROSCOPIC
Bilirubin Urine: NEGATIVE
GLUCOSE, UA: NEGATIVE mg/dL
HGB URINE DIPSTICK: NEGATIVE
Ketones, ur: NEGATIVE mg/dL
LEUKOCYTES UA: NEGATIVE
Nitrite: NEGATIVE
PH: 7 (ref 5.0–8.0)
Protein, ur: NEGATIVE mg/dL
SPECIFIC GRAVITY, URINE: 1.018 (ref 1.005–1.030)

## 2017-06-03 MED ORDER — CYCLOBENZAPRINE HCL 10 MG PO TABS
10.0000 mg | ORAL_TABLET | Freq: Three times a day (TID) | ORAL | Status: DC | PRN
  Administered 2017-06-03: 10 mg via ORAL
  Filled 2017-06-03: qty 1

## 2017-06-03 MED ORDER — METRONIDAZOLE 500 MG PO TABS: 500.0000 mg | ORAL_TABLET | Freq: Two times a day (BID) | ORAL | 0 refills | Status: DC

## 2017-06-03 MED ORDER — ACETAMINOPHEN 500 MG PO TABS
1000.0000 mg | ORAL_TABLET | Freq: Four times a day (QID) | ORAL | Status: DC | PRN
  Administered 2017-06-03: 1000 mg via ORAL
  Filled 2017-06-03: qty 2

## 2017-06-03 NOTE — Discharge Instructions (Signed)
Back Pain in Pregnancy °Back pain during pregnancy is common. Back pain may be caused by several factors that are related to changes during your pregnancy. °Follow these instructions at home: °Managing pain, stiffness, and swelling °· If directed, apply ice for sudden (acute) back pain. °? Put ice in a plastic bag. °? Place a towel between your skin and the bag. °? Leave the ice on for 20 minutes, 2-3 times per day. °· If directed, apply heat to the affected area before you exercise: °? Place a towel between your skin and the heat pack or heating pad. °? Leave the heat on for 20-30 minutes. °? Remove the heat if your skin turns bright red. This is especially important if you are unable to feel pain, heat, or cold. You may have a greater risk of getting burned. °Activity °· Exercise as told by your health care provider. Exercising is the best way to prevent or manage back pain. °· Listen to your body when lifting. If lifting hurts, ask for help or bend your knees. This uses your leg muscles instead of your back muscles. °· Squat down when picking up something from the floor. Do not bend over. °· Only use bed rest as told by your health care provider. Bed rest should only be used for the most severe episodes of back pain. °Standing, Sitting, and Lying Down °· Do not stand in one place for long periods of time. °· Use good posture when sitting. Make sure your head rests over your shoulders and is not hanging forward. Use a pillow on your lower back if necessary. °· Try sleeping on your side, preferably the left side, with a pillow or two between your legs. If you are sore after a night's rest, your bed may be too soft. A firm mattress may provide more support for your back during pregnancy. °General instructions °· Do not wear high heels. °· Eat a healthy diet. Try to gain weight within your health care provider's recommendations. °· Use a maternity girdle, elastic sling, or back brace as told by your health care  provider. °· Take over-the-counter and prescription medicines only as told by your health care provider. °· Keep all follow-up visits as told by your health care provider. This is important. This includes any visits with any specialists, such as a physical therapist. °Contact a health care provider if: °· Your back pain interferes with your daily activities. °· You have increasing pain in other parts of your body. °Get help right away if: °· You develop numbness, tingling, weakness, or problems with the use of your arms or legs. °· You develop severe back pain that is not controlled with medicine. °· You have a sudden change in bowel or bladder control. °· You develop shortness of breath, dizziness, or you faint. °· You develop nausea, vomiting, or sweating. °· You have back pain that is a rhythmic, cramping pain similar to labor pains. Labor pain is usually 1-2 minutes apart, lasts for about 1 minute, and involves a bearing down feeling or pressure in your pelvis. °· You have back pain and your water breaks or you have vaginal bleeding. °· You have back pain or numbness that travels down your leg. °· Your back pain developed after you fell. °· You develop pain on one side of your back. °· You see blood in your urine. °· You develop skin blisters in the area of your back pain. °This information is not intended to replace advice given to you   by your health care provider. Make sure you discuss any questions you have with your health care provider. °Document Released: 01/18/2006 Document Revised: 03/17/2016 Document Reviewed: 06/24/2015 °Elsevier Interactive Patient Education © 2018 [employer] ° °Braxton Hicks Contractions °Contractions of the uterus can occur throughout pregnancy, but they are not always a sign that you are in labor. You may have practice contractions called Braxton Hicks contractions. These false labor contractions are sometimes confused with true labor. °What are Braxton Hicks  contractions? °Braxton Hicks contractions are tightening movements that occur in the muscles of the uterus before labor. Unlike true labor contractions, these contractions do not result in opening (dilation) and thinning of the cervix. Toward the end of pregnancy (32-34 weeks), Braxton Hicks contractions can happen more often and may become stronger. These contractions are sometimes difficult to tell apart from true labor because they can be very uncomfortable. You should not feel embarrassed if you go to the hospital with false labor. °Sometimes, the only way to tell if you are in true labor is for your health care provider to look for changes in the cervix. The health care provider will do a physical exam and may monitor your contractions. If you are not in true labor, the exam should show that your cervix is not dilating and your water has not broken. °If there are no prenatal problems or other health problems associated with your pregnancy, it is completely safe for you to be sent home with false labor. You may continue to have Braxton Hicks contractions until you go into true labor. °How can I tell the difference between true labor and false labor? °· Differences °? False labor °? Contractions last 30-70 seconds.: Contractions are usually shorter and not as strong as true labor contractions. °? Contractions become very regular.: Contractions are usually irregular. °? Discomfort is usually felt in the top of the uterus, and it spreads to the lower abdomen and low back.: Contractions are often felt in the front of the lower abdomen and in the groin. °? Contractions do not go away with walking.: Contractions may go away when you walk around or change positions while lying down. °? Contractions usually become more intense and increase in frequency.: Contractions get weaker and are shorter-lasting as time goes on. °? The cervix dilates and gets thinner.: The cervix usually does not dilate or become thin. °Follow  these instructions at home: °· Take over-the-counter and prescription medicines only as told by your health care provider. °· Keep up with your usual exercises and follow other instructions from your health care provider. °· Eat and drink lightly if you think you are going into labor. °· If Braxton Hicks contractions are making you uncomfortable: °? Change your position from lying down or resting to walking, or change from walking to resting. °? Sit and rest in a tub of warm water. °? Drink enough fluid to keep your urine clear or pale yellow. Dehydration may cause these contractions. °? Do slow and deep breathing several times an hour. °· Keep all follow-up prenatal visits as told by your health care provider. This is important. °Contact a health care provider if: °· You have a fever. °· You have continuous pain in your abdomen. °Get help right away if: °· Your contractions become stronger, more regular, and closer together. °· You have fluid leaking or gushing from your vagina. °· You pass blood-tinged mucus (bloody show). °· You have bleeding from your vagina. °· You have low back pain that you   never had before. °· You feel your baby’s head pushing down and causing pelvic pressure. °· Your baby is not moving inside you as much as it used to. °Summary °· Contractions that occur before labor are called Braxton Hicks contractions, false labor, or practice contractions. °· Braxton Hicks contractions are usually shorter, weaker, farther apart, and less regular than true labor contractions. True labor contractions usually become progressively stronger and regular and they become more frequent. °· Manage discomfort from Braxton Hicks contractions by changing position, resting in a warm bath, drinking plenty of water, or practicing deep breathing. °This information is not intended to replace advice given to you by your health care provider. Make sure you discuss any questions you have with your health care  provider. °Document Released: 10/10/2005 Document Revised: 08/29/2016 Document Reviewed: 08/29/2016 °Elsevier Interactive Patient Education © 2017 [employer] ° °

## 2017-06-03 NOTE — MAU Note (Signed)
Pt started having ctx at 0430 this morning. [place], has not started any local prenatal care.

## 2017-06-03 NOTE — MAU Provider Note (Signed)
History     CSN: [id number]  Arrival date and time: 06/03/17 1056   First Provider Initiated Contact with Patient 06/03/17 1121      Chief Complaint  Patient presents with  . Contractions   [id number] @31 .1 wks arrived via EMS for ctx and back pain. Sx started around 0400 this am. Ctx are q5-10 min and back pain coincides. Denies VB but reports clear vaginal discharge x2 days. Reports good FM. [facility] [place] and last appt was in May. She was scheduled for appt in [facility] earlier this month but did not show, she claims was unaware of appt. Reports transportation issues but is trying to arrange this for upcoming appts.   OB History    Gravida Para Term Preterm AB Living   4 2 2  0 1 2   SAB TAB Ectopic Multiple Live Births   0 0 0 0 2      Past Medical History:  Diagnosis Date  . Anemia   . Asthma   . Hypokalemia     Past Surgical History:  Procedure Laterality Date  . WISDOM TOOTH EXTRACTION      Family History  Problem Relation Age of Onset  . Cancer Mother   . Asthma Mother     Social History  Substance Use Topics  . Smoking status: Never Smoker  . Smokeless tobacco: Never Used  . Alcohol use No    Allergies:  Allergies  Allergen Reactions  . Milk-Related Compounds Other (See Comments)    Reaction:  Unknown   . Penicillins Other (See Comments)    Reaction:  Unknown  Has patient had a PCN reaction causing immediate rash, facial/tongue/throat swelling, SOB or lightheadedness with hypotension: Unknown Has patient had a PCN reaction causing severe rash involving mucus membranes or skin necrosis: Unknown Has patient had a PCN reaction that required hospitalization: Unknown Has patient had a PCN reaction occurring within the last 10 years: No If all of the above answers are "NO", then may proceed with Cephalosporin use.    Prescriptions Prior to Admission  Medication Sig Dispense Refill Last Dose  . albuterol (PROVENTIL HFA;VENTOLIN HFA) 108 (90  Base) MCG/ACT inhaler Inhale 2 puffs into the lungs every 6 (six) hours as needed for wheezing or shortness of breath.   Past Month at Unknown time  . albuterol (PROVENTIL) (2.5 MG/3ML) 0.083% nebulizer solution Take 3 mLs (2.5 mg total) by nebulization every 6 (six) hours as needed for wheezing or shortness of breath. 75 mL 0 Past Week at Unknown time  . metoCLOPramide (REGLAN) 10 MG tablet Take 1 tablet (10 mg total) by mouth every 6 (six) hours as needed for nausea (nausea/headache). 30 tablet 2 Past Week at Unknown time  . Prenatal Vit-Fe Fumarate-FA (PRENATAL MULTIVITAMIN) TABS tablet Take 1 tablet by mouth daily at 12 noon.   06/02/2017 at Unknown time  . cyclobenzaprine (FLEXERIL) 10 MG tablet Take 1 tablet (10 mg total) by mouth every 8 (eight) hours as needed for muscle spasms. (Patient not taking: Reported on 06/03/2017) 20 tablet 0 Not Taking at Unknown time    Review of Systems  Constitutional: Negative for fever.  Gastrointestinal: Positive for abdominal pain.  Genitourinary: Positive for vaginal discharge. Negative for dysuria, frequency, hematuria, urgency and vaginal bleeding.   Physical Exam   Blood pressure 119/65, pulse 74, temperature 97.9 F (36.6 C), temperature source Oral, resp. rate 18, last menstrual period 10/28/2016.  Physical Exam  Constitutional: She is oriented to person,  place, and time. She appears well-developed and well-nourished. No distress.  HENT:  Head: Normocephalic and atraumatic.  Neck: Normal range of motion.  Cardiovascular: Normal rate.   Respiratory: Effort normal. No respiratory distress.  GI: Soft. She exhibits no distension. There is no tenderness.  gravid  Genitourinary:  Genitourinary Comments: External: no lesions or erythema SSE: vagina: rugated, pink, moist, mod white malodorous discharge, no pool, fern neg SVE closed/thick   Musculoskeletal: Normal range of motion.  Neurological: She is alert and oriented to person, place, and time.   Skin: Skin is warm and dry.  Psychiatric: She has a normal mood and affect.   EFM: 125 bpm, mod variability, + accels, no decels Toco: rare irritability  Results for orders placed or performed during the hospital encounter of 06/03/17 (from the past 24 hour(s))  Urinalysis, Routine w reflex microscopic     Status: Abnormal   Collection Time: 06/03/17 11:10 AM  Result Value Ref Range   Color, Urine YELLOW YELLOW   APPearance CLOUDY (A) CLEAR   Specific Gravity, Urine 1.018 1.005 - 1.030   pH 7.0 5.0 - 8.0   Glucose, UA NEGATIVE NEGATIVE mg/dL   Hgb urine dipstick NEGATIVE NEGATIVE   Bilirubin Urine NEGATIVE NEGATIVE   Ketones, ur NEGATIVE NEGATIVE mg/dL   Protein, ur NEGATIVE NEGATIVE mg/dL   Nitrite NEGATIVE NEGATIVE   Leukocytes, UA NEGATIVE NEGATIVE  Wet prep, genital     Status: Abnormal   Collection Time: 06/03/17 11:30 AM  Result Value Ref Range   Yeast Wet Prep HPF POC NONE SEEN NONE SEEN   Trich, Wet Prep NONE SEEN NONE SEEN   Clue Cells Wet Prep HPF POC PRESENT (A) NONE SEEN   WBC, Wet Prep HPF POC MANY (A) NONE SEEN   Sperm NONE SEEN    MAU Course  Procedures Flexeril Tylenol Po hydration  MDM Labs ordered and reviewed. Modest improvement of pain. No evidence of UTI, PTL or SROM. Will treat BV. Recommend pt maintain hydration and may use warm baths/shower prn comfort. Strict PTL precautions discussed. Stable for discharge home.   Assessment and Plan   1. Back pain affecting pregnancy in third trimester   2. [redacted] weeks gestation of pregnancy   3. Preterm uterine contractions in third trimester, antepartum   4. NST (non-stress test) reactive   5. Bacterial vaginosis    Discharge home Follow up with [facility] to start care-message sent to admin pool PTL precautions Rx Flagyl  Allergies as of 06/03/2017      Reactions   Milk-related Compounds Other (See Comments)   Reaction:  Unknown    Penicillins Other (See Comments)   Reaction:  Unknown  Has patient had a  PCN reaction causing immediate rash, facial/tongue/throat swelling, SOB or lightheadedness with hypotension: Unknown Has patient had a PCN reaction causing severe rash involving mucus membranes or skin necrosis: Unknown Has patient had a PCN reaction that required hospitalization: Unknown Has patient had a PCN reaction occurring within the last 10 years: No If all of the above answers are "NO", then may proceed with Cephalosporin use.      Medication List    TAKE these medications   albuterol 108 (90 Base) MCG/ACT inhaler Commonly known as:  PROVENTIL HFA;VENTOLIN HFA Inhale 2 puffs into the lungs every 6 (six) hours as needed for wheezing or shortness of breath.   albuterol (2.5 MG/3ML) 0.083% nebulizer solution Commonly known as:  PROVENTIL Take 3 mLs (2.5 mg total) by nebulization every 6 (six)  hours as needed for wheezing or shortness of breath.   cyclobenzaprine 10 MG tablet Commonly known as:  FLEXERIL Take 1 tablet (10 mg total) by mouth every 8 (eight) hours as needed for muscle spasms.   metoCLOPramide 10 MG tablet Commonly known as:  REGLAN Take 1 tablet (10 mg total) by mouth every 6 (six) hours as needed for nausea (nausea/headache).   metroNIDAZOLE 500 MG tablet Commonly known as:  FLAGYL Take 1 tablet (500 mg total) by mouth 2 (two) times daily.   prenatal multivitamin Tabs tablet Take 1 tablet by mouth daily at 12 noon.      Donette Larry, CNM 06/03/2017, 1:16 PM

## 2017-06-03 NOTE — MAU Note (Signed)
Pt arrived by EMS, having ctx.

## 2017-06-03 NOTE — MAU Note (Signed)
Urine in lab 

## 2017-06-04 ENCOUNTER — Inpatient Hospital Stay (HOSPITAL_COMMUNITY)
Admission: AD | Admit: 2017-06-04 | Discharge: 2017-06-04 | Disposition: A | Discharge status: Home / Self Care | Payer: Medicaid Other | Source: Ambulatory Visit | Attending: Family Medicine | Admitting: Family Medicine

## 2017-06-04 ENCOUNTER — Inpatient Hospital Stay (EMERGENCY_DEPARTMENT_HOSPITAL)
Admission: AD | Admit: 2017-06-04 | Discharge: 2017-06-04 | Disposition: A | Discharge status: Home / Self Care | Payer: Medicaid Other | Source: Ambulatory Visit | Attending: Obstetrics & Gynecology | Admitting: Obstetrics & Gynecology

## 2017-06-04 ENCOUNTER — Encounter (HOSPITAL_COMMUNITY): Payer: Self-pay | Admitting: *Deleted

## 2017-06-04 ENCOUNTER — Inpatient Hospital Stay (HOSPITAL_COMMUNITY): Payer: Medicaid Other

## 2017-06-04 DIAGNOSIS — Z880 Allergy status to penicillin: Secondary | ICD-10-CM | POA: Diagnosis not present

## 2017-06-04 DIAGNOSIS — O99283 Endocrine, nutritional and metabolic diseases complicating pregnancy, third trimester: Secondary | ICD-10-CM | POA: Insufficient documentation

## 2017-06-04 DIAGNOSIS — E876 Hypokalemia: Secondary | ICD-10-CM

## 2017-06-04 DIAGNOSIS — Z79899 Other long term (current) drug therapy: Secondary | ICD-10-CM | POA: Diagnosis not present

## 2017-06-04 DIAGNOSIS — O4703 False labor before 37 completed weeks of gestation, third trimester: Secondary | ICD-10-CM | POA: Diagnosis not present

## 2017-06-04 DIAGNOSIS — J45909 Unspecified asthma, uncomplicated: Secondary | ICD-10-CM | POA: Insufficient documentation

## 2017-06-04 DIAGNOSIS — O0933 Supervision of pregnancy with insufficient antenatal care, third trimester: Secondary | ICD-10-CM | POA: Diagnosis not present

## 2017-06-04 DIAGNOSIS — O479 False labor, unspecified: Secondary | ICD-10-CM

## 2017-06-04 DIAGNOSIS — Z3A31 31 weeks gestation of pregnancy: Secondary | ICD-10-CM

## 2017-06-04 DIAGNOSIS — R1010 Upper abdominal pain, unspecified: Secondary | ICD-10-CM | POA: Diagnosis not present

## 2017-06-04 DIAGNOSIS — O99513 Diseases of the respiratory system complicating pregnancy, third trimester: Secondary | ICD-10-CM | POA: Diagnosis not present

## 2017-06-04 DIAGNOSIS — R109 Unspecified abdominal pain: Secondary | ICD-10-CM | POA: Diagnosis not present

## 2017-06-04 DIAGNOSIS — O26893 Other specified pregnancy related conditions, third trimester: Secondary | ICD-10-CM | POA: Diagnosis present

## 2017-06-04 LAB — COMPREHENSIVE METABOLIC PANEL
ALBUMIN: 3.1 g/dL — AB (ref 3.5–5.0)
ALT: 7 U/L — AB (ref 14–54)
ALT: 7 U/L — AB (ref 14–54)
AST: 14 U/L — ABNORMAL LOW (ref 15–41)
AST: 13 U/L — AB (ref 15–41)
Albumin: 3 g/dL — ABNORMAL LOW (ref 3.5–5.0)
Alkaline Phosphatase: 114 U/L (ref 38–126)
Alkaline Phosphatase: 107 U/L (ref 38–126)
Anion gap: 13 (ref 5–15)
Anion gap: 13 (ref 5–15)
BILIRUBIN TOTAL: 1 mg/dL (ref 0.3–1.2)
BUN: 7 mg/dL (ref 6–20)
BUN: 7 mg/dL (ref 6–20)
CALCIUM: 9 mg/dL (ref 8.9–10.3)
CHLORIDE: 102 mmol/L (ref 101–111)
CO2: 20 mmol/L — ABNORMAL LOW (ref 22–32)
CO2: 22 mmol/L (ref 22–32)
CREATININE: 0.48 mg/dL (ref 0.44–1.00)
Calcium: 9.1 mg/dL (ref 8.9–10.3)
Chloride: 102 mmol/L (ref 101–111)
Creatinine, Ser: 0.44 mg/dL (ref 0.44–1.00)
GFR calc Af Amer: >60 mL/min (ref >60)
GLUCOSE: 71 mg/dL (ref 65–99)
Glucose, Bld: 80 mg/dL (ref 65–99)
POTASSIUM: 3.2 mmol/L — AB (ref 3.5–5.1)
Potassium: 3.5 mmol/L (ref 3.5–5.1)
Sodium: 135 mmol/L (ref 135–145)
Sodium: 137 mmol/L (ref 135–145)
TOTAL PROTEIN: 6.7 g/dL (ref 6.5–8.1)
TOTAL PROTEIN: 7.1 g/dL (ref 6.5–8.1)
Total Bilirubin: 1 mg/dL (ref 0.3–1.2)

## 2017-06-04 LAB — CBC
HCT: 30.8 % — ABNORMAL LOW (ref 36.0–46.0)
HEMATOCRIT: 30.2 % — AB (ref 36.0–46.0)
Hemoglobin: 10.5 g/dL — ABNORMAL LOW (ref 12.0–15.0)
Hemoglobin: 10.3 g/dL — ABNORMAL LOW (ref 12.0–15.0)
MCH: 29.8 pg (ref 26.0–34.0)
MCH: 30.1 pg (ref 26.0–34.0)
MCHC: 34.1 g/dL (ref 30.0–36.0)
MCHC: 34.1 g/dL (ref 30.0–36.0)
MCV: 87.5 fL (ref 78.0–100.0)
MCV: 88.3 fL (ref 78.0–100.0)
PLATELETS: 201 10*3/uL (ref 150–400)
PLATELETS: 173 10*3/uL (ref 150–400)
RBC: 3.52 MIL/uL — AB (ref 3.87–5.11)
RBC: 3.42 MIL/uL — AB (ref 3.87–5.11)
RDW: 13.5 % (ref 11.5–15.5)
RDW: 13.4 % (ref 11.5–15.5)
WBC: 10.7 10*3/uL — AB (ref 4.0–10.5)
WBC: 9.2 10*3/uL (ref 4.0–10.5)

## 2017-06-04 LAB — RAPID URINE DRUG SCREEN, HOSP PERFORMED
Amphetamines: NOT DETECTED
Barbiturates: NOT DETECTED
Benzodiazepines: NOT DETECTED
Cocaine: NOT DETECTED
OPIATES: NOT DETECTED
Tetrahydrocannabinol: POSITIVE — AB

## 2017-06-04 LAB — URINALYSIS, ROUTINE W REFLEX MICROSCOPIC
BACTERIA UA: NONE SEEN
BILIRUBIN URINE: NEGATIVE
Glucose, UA: NEGATIVE mg/dL
HGB URINE DIPSTICK: NEGATIVE
Ketones, ur: 80 mg/dL — AB
LEUKOCYTES UA: NEGATIVE
NITRITE: NEGATIVE
PROTEIN: 30 mg/dL — AB
SPECIFIC GRAVITY, URINE: 1.025 (ref 1.005–1.030)
pH: 6 (ref 5.0–8.0)

## 2017-06-04 LAB — LIPASE, BLOOD
LIPASE: 23 U/L (ref 11–51)
Lipase: 23 U/L (ref 11–51)

## 2017-06-04 LAB — AMYLASE: Amylase: 122 U/L — ABNORMAL HIGH (ref 28–100)

## 2017-06-04 MED ORDER — PROMETHAZINE HCL 25 MG PO TABS: 12.5000 mg | ORAL_TABLET | Freq: Four times a day (QID) | ORAL | 0 refills | Status: DC | PRN

## 2017-06-04 MED ORDER — ONDANSETRON HCL 4 MG/2ML IJ SOLN
4.0000 mg | Freq: Once | INTRAMUSCULAR | Status: AC
  Administered 2017-06-04: 4 mg via INTRAVENOUS
  Filled 2017-06-04: qty 2

## 2017-06-04 MED ORDER — HYDROCODONE-ACETAMINOPHEN 5-325 MG PO TABS
2.0000 | ORAL_TABLET | Freq: Once | ORAL | Status: AC
  Administered 2017-06-04: 2 via ORAL
  Filled 2017-06-04: qty 2

## 2017-06-04 MED ORDER — POTASSIUM CHLORIDE CRYS ER 20 MEQ PO TBCR
40.0000 meq | EXTENDED_RELEASE_TABLET | Freq: Once | ORAL | Status: AC
  Administered 2017-06-04: 40 meq via ORAL
  Filled 2017-06-04: qty 2

## 2017-06-04 MED ORDER — LACTATED RINGERS IV BOLUS (SEPSIS)
1000.0000 mL | Freq: Once | INTRAVENOUS | Status: AC
  Administered 2017-06-04: 1000 mL via INTRAVENOUS

## 2017-06-04 MED ORDER — HYDROMORPHONE HCL 1 MG/ML IJ SOLN
1.0000 mg | Freq: Once | INTRAMUSCULAR | Status: AC
  Administered 2017-06-04: 1 mg via INTRAVENOUS
  Filled 2017-06-04: qty 1

## 2017-06-04 MED ORDER — POTASSIUM CHLORIDE CRYS ER 20 MEQ PO TBCR: 40.0000 meq | EXTENDED_RELEASE_TABLET | Freq: Every day | ORAL | 0 refills | Status: DC

## 2017-06-04 MED ORDER — FENTANYL CITRATE (PF) 100 MCG/2ML IJ SOLN
50.0000 ug | Freq: Once | INTRAMUSCULAR | Status: AC
  Administered 2017-06-04: 50 ug via INTRAVENOUS
  Filled 2017-06-04: qty 2

## 2017-06-04 MED ORDER — PROMETHAZINE HCL 25 MG/ML IJ SOLN
25.0000 mg | Freq: Once | INTRAVENOUS | Status: AC
  Administered 2017-06-04: 25 mg via INTRAVENOUS
  Filled 2017-06-04: qty 1

## 2017-06-04 NOTE — MAU Note (Signed)
Difficult to trace fhr due to patient movement due to pain.

## 2017-06-04 NOTE — MAU Note (Signed)
Pt reports she was here today with abd pain and told she had an infections. Pain has not gottene any better.

## 2017-06-04 NOTE — MAU Note (Signed)
Called SW for consult, will see patient in MAU after 10:00 am

## 2017-06-04 NOTE — Progress Notes (Signed)
CSW met with MOB in MAU to complete assessment for consult regarding multiple ED visit's with [facility]. Upon this writers arrival, MOB was resting in hospital bed. This Probation officer explained role and reasoning for visit. MOB was receptive to this Probation officer and willing to discuss further. CSW inquired about MOB's frequent ED visits. MOB notes she has had a rough pregnancy with a lot of abdominal pain and nausea. CSW inquired if she has been expressing these concerns in her [facility] visits. MOB notes she just relocated here from [place] and has not had [place] as of yet. She further notes that she plans to schedule and appointment tomorrow in clinic for [facility]. MOB notes this is not her first [facility] apt as she was being seen in [place]. CSW was unable to locate any hx of [facility] visits in patients chart at this time and informed MOB that she would need to have her records sent here. MOB verbalized understanding.   CSW inquired about MOB's psychosocial stressors and substance use hx as RN noted MOB's presentation appeared as if she was withdrawing from something. MOB's UDS was only (+) for THC at this time. MOB notes she and FOB are still together and involved. She notes she moved here with there two older daughters aged 95 and 58. CSW inquired about reasoning for move. MOB notes her mother lives here. MOB denies DV/safety concerns and notes she has no substance hx other than THC.   CSW assessed MOB's preparations for baby's arrival. MOB notes she does not have a car seat or crib/basinet. This Probation officer informed MOB that she is 31 weeks and should begin making preparations as baby will be here soon. CSW also informed MOB of the baby box program in case she is unable to obtain a crib/basinet. CSW also informed MOB we have car seats available for $30 upon delivery of the child and eligibility criteria is met. MOB was thankful and verbalized understanding. At this time, MOB expressed no further needs. CSW thanked her for her time.   Mercedes [name],  MSW, LCSW-A Clinical Social Worker  [facility]  Office: [phone]

## 2017-06-04 NOTE — MAU Provider Note (Signed)
History     CSN: [id number]  Arrival date and time: 06/04/17 1191   First Provider Initiated Contact with Patient 06/04/17 [phone]      Chief Complaint  Patient presents with  . Abdominal Pain   Mercedes [name] Staff is a 26 y.o. [phone] at [redacted]w[redacted]d who presents today via EMS with abdominal pain. This is her 3rd visit within 24 hours for this same pain. She was also seen for similar pain on 05/17/17. She states that the pain started yesterday, and it comes and goes. She denies any VB or LOF. She reports normal fetal movement.    Abdominal Pain  This is a new problem. The current episode started in the past 7 days. The problem occurs intermittently. The problem has been unchanged. The pain is located in the periumbilical region. The pain is at a severity of 10/10. The quality of the pain is sharp. The abdominal pain does not radiate. Associated symptoms include nausea and vomiting. Pertinent negatives include no diarrhea, dysuria, fever or frequency. Nothing aggravates the pain. The pain is relieved by nothing. She has tried nothing for the symptoms.    Past Medical History:  Diagnosis Date  . Anemia   . Asthma   . Hypokalemia     Past Surgical History:  Procedure Laterality Date  . WISDOM TOOTH EXTRACTION      Family History  Problem Relation Age of Onset  . Cancer Mother   . Asthma Mother     Social History  Substance Use Topics  . Smoking status: Never Smoker  . Smokeless tobacco: Never Used  . Alcohol use No    Allergies:  Allergies  Allergen Reactions  . Milk-Related Compounds Other (See Comments)    Reaction:  Unknown   . Penicillins Other (See Comments)    Reaction:  Unknown  Has patient had a PCN reaction causing immediate rash, facial/tongue/throat swelling, SOB or lightheadedness with hypotension: Unknown Has patient had a PCN reaction causing severe rash involving mucus membranes or skin necrosis: Unknown Has patient had a PCN reaction that required  hospitalization: Unknown Has patient had a PCN reaction occurring within the last 10 years: No If all of the above answers are "NO", then may proceed with Cephalosporin use.    Prescriptions Prior to Admission  Medication Sig Dispense Refill Last Dose  . albuterol (PROVENTIL HFA;VENTOLIN HFA) 108 (90 Base) MCG/ACT inhaler Inhale 2 puffs into the lungs every 6 (six) hours as needed for wheezing or shortness of breath.   Past Month at Unknown time  . albuterol (PROVENTIL) (2.5 MG/3ML) 0.083% nebulizer solution Take 3 mLs (2.5 mg total) by nebulization every 6 (six) hours as needed for wheezing or shortness of breath. 75 mL 0 Past Week at Unknown time  . cyclobenzaprine (FLEXERIL) 10 MG tablet Take 1 tablet (10 mg total) by mouth every 8 (eight) hours as needed for muscle spasms. (Patient not taking: Reported on 06/03/2017) 20 tablet 0 Not Taking at Unknown time  . metoCLOPramide (REGLAN) 10 MG tablet Take 1 tablet (10 mg total) by mouth every 6 (six) hours as needed for nausea (nausea/headache). 30 tablet 2 Past Week at Unknown time  . metroNIDAZOLE (FLAGYL) 500 MG tablet Take 1 tablet (500 mg total) by mouth 2 (two) times daily. 14 tablet 0   . Prenatal Vit-Fe Fumarate-FA (PRENATAL MULTIVITAMIN) TABS tablet Take 1 tablet by mouth daily at 12 noon.   06/02/2017 at Unknown time    Review of Systems  Constitutional: Negative for chills  and fever.  Gastrointestinal: Positive for abdominal pain, nausea and vomiting. Negative for diarrhea.  Genitourinary: Negative for dysuria, frequency, vaginal bleeding and vaginal discharge.   Physical Exam   Last menstrual period 10/28/2016.  Physical Exam  Nursing note and vitals reviewed. Constitutional: She is oriented to person, place, and time. She appears well-developed and well-nourished. She appears distressed (Patient is writhing in pain. She states, "please take this baby out of me").  HENT:  Head: Normocephalic.  Cardiovascular: Normal rate.    Respiratory: Effort normal.  GI: Soft. There is no tenderness. There is no rebound.  Genitourinary:  Genitourinary Comments: Cervix: closed/thick   Neurological: She is alert and oriented to person, place, and time.  Skin: Skin is warm.  Psychiatric: She has a normal mood and affect.   Results for orders placed or performed during the hospital encounter of 06/04/17 (from the past 24 hour(s))  CBC     Status: Abnormal   Collection Time: 06/04/17  9:06 AM  Result Value Ref Range   WBC 9.2 4.0 - 10.5 K/uL   RBC 3.42 (L) 3.87 - 5.11 MIL/uL   Hemoglobin 10.3 (L) 12.0 - 15.0 g/dL   HCT 16.1 (L) 09.6 - 04.5 %   MCV 88.3 78.0 - 100.0 fL   MCH 30.1 26.0 - 34.0 pg   MCHC 34.1 30.0 - 36.0 g/dL   RDW 40.9 81.1 - 91.4 %   Platelets 173 150 - 400 K/uL  Comprehensive metabolic panel     Status: Abnormal   Collection Time: 06/04/17  9:06 AM  Result Value Ref Range   Sodium 137 135 - 145 mmol/L   Potassium 3.2 (L) 3.5 - 5.1 mmol/L   Chloride 102 101 - 111 mmol/L   CO2 22 22 - 32 mmol/L   Glucose, Bld 71 65 - 99 mg/dL   BUN 7 6 - 20 mg/dL   Creatinine, Ser 7.82 0.44 - 1.00 mg/dL   Calcium 9.0 8.9 - 95.6 mg/dL   Total Protein 7.1 6.5 - 8.1 g/dL   Albumin 3.1 (L) 3.5 - 5.0 g/dL   AST 13 (L) 15 - 41 U/L   ALT 7 (L) 14 - 54 U/L   Alkaline Phosphatase 107 38 - 126 U/L   Total Bilirubin 1.0 0.3 - 1.2 mg/dL   GFR calc non Af Amer >60 >60 mL/min   GFR calc Af Amer >60 >60 mL/min   Anion gap 13 5 - 15  Amylase     Status: Abnormal   Collection Time: 06/04/17  9:06 AM  Result Value Ref Range   Amylase 122 (H) 28 - 100 U/L  Lipase, blood     Status: None   Collection Time: 06/04/17  9:06 AM  Result Value Ref Range   Lipase 23 11 - 51 U/L   US Abdomen Complete  Result Date: 06/04/2017 CLINICAL DATA:  Upper abdominal pain. Thirty-one weeks pregnant. Elevated amylase. EXAM: ABDOMEN ULTRASOUND COMPLETE COMPARISON:  Right upper quadrant abdominal ultrasound dated 05/17/2017. FINDINGS:  Gallbladder: No gallstones or wall thickening visualized. No sonographic Murphy sign noted by sonographer. Common bile duct: Diameter: 3.5 mm Liver: No focal lesion identified. Within normal limits in parenchymal echogenicity. IVC: No abnormality visualized. Pancreas: Visualized portion unremarkable. Spleen: Size and appearance within normal limits. Right Kidney: Length: 9.7 cm. Echogenicity within normal limits. No mass or hydronephrosis visualized. Left Kidney: Length: 10.0 cm. Echogenicity within normal limits. No mass or hydronephrosis visualized. Abdominal aorta: No aneurysm visualized. Other findings: None. IMPRESSION: Normal  examination. Electronically Signed   By: Steven  Reid M.D.   On: 06/04/2017 10:59    FHT: 120, moderate with 10x10 accels, no decels (apprBeckie Saltsopriate for GA) Toco: no UCs  MAU Course  Procedures  MDM Patient has had 1L LR bolus, zofran for nausea and dilaudid for pain. She is resting comfortably.  CSW has seen the patient to address any needs at this time. She rpeorts that patient denies any specific needs today. They will put patient on their watch list, and assist at time of delivery if needed or if needed prior to that.   1142: Cervical exam unchanged. Will give 40meq of K prior to DC home. Patient also requesting something more for pain prior to DC home. Will give on PO dose of vicodin prior to DC home. DW patient that labs and US findings are normal today (except for K), and that pain is likely a common discomfort of pregnancy.    Assessment and Plan   1. Abdominal pain in pregnancy, third trimester   2. [redacted] weeks gestation of pregnancy   3. Hypokalemia    DC home Comfort measures reviewed  3rd Trimester precautions  PTL precautions  Fetal kick counts RX: k-dur 40meq QD x 3 days, phenergan PRN #[id number]  Return to MAU as needed FU with OB as planned  Follow-up Information    [facility]   Specialty:  Obstetrics and  Gynecology Why:  call for an appointment  Contact information: [address] [place] [id number] [phone]           Thressa ShellerHeather Nemesis Rainwater 06/04/2017, 8:23 AM

## 2017-06-04 NOTE — MAU Provider Note (Signed)
History     CSN: [id number]  Arrival date and time: 06/04/17 0040   First Provider Initiated Contact with Patient 06/04/17 0051      Chief Complaint  Patient presents with  . Abdominal Pain  . Contractions   HPI  [id number] @31 .2 wks arrived via EMS for ctx. She says her pain is constant but it gets worse periodically. Sx started around 0400 on 08/11. Ctx are q5-10 min. Denies VB, vaginal discharge. Reports good FM. She was receiving [facility] in [place] and last appt was in May. She was in MAU earlier today for similar symptoms and given flagyl and flexeril. Patient reports taking them, along with tylenol but has no relief of pain. She is writhing in the bed non-stop. Patient says she wants this baby out of her.  OB History    Gravida Para Term Preterm AB Living   4 2 2  0 1 2   SAB TAB Ectopic Multiple Live Births   0 0 0 0 2      Past Medical History:  Diagnosis Date  . Anemia   . Asthma   . Hypokalemia     Past Surgical History:  Procedure Laterality Date  . WISDOM TOOTH EXTRACTION      Family History  Problem Relation Age of Onset  . Cancer Mother   . Asthma Mother     Social History  Substance Use Topics  . Smoking status: Never Smoker  . Smokeless tobacco: Never Used  . Alcohol use No    Allergies:  Allergies  Allergen Reactions  . Milk-Related Compounds Other (See Comments)    Reaction:  Unknown   . Penicillins Other (See Comments)    Reaction:  Unknown  Has patient had a PCN reaction causing immediate rash, facial/tongue/throat swelling, SOB or lightheadedness with hypotension: Unknown Has patient had a PCN reaction causing severe rash involving mucus membranes or skin necrosis: Unknown Has patient had a PCN reaction that required hospitalization: Unknown Has patient had a PCN reaction occurring within the last 10 years: No If all of the above answers are "NO", then may proceed with Cephalosporin use.    Prescriptions Prior to Admission  Medication Sig  Dispense Refill Last Dose  . albuterol (PROVENTIL HFA;VENTOLIN HFA) 108 (90 Base) MCG/ACT inhaler Inhale 2 puffs into the lungs every 6 (six) hours as needed for wheezing or shortness of breath.   Past Month at Unknown time  . albuterol (PROVENTIL) (2.5 MG/3ML) 0.083% nebulizer solution Take 3 mLs (2.5 mg total) by nebulization every 6 (six) hours as needed for wheezing or shortness of breath. 75 mL 0 Past Week at Unknown time  . cyclobenzaprine (FLEXERIL) 10 MG tablet Take 1 tablet (10 mg total) by mouth every 8 (eight) hours as needed for muscle spasms. (Patient not taking: Reported on 06/03/2017) 20 tablet 0 Not Taking at Unknown time  . metoCLOPramide (REGLAN) 10 MG tablet Take 1 tablet (10 mg total) by mouth every 6 (six) hours as needed for nausea (nausea/headache). 30 tablet 2 Past Week at Unknown time  . metroNIDAZOLE (FLAGYL) 500 MG tablet Take 1 tablet (500 mg total) by mouth 2 (two) times daily. 14 tablet 0   . Prenatal Vit-Fe Fumarate-FA (PRENATAL MULTIVITAMIN) TABS tablet Take 1 tablet by mouth daily at 12 noon.   06/02/2017 at Unknown time    Review of Systems  All systems reviewed and are negative for acute change except as noted in the HPI.  Physical Exam   Blood pressure 110/65,  pulse 68, temperature 97.8 F (36.6 C), resp. rate 18, last menstrual period 10/28/2016.  Physical Exam  Constitutional: She appears well-developed and well-nourished. She appears distressed.  HENT:  Head: Normocephalic and atraumatic.  Neck: Neck supple.  Cardiovascular: Normal rate.   Respiratory: Effort normal.  Skin: Skin is warm and dry.  Psychiatric: Her affect is inappropriate. Her speech is slurred. She is agitated.  Abdomen: soft, gravid, non-tender to palpation outside of contractions Dilation: Closed Effacement (%): Thick Cervical Position: Posterior Station: Ballotable Presentation: Undeterminable Exam by:: benji stanley RN   Results for orders placed or performed during the  hospital encounter of 06/04/17  Urine rapid drug screen (hosp performed)  Result Value Ref Range   Opiates NONE DETECTED NONE DETECTED   Cocaine NONE DETECTED NONE DETECTED   Benzodiazepines NONE DETECTED NONE DETECTED   Amphetamines NONE DETECTED NONE DETECTED   Tetrahydrocannabinol POSITIVE (A) NONE DETECTED   Barbiturates NONE DETECTED NONE DETECTED  Urinalysis, Routine w reflex microscopic  Result Value Ref Range   Color, Urine YELLOW YELLOW   APPearance CLEAR CLEAR   Specific Gravity, Urine 1.025 1.005 - 1.030   pH 6.0 5.0 - 8.0   Glucose, UA NEGATIVE NEGATIVE mg/dL   Hgb urine dipstick NEGATIVE NEGATIVE   Bilirubin Urine NEGATIVE NEGATIVE   Ketones, ur 80 (A) NEGATIVE mg/dL   Protein, ur 30 (A) NEGATIVE mg/dL   Nitrite NEGATIVE NEGATIVE   Leukocytes, UA NEGATIVE NEGATIVE   RBC / HPF 0-5 0 - 5 RBC/hpf   WBC, UA 0-5 0 - 5 WBC/hpf   Bacteria, UA NONE SEEN NONE SEEN   Squamous Epithelial / LPF 0-5 (A) NONE SEEN   Mucous PRESENT   Lipase, blood  Result Value Ref Range   Lipase 23 11 - 51 U/L  Comprehensive metabolic panel  Result Value Ref Range   Sodium 135 135 - 145 mmol/L   Potassium 3.5 3.5 - 5.1 mmol/L   Chloride 102 101 - 111 mmol/L   CO2 20 (L) 22 - 32 mmol/L   Glucose, Bld 80 65 - 99 mg/dL   BUN 7 6 - 20 mg/dL   Creatinine, Ser 6.570.48 0.44 - 1.00 mg/dL   Calcium 9.1 8.9 - 84.610.3 mg/dL   Total Protein 6.7 6.5 - 8.1 g/dL   Albumin 3.0 (L) 3.5 - 5.0 g/dL   AST 14 (L) 15 - 41 U/L   ALT 7 (L) 14 - 54 U/L   Alkaline Phosphatase 114 38 - 126 U/L   Total Bilirubin 1.0 0.3 - 1.2 mg/dL   GFR calc non Af Amer >60 >60 mL/min   GFR calc Af Amer >60 >60 mL/min   Anion gap 13 5 - 15  CBC  Result Value Ref Range   WBC 10.7 (H) 4.0 - 10.5 K/uL   RBC 3.52 (L) 3.87 - 5.11 MIL/uL   Hemoglobin 10.5 (L) 12.0 - 15.0 g/dL   HCT 96.230.8 (L) 95.236.0 - 84.146.0 %   MCV 87.5 78.0 - 100.0 fL   MCH 29.8 26.0 - 34.0 pg   MCHC 34.1 30.0 - 36.0 g/dL   RDW 32.413.5 40.111.5 - 02.715.5 %   Platelets 201 150 -  400 K/uL     MAU Course  Procedures  MDM: Patient seen earlier 08/11, being treated for BV. UA, UDS - No signs of UTI - Ketones and protein noted  , UDS +THC CBC, CMP, Lipase - unremarkable LR bolus with phenergan Cervical exam preformed x2 unchanged FHT reassuring; toco with occasional  irregular contractions and some irritability -> rechecked after fluid bolus and improved  Assessment and Plan  Limited prenatal care in third trimester  Abdominal pain during pregnancy, third trimester Upper abdominal pain of unknown etiology  Braxton-Hicks  P: No evidence of preterm labor or other pathology today Treated for nausea, dehydration, and musculoskeletal pain Discharge home Pt to return to MAU as needed for emergencies Pt was here on 07/25 for similar complaints and findings. Has not made a follow up appointment for pre-natal care, but says she has the number and will   [place] Shirley 06/04/2017, 1:15 AM   OB FELLOW MAU DISCHARGE ATTESTATION  I have seen and examined this patient. I agree with above documentation in resident's note and have made edits as needed.    Caryl Ada, DO OB Fellow

## 2017-06-04 NOTE — Discharge Instructions (Signed)

## 2017-06-04 NOTE — Discharge Instructions (Signed)

## 2017-06-05 ENCOUNTER — Encounter (HOSPITAL_COMMUNITY): Payer: Self-pay

## 2017-06-05 ENCOUNTER — Inpatient Hospital Stay (HOSPITAL_COMMUNITY)
Admission: AD | Admit: 2017-06-05 | Discharge: 2017-06-05 | Disposition: A | Discharge status: Home / Self Care | Payer: Medicaid Other | Source: Ambulatory Visit | Attending: Obstetrics & Gynecology | Admitting: Obstetrics & Gynecology

## 2017-06-05 DIAGNOSIS — R109 Unspecified abdominal pain: Secondary | ICD-10-CM | POA: Diagnosis not present

## 2017-06-05 DIAGNOSIS — O26893 Other specified pregnancy related conditions, third trimester: Secondary | ICD-10-CM

## 2017-06-05 DIAGNOSIS — Z91011 Allergy to milk products: Secondary | ICD-10-CM | POA: Insufficient documentation

## 2017-06-05 DIAGNOSIS — O26899 Other specified pregnancy related conditions, unspecified trimester: Secondary | ICD-10-CM

## 2017-06-05 DIAGNOSIS — Z79899 Other long term (current) drug therapy: Secondary | ICD-10-CM | POA: Diagnosis not present

## 2017-06-05 DIAGNOSIS — J45909 Unspecified asthma, uncomplicated: Secondary | ICD-10-CM | POA: Diagnosis not present

## 2017-06-05 DIAGNOSIS — Z809 Family history of malignant neoplasm, unspecified: Secondary | ICD-10-CM | POA: Insufficient documentation

## 2017-06-05 DIAGNOSIS — O219 Vomiting of pregnancy, unspecified: Secondary | ICD-10-CM

## 2017-06-05 DIAGNOSIS — E876 Hypokalemia: Secondary | ICD-10-CM | POA: Insufficient documentation

## 2017-06-05 DIAGNOSIS — Z3A31 31 weeks gestation of pregnancy: Secondary | ICD-10-CM | POA: Diagnosis not present

## 2017-06-05 DIAGNOSIS — O99513 Diseases of the respiratory system complicating pregnancy, third trimester: Secondary | ICD-10-CM | POA: Insufficient documentation

## 2017-06-05 DIAGNOSIS — R112 Nausea with vomiting, unspecified: Secondary | ICD-10-CM | POA: Insufficient documentation

## 2017-06-05 DIAGNOSIS — Z880 Allergy status to penicillin: Secondary | ICD-10-CM | POA: Diagnosis not present

## 2017-06-05 DIAGNOSIS — Z825 Family history of asthma and other chronic lower respiratory diseases: Secondary | ICD-10-CM | POA: Insufficient documentation

## 2017-06-05 DIAGNOSIS — O99283 Endocrine, nutritional and metabolic diseases complicating pregnancy, third trimester: Secondary | ICD-10-CM | POA: Insufficient documentation

## 2017-06-05 LAB — URINALYSIS, ROUTINE W REFLEX MICROSCOPIC
BILIRUBIN URINE: NEGATIVE
GLUCOSE, UA: NEGATIVE mg/dL
Hgb urine dipstick: NEGATIVE
Ketones, ur: 80 mg/dL — AB
LEUKOCYTES UA: NEGATIVE
NITRITE: NEGATIVE
PH: 6 (ref 5.0–8.0)
Protein, ur: NEGATIVE mg/dL
Specific Gravity, Urine: 1.024 (ref 1.005–1.030)

## 2017-06-05 LAB — BASIC METABOLIC PANEL
Anion gap: 10 (ref 5–15)
BUN: 5 mg/dL — AB (ref 6–20)
CHLORIDE: 100 mmol/L — AB (ref 101–111)
CO2: 23 mmol/L (ref 22–32)
Calcium: 8.9 mg/dL (ref 8.9–10.3)
Creatinine, Ser: 0.51 mg/dL (ref 0.44–1.00)
GFR calc Af Amer: >60 mL/min (ref >60)
GFR calc non Af Amer: >60 mL/min (ref >60)
GLUCOSE: 91 mg/dL (ref 65–99)
POTASSIUM: 2.9 mmol/L — AB (ref 3.5–5.1)
Sodium: 133 mmol/L — ABNORMAL LOW (ref 135–145)

## 2017-06-05 LAB — CBC
HCT: 28.4 % — ABNORMAL LOW (ref 36.0–46.0)
HEMOGLOBIN: 9.9 g/dL — AB (ref 12.0–15.0)
MCH: 30.7 pg (ref 26.0–34.0)
MCHC: 34.9 g/dL (ref 30.0–36.0)
MCV: 88.2 fL (ref 78.0–100.0)
Platelets: 184 10*3/uL (ref 150–400)
RBC: 3.22 MIL/uL — AB (ref 3.87–5.11)
RDW: 13.4 % (ref 11.5–15.5)
WBC: 8.3 10*3/uL (ref 4.0–10.5)

## 2017-06-05 LAB — LIPASE, BLOOD: Lipase: 25 U/L (ref 11–51)

## 2017-06-05 MED ORDER — PROMETHAZINE HCL 25 MG/ML IJ SOLN
25.0000 mg | Freq: Once | INTRAMUSCULAR | Status: AC
  Administered 2017-06-05: 25 mg via INTRAVENOUS
  Filled 2017-06-05: qty 1

## 2017-06-05 MED ORDER — M.V.I. ADULT IV INJ
Freq: Once | INTRAVENOUS | Status: AC
  Administered 2017-06-05: 15:00:00 via INTRAVENOUS
  Filled 2017-06-05: qty 10

## 2017-06-05 MED ORDER — LACTATED RINGERS IV BOLUS (SEPSIS)
1000.0000 mL | Freq: Once | INTRAVENOUS | Status: AC
  Administered 2017-06-05: 1000 mL via INTRAVENOUS

## 2017-06-05 MED ORDER — HYDROMORPHONE HCL 1 MG/ML IJ SOLN
1.0000 mg | Freq: Once | INTRAMUSCULAR | Status: AC
  Administered 2017-06-05: 1 mg via INTRAVENOUS
  Filled 2017-06-05: qty 1

## 2017-06-05 MED ORDER — PANTOPRAZOLE SODIUM 40 MG IV SOLR
40.0000 mg | Freq: Once | INTRAVENOUS | Status: AC
  Administered 2017-06-05: 40 mg via INTRAVENOUS
  Filled 2017-06-05: qty 40

## 2017-06-05 NOTE — MAU Provider Note (Signed)
History     CSN: [id number]  Arrival date and time: 06/05/17 1215  First Provider Initiated Contact with Patient 06/05/17 1318      Chief Complaint  Patient presents with  . Abdominal Pain   HPI Mercedes [name] is a 26 y.o. [id number] at [redacted]w[redacted]d who presents via EMS for abdominal pain. This is her 4th MAU visit since Friday with same complaint; also seen in MAU & [facility] previously with similar complaints. Has had no prenatal care since [redacted] wks gestation.  Reports intermittent sharp pains in her upper abdomen as well as lower abdominal pain that radiates to her low back. This has been an ongoing issue for 2+ weeks. Rates pain 10/10. Was previously prescribed protonix but stopped taking that. Taking reglan for n/v; last took dose this morning. Reports vomiting twice today. Denies fever/chills, diarrhea, constipation, vaginal bleeding, or LOF. Positive fetal movement.   OB History    Gravida Para Term Preterm AB Living   4 2 2  0 1 2   SAB TAB Ectopic Multiple Live Births   0 0 0 0 2      Past Medical History:  Diagnosis Date  . Anemia   . Asthma   . Hypokalemia     Past Surgical History:  Procedure Laterality Date  . WISDOM TOOTH EXTRACTION      Family History  Problem Relation Age of Onset  . Cancer Mother   . Asthma Mother     Social History  Substance Use Topics  . Smoking status: Never Smoker  . Smokeless tobacco: Never Used  . Alcohol use No    Allergies:  Allergies  Allergen Reactions  . Milk-Related Compounds Other (See Comments)    Reaction:  Unknown   . Penicillins Other (See Comments)    Reaction:  Unknown  Has patient had a PCN reaction causing immediate rash, facial/tongue/throat swelling, SOB or lightheadedness with hypotension: Unknown Has patient had a PCN reaction causing severe rash involving mucus membranes or skin necrosis: Unknown Has patient had a PCN reaction that required hospitalization: Unknown Has patient had a PCN reaction  occurring within the last 10 years: No If all of the above answers are "NO", then may proceed with Cephalosporin use.    Prescriptions Prior to Admission  Medication Sig Dispense Refill Last Dose  . albuterol (PROVENTIL HFA;VENTOLIN HFA) 108 (90 Base) MCG/ACT inhaler Inhale 2 puffs into the lungs every 6 (six) hours as needed for wheezing or shortness of breath.   Past Month at Unknown time  . albuterol (PROVENTIL) (2.5 MG/3ML) 0.083% nebulizer solution Take 3 mLs (2.5 mg total) by nebulization every 6 (six) hours as needed for wheezing or shortness of breath. 75 mL 0 Past Week at Unknown time  . cyclobenzaprine (FLEXERIL) 10 MG tablet Take 1 tablet (10 mg total) by mouth every 8 (eight) hours as needed for muscle spasms. (Patient not taking: Reported on 06/03/2017) 20 tablet 0 Not Taking at Unknown time  . metoCLOPramide (REGLAN) 10 MG tablet Take 1 tablet (10 mg total) by mouth every 6 (six) hours as needed for nausea (nausea/headache). 30 tablet 2 Past Week at Unknown time  . metroNIDAZOLE (FLAGYL) 500 MG tablet Take 1 tablet (500 mg total) by mouth 2 (two) times daily. 14 tablet 0   . potassium chloride SA (K-DUR,KLOR-CON) 20 MEQ tablet Take 2 tablets (40 mEq total) by mouth daily. 6 tablet 0   . Prenatal Vit-Fe Fumarate-FA (PRENATAL MULTIVITAMIN) TABS tablet Take 1 tablet by mouth  daily at 12 noon.   06/02/2017 at Unknown time  . promethazine (PHENERGAN) 25 MG tablet Take 0.5-1 tablets (12.5-25 mg total) by mouth every 6 (six) hours as needed. 30 tablet 0     Review of Systems  Constitutional: Negative for chills and fever.  Gastrointestinal: Positive for abdominal pain, nausea and vomiting. Negative for constipation and diarrhea.  Genitourinary: Negative for vaginal bleeding and vaginal discharge.  Musculoskeletal: Positive for back pain.   Physical Exam   Blood pressure 121/60, pulse 75, temperature 98.2 F (36.8 C), resp. rate 16, last menstrual period 10/28/2016, SpO2 100  %.  Physical Exam  Nursing note and vitals reviewed. Constitutional: She is oriented to person, place, and time. She appears well-developed and well-nourished. No distress.  HENT:  Head: Normocephalic and atraumatic.  Eyes: Conjunctivae are normal. Right eye exhibits no discharge. Left eye exhibits no discharge. No scleral icterus.  Neck: Normal range of motion.  Cardiovascular: Normal rate, regular rhythm and normal heart sounds.   No murmur heard. Respiratory: Effort normal and breath sounds normal. No respiratory distress. She has no wheezes.  GI: Soft. Bowel sounds are normal. There is no tenderness.  Neurological: She is alert and oriented to person, place, and time.  Skin: Skin is warm and dry. She is not diaphoretic.  Psychiatric: She has a normal mood and affect. Her behavior is normal. Judgment and thought content normal.   Dilation: Closed Exam by:: Tehya Leath,NP  Fetal Tracing:  Baseline: 130 Variability: moderate Accelerations: 10x10 Decelerations: none  Toco: none MAU Course  Procedures Results for orders placed or performed during the hospital encounter of 06/05/17 (from the past 24 hour(s))  Urinalysis, Routine w reflex microscopic     Status: Abnormal   Collection Time: 06/05/17  1:28 PM  Result Value Ref Range   Color, Urine YELLOW YELLOW   APPearance CLEAR CLEAR   Specific Gravity, Urine 1.024 1.005 - 1.030   pH 6.0 5.0 - 8.0   Glucose, UA NEGATIVE NEGATIVE mg/dL   Hgb urine dipstick NEGATIVE NEGATIVE   Bilirubin Urine NEGATIVE NEGATIVE   Ketones, ur 80 (A) NEGATIVE mg/dL   Protein, ur NEGATIVE NEGATIVE mg/dL   Nitrite NEGATIVE NEGATIVE   Leukocytes, UA NEGATIVE NEGATIVE  CBC     Status: Abnormal   Collection Time: 06/05/17  4:51 PM  Result Value Ref Range   WBC 8.3 4.0 - 10.5 K/uL   RBC 3.22 (L) 3.87 - 5.11 MIL/uL   Hemoglobin 9.9 (L) 12.0 - 15.0 g/dL   HCT 96.0 (L) 45.4 - 09.8 %   MCV 88.2 78.0 - 100.0 fL   MCH 30.7 26.0 - 34.0 pg   MCHC 34.9  30.0 - 36.0 g/dL   RDW 11.9 14.7 - 82.9 %   Platelets 184 150 - 400 K/uL  Basic metabolic panel     Status: Abnormal   Collection Time: 06/05/17  4:51 PM  Result Value Ref Range   Sodium 133 (L) 135 - 145 mmol/L   Potassium 2.9 (L) 3.5 - 5.1 mmol/L   Chloride 100 (L) 101 - 111 mmol/L   CO2 23 22 - 32 mmol/L   Glucose, Bld 91 65 - 99 mg/dL   BUN 5 (L) 6 - 20 mg/dL   Creatinine, Ser 5.62 0.44 - 1.00 mg/dL   Calcium 8.9 8.9 - 13.0 mg/dL   GFR calc non Af Amer >60 >60 mL/min   GFR calc Af Amer >60 >60 mL/min   Anion gap 10 5 - 15  Lipase, blood     Status: None   Collection Time: 06/05/17  4:51 PM  Result Value Ref Range   Lipase 25 11 - 51 U/L    MDM Reactive NST No ctx on TOCO or palpated. Cervix closed/thick/high IV fluids x 2 bags (LR & MVI in D5LR). Phenergan 25 mg & protonix 40 mg IV. Patient reports continued pain. Dilaudid 1 mg IV given. Patient reports improvement in symptoms & lying in room sleeping.  CBC, BMP, & lipase ordered. Potassium remains low. Pt taking Kdur at home. Labs drawn after 2 bags of IV fluids given.   Assessment and Plan  A: 1. Abdominal pain affecting pregnancy   2. Nausea/vomiting in pregnancy    P: Discharge home Continue meds as prescribed Schedule prenatal care ASAP Discussed reasons to return to MAU  Judeth HornErin Natali Lavallee 06/05/2017, 1:15 PM

## 2017-06-05 NOTE — MAU Note (Signed)
Pt. Came in EMS from home complains of upper abdominal pain that started 4 days and is rating them 10 out of 10. She took flexeril at 10 am and the pain is intermittent.

## 2017-06-05 NOTE — Discharge Instructions (Signed)
Eating Plan for Hyperemesis Gravidarum Hyperemesis gravidarum is a severe form of morning sickness. Because this condition causes severe nausea and vomiting, it can lead to dehydration, malnutrition, and weight loss. One way to lessen the symptoms of nausea and vomiting is to follow the eating plan for hyperemesis gravidarum. It is often used along with prescribed medicines to control your symptoms. What can I do to relieve my symptoms? Listen to your body. Everyone is different and has different preferences. Find what works best for you. Take any of the following actions that are helpful to you:  Eat and drink slowly.  Eat 5-6 small meals daily instead of 3 large meals.  Eat crackers before you get out of bed in the morning.  Try having a snack in the middle of the night.  Starchy foods are usually tolerated well. Examples include cereal, toast, bread, potatoes, pasta, rice, and pretzels.  Ginger may help with nausea. Add  tsp ground ginger to hot tea or choose ginger tea.  Try drinking 100% fruit juice or an electrolyte drink. An electrolyte drink contains sodium, potassium, and chloride.  Continue to take your prenatal vitamins as told by your health care provider. If you are having trouble taking your prenatal vitamins, talk with your health care provider about different options.  Include at least 1 serving of protein with your meals and snacks. Protein options include meats or poultry, beans, nuts, eggs, and yogurt. Try eating a protein-rich snack before bed. Examples of these snacks include cheese and crackers or half of a peanut butter or [place] sandwich.  Consider eliminating foods that trigger your symptoms. These may include spicy foods, coffee, high-fat foods, very sweet foods, and acidic foods.  Try meals that have more protein combined with bland, salty, lower-fat, and dry foods, such as nuts, seeds, pretzels, crackers, and cereal.  Talk with your healthcare provider  about starting a supplement of vitamin B6.  Have fluids that are cold, clear, and carbonated or sour. Examples include lemonade, ginger ale, lemon-lime soda, ice water, and sparkling water.  Try lemon or mint tea.  Try brushing your teeth or using a mouth rinse after meals.  What should I avoid to reduce my symptoms? Avoiding some of the following things may help reduce your symptoms.  Foods with strong smells. Try eating meals in well-ventilated areas that are free of odors.  Drinking water or other beverages with meals. Try not to drink anything during the 30 minutes before and after your meals.  Drinking more than 1 cup of fluid at a time. Sometimes using a straw helps.  Fried or high-fat foods, such as butter and cream sauces.  Spicy foods.  Skipping meals as best as you can. Nausea can be more intense on an empty stomach. If you cannot tolerate food at that time, do not force it. Try sucking on ice chips or other frozen items, and make up for missed calories later.  Lying down within 2 hours after eating.  Environmental triggers. These may include smoky rooms, closed spaces, rooms with strong smells, warm or humid places, overly loud and noisy rooms, and rooms with motion or flickering lights.  Quick and sudden changes in your movement.  This information is not intended to replace advice given to you by your health care provider. Make sure you discuss any questions you have with your health care provider. Document Released: 08/07/2007 Document Revised: 06/08/2016 Document Reviewed: 05/10/2016 Elsevier Interactive Patient Education  [employer]. Abdominal  Pain During Pregnancy Abdominal pain is common in pregnancy. Most of the time, it does not cause harm. There are many causes of abdominal pain. Some causes are more serious than others and sometimes the cause is not known. Abdominal pain can be a sign that something is very wrong with the pregnancy or the pain may have  nothing to do with the pregnancy. Always tell your health care provider if you have any abdominal pain. Follow these instructions at home:  Do not have sex or put anything in your vagina until your symptoms go away completely.  Watch your abdominal pain for any changes.  Get plenty of rest until your pain improves.  Drink enough fluid to keep your urine clear or pale yellow.  Take over-the-counter or prescription medicines only as told by your health care provider.  Keep all follow-up visits as told by your health care provider. This is important. Contact a health care provider if:  You have a fever.  Your pain gets worse or you have cramping.  Your pain continues after resting. Get help right away if:  You are bleeding, leaking fluid, or passing tissue from the vagina.  You have vomiting or diarrhea that does not go away.  You have painful or bloody urination.  You notice a decrease in your baby's movements.  You feel very weak or faint.  You have shortness of breath.  You develop a severe headache with abdominal pain.  You have abnormal vaginal discharge with abdominal pain. This information is not intended to replace advice given to you by your health care provider. Make sure you discuss any questions you have with your health care provider. Document Released: 10/10/2005 Document Revised: 07/21/2016 Document Reviewed: 05/09/2013 Elsevier Interactive Patient Education  [employer]2018 [employer]

## 2017-06-06 ENCOUNTER — Encounter (HOSPITAL_COMMUNITY): Payer: Self-pay

## 2017-06-06 ENCOUNTER — Inpatient Hospital Stay (HOSPITAL_COMMUNITY)
Admission: AD | Admit: 2017-06-06 | Discharge: 2017-06-06 | Disposition: A | Discharge status: Home / Self Care | Payer: Medicaid Other | Source: Ambulatory Visit | Attending: Obstetrics and Gynecology | Admitting: Obstetrics and Gynecology

## 2017-06-06 DIAGNOSIS — O26899 Other specified pregnancy related conditions, unspecified trimester: Secondary | ICD-10-CM

## 2017-06-06 DIAGNOSIS — O4703 False labor before 37 completed weeks of gestation, third trimester: Secondary | ICD-10-CM | POA: Diagnosis not present

## 2017-06-06 DIAGNOSIS — Z3A31 31 weeks gestation of pregnancy: Secondary | ICD-10-CM | POA: Insufficient documentation

## 2017-06-06 DIAGNOSIS — R109 Unspecified abdominal pain: Secondary | ICD-10-CM

## 2017-06-06 DIAGNOSIS — O26893 Other specified pregnancy related conditions, third trimester: Secondary | ICD-10-CM | POA: Diagnosis not present

## 2017-06-06 DIAGNOSIS — O0933 Supervision of pregnancy with insufficient antenatal care, third trimester: Secondary | ICD-10-CM

## 2017-06-06 LAB — FETAL FIBRONECTIN: FETAL FIBRONECTIN: NEGATIVE

## 2017-06-06 NOTE — Discharge Instructions (Signed)

## 2017-06-06 NOTE — MAU Note (Signed)
Pt here with c/o abdominal pain; denies any leaking or bleeding. Reports good fetal movement.

## 2017-06-06 NOTE — MAU Provider Note (Signed)
CC:  Chief Complaint  Patient presents with  . Abdominal Pain     First Provider Initiated Contact with Patient 06/06/17 0740      HPI: Mercedes [name] is a 26 y.o. year old [id number] female at 3569w4d weeks gestation who presents to MAU reporting irreg contractions and constant pelvic pain through the night. Has been seen in MAU numerous times for abd pain, mostly mid abd pain, but she denies that pain now.   Associated Sx:  Vaginal bleeding: Scant spotting after last cervical exam. Leaking of fluid: Denies Fetal movement: Nml Vaginal discharge: Denies Urinary complaints: Denies.   O: Patient Vitals for the past 24 hrs:  BP Temp Temp src Pulse Resp SpO2  06/06/17 0635 120/70 98.2 F (36.8 C) Oral 70 16 100 %    General: NAD Heart: Regular rate Lungs: Normal rate and effort Abd: Soft, NT, Gravid, S=D Pelvic: NEFG, Neg pooling, Neg blood.  Dilation: Closed Effacement (%): Thick Cervical Position: Middle Station: Ballotable Presentation: Undeterminable Exam by:: Dorathy KinsmanVirginia Ernst Cumpston, CNM  EFM: 135, Moderate variability, 15 x 15 accelerations, no decelerations Toco: Rare, mild  fFN neg  A: 8769w4d week IUP Braxton Hicks FHR reactive Limited PNC  P: Discharge home in stable condition. Preterm labor precautions and fetal kick counts. Follow-up 06/08/17 at [facility] Return to maternity admissions as needed if symptoms worsen.  [place] Village[place]ia Lesly Pontarelli, CNM 10/17/2016 11:40 PM  3

## 2017-06-06 NOTE — Progress Notes (Signed)
Discharge instructions given.  Pt encouraged to stay hydrated with water & keep scheduled OB appt. Bus pass given.  Pt denies ques/concerns at this time.

## 2017-06-08 ENCOUNTER — Encounter: Payer: Self-pay | Admitting: Obstetrics and Gynecology

## 2017-06-08 ENCOUNTER — Ambulatory Visit (INDEPENDENT_AMBULATORY_CARE_PROVIDER_SITE_OTHER): Payer: Medicaid Other | Admitting: Obstetrics and Gynecology

## 2017-06-08 VITALS — BP 97/58 | HR 83 | Wt 184.9 lb

## 2017-06-08 DIAGNOSIS — J45909 Unspecified asthma, uncomplicated: Secondary | ICD-10-CM | POA: Diagnosis not present

## 2017-06-08 DIAGNOSIS — O99519 Diseases of the respiratory system complicating pregnancy, unspecified trimester: Secondary | ICD-10-CM

## 2017-06-08 DIAGNOSIS — O0993 Supervision of high risk pregnancy, unspecified, third trimester: Secondary | ICD-10-CM

## 2017-06-08 DIAGNOSIS — O99323 Drug use complicating pregnancy, third trimester: Secondary | ICD-10-CM | POA: Diagnosis not present

## 2017-06-08 DIAGNOSIS — O219 Vomiting of pregnancy, unspecified: Secondary | ICD-10-CM

## 2017-06-08 DIAGNOSIS — Z3483 Encounter for supervision of other normal pregnancy, third trimester: Secondary | ICD-10-CM | POA: Diagnosis not present

## 2017-06-08 DIAGNOSIS — O210 Mild hyperemesis gravidarum: Secondary | ICD-10-CM | POA: Insufficient documentation

## 2017-06-08 DIAGNOSIS — O99210 Obesity complicating pregnancy, unspecified trimester: Secondary | ICD-10-CM | POA: Diagnosis not present

## 2017-06-08 DIAGNOSIS — Z8639 Personal history of other endocrine, nutritional and metabolic disease: Secondary | ICD-10-CM

## 2017-06-08 DIAGNOSIS — E669 Obesity, unspecified: Secondary | ICD-10-CM | POA: Diagnosis not present

## 2017-06-08 DIAGNOSIS — Z3480 Encounter for supervision of other normal pregnancy, unspecified trimester: Secondary | ICD-10-CM

## 2017-06-08 DIAGNOSIS — O0990 Supervision of high risk pregnancy, unspecified, unspecified trimester: Secondary | ICD-10-CM | POA: Insufficient documentation

## 2017-06-08 DIAGNOSIS — O0933 Supervision of pregnancy with insufficient antenatal care, third trimester: Secondary | ICD-10-CM | POA: Diagnosis not present

## 2017-06-08 DIAGNOSIS — O99513 Diseases of the respiratory system complicating pregnancy, third trimester: Secondary | ICD-10-CM

## 2017-06-08 LAB — POCT URINALYSIS DIP (DEVICE)
GLUCOSE, UA: NEGATIVE mg/dL
NITRITE: NEGATIVE
Protein, ur: 30 mg/dL — AB
SPECIFIC GRAVITY, URINE: 1.025 (ref 1.005–1.030)
UROBILINOGEN UA: 1 mg/dL (ref 0.0–1.0)
pH: 6.5 (ref 5.0–8.0)

## 2017-06-08 MED ORDER — DOXYLAMINE-PYRIDOXINE ER 20-20 MG PO TBCR: 1.0000 | EXTENDED_RELEASE_TABLET | Freq: Every day | ORAL | 1 refills | Status: DC

## 2017-06-08 MED ORDER — RANITIDINE HCL 150 MG PO CAPS: 150.0000 mg | ORAL_CAPSULE | Freq: Two times a day (BID) | ORAL | 2 refills | Status: DC

## 2017-06-08 MED ORDER — FLUTICASONE PROPIONATE HFA 110 MCG/ACT IN AERO: 2.0000 | INHALATION_SPRAY | Freq: Two times a day (BID) | RESPIRATORY_TRACT | 3 refills | Status: DC

## 2017-06-08 NOTE — Patient Instructions (Signed)
Start the fluticasone two puffs twice a day Start the [place] medication as directed and stop the reglan and compazine and only use them as needed.

## 2017-06-08 NOTE — Progress Notes (Signed)
New OB Note  06/08/2017   [facility]  Chief Complaint: transfer of care visit  Transfer of Care Patient: no  History of Present Illness: Ms. Mercedes PasseyBrown is a 26 y.o. [id number] @ 31/6 weeks (EDC 10/12, based on Patient's last menstrual period was 10/28/2016 (exact date).=5wk u/s).  Preg complicated by has Anemia; Hypokalemia; Asthma affecting pregnancy, antepartum; Supervision of high risk pregnancy, antepartum; Insufficient prenatal care in third trimester; Substance abuse affecting pregnancy in third trimester, antepartum; Obesity in pregnancy with antepartum complication; and Obesity (BMI 30-39.9) on her problem list.   [facility] but [facility]. No s/s of PTL or decreased FM  ROS: A 12-point review of systems was performed and negative, except as stated in the above HPI.  OBGYN History: As per HPI. OB History  Gravida Para Term Preterm AB Living  4 2 2  0 1 2  SAB TAB Ectopic Multiple Live Births  0 0 0 0 2    # Outcome Date GA Lbr Len/2nd Weight Sex Delivery Anes PTL Lv  4 Current           3 AB           2 Term           1 Term             Obstetric Comments  G1: 2014 3430gm SVD  G2: 01/2014 3195gm SVD/2nd    Any issues with any prior pregnancies: no Prior children are healthy, doing well, and without any problems or issues: yes History of pap smears: Yes. Last pap smear 2018 and results were NILM   Past Medical History: Past Medical History:  Diagnosis Date  . Anemia   . Asthma   . Hypokalemia     Past Surgical History: Past Surgical History:  Procedure Laterality Date  . WISDOM TOOTH EXTRACTION      Family History:  Family History  Problem Relation Age of Onset  . Cancer Mother   . Asthma Mother     She denies any history of mental retardation, birth defects or genetic disorders in her or the FOB's history  Social History:  Social History   Social History  .  Marital status: Single    Spouse name: N/A  . Number of children: N/A  . Years of education: N/A   Occupational History  . Not on file.   Social History Main Topics  . Smoking status: Former Games developermoker  . Smokeless tobacco: Never Used  . Alcohol use No  . Drug use: Yes    Types: Marijuana, Barbituates     Comment: may 2018 last "smoked"  . Sexual activity: Yes    Birth control/ protection: None     Comment: Patient wants tubes tied   Other Topics Concern  . Not on file   Social History Narrative  . No narrative on file     Allergy: Allergies  Allergen Reactions  . Milk-Related Compounds Other (See Comments)    Reaction:  Unknown   . Penicillins Other (See Comments)    Reaction:  Unknown  Has patient had a PCN reaction causing immediate rash, facial/tongue/throat swelling, SOB or lightheadedness with hypotension: Unknown Has patient had a PCN reaction causing severe rash involving mucus membranes or skin necrosis: Unknown Has patient had a PCN reaction that required hospitalization: Unknown Has patient had a PCN reaction occurring within the last 10 years:  No If all of the above answers are "NO", then may proceed with Cephalosporin use.    Health Maintenance:  Mammogram Up to Date: not applicable  Current Outpatient Medications: PNV, flagyl, diflucan, compazine and reglan @CMEDTAKING @  Physical Exam:   BP (!) 97/58   Pulse 83   Wt 184 lb 14.4 oz (83.9 kg)   LMP 10/28/2016 (Exact Date)   BMI 31.74 kg/m  Body mass index is 31.74 kg/m. Contractions: Irregular Vag. Bleeding: None. Fundal height: 32 FHTs: 130s  General appearance: Well nourished, well developed female in no acute distress.   Laboratory: A pos, Ab neg/Rub Imm/Var Imm/rpr neg/hiv neg/hepB surf Ag neg/pap neg 2018/ucx neg/hemoglobinopathy panel neg/ early 1hr neg/bmp neg except K 3.2  Imaging:  See epic  Assessment: pt stable  Plan: 1. Supervision of high risk pregnancy, antepartum Pt  seen in MAU on 7/25 and had negative FFN and was cl/long/high and normal limited u/s. Routine care. 28wk labs today. D/w pt re: BC nv - RPR - HIV antibody - CBC - Glucose tolerance, 1 hour  2. History of hypokalemia - Basic metabolic panel  3. Supervision of high risk pregnancy, antepartum  4. Insufficient prenatal care in third trimester  5. Substance abuse affecting pregnancy in third trimester, antepartum Repeat UDS on L&D admit  6. Obesity in pregnancy with antepartum complication  7. Obesity (BMI 30-39.9)  8. Asthma affecting pregnancy, antepartum Pt states she uses the albuterol INH qday and the nebulizer 1-2x/week. She is not on a long acting. Will start fluticasone and get serial growth u/s on her. Pt states she was on advair in the past but it didn't seem to work. 7/22: efw 39%, ac @ 18%, normal AFI and cx length - [place] MFM OB FOLLOW UP; Future  9. N/V of pregnancy Patient has tried bonjesta or diclegis. Will start this and pt told to d/c the other meds and use as PRN. Will also start zantac  Problem list reviewed and updated.  Follow up in 1-2 weeks.  >50% of 25 min visit spent on counseling and coordination of care.     Cornelia Copa MD Attending Center for [facility])

## 2017-06-09 LAB — BASIC METABOLIC PANEL
BUN/Creatinine Ratio: 17 (ref 9–23)
BUN: 9 mg/dL (ref 6–20)
CO2: 21 mmol/L (ref 20–29)
CREATININE: 0.54 mg/dL — AB (ref 0.57–1.00)
Calcium: 8.7 mg/dL (ref 8.7–10.2)
Chloride: 102 mmol/L (ref 96–106)
GFR, EST AFRICAN AMERICAN: 152 mL/min/{1.73_m2} (ref >59)
GFR, EST NON AFRICAN AMERICAN: 132 mL/min/{1.73_m2} (ref >59)
Glucose: 96 mg/dL (ref 65–99)
POTASSIUM: 3.3 mmol/L — AB (ref 3.5–5.2)
SODIUM: 137 mmol/L (ref 134–144)

## 2017-06-09 LAB — CBC
HEMATOCRIT: 27.4 % — AB (ref 34.0–46.6)
Hemoglobin: 9.5 g/dL — ABNORMAL LOW (ref 11.1–15.9)
MCH: 29.4 pg (ref 26.6–33.0)
MCHC: 34.7 g/dL (ref 31.5–35.7)
MCV: 85 fL (ref 79–97)
PLATELETS: 188 10*3/uL (ref 150–379)
RBC: 3.23 x10E6/uL — ABNORMAL LOW (ref 3.77–5.28)
RDW: 13.7 % (ref 12.3–15.4)
WBC: 9.1 10*3/uL (ref 3.4–10.8)

## 2017-06-09 LAB — HIV ANTIBODY (ROUTINE TESTING W REFLEX): HIV Screen 4th Generation wRfx: NONREACTIVE

## 2017-06-09 LAB — GLUCOSE TOLERANCE, 1 HOUR: Glucose, 1Hr PP: 110 mg/dL (ref 65–199)

## 2017-06-09 LAB — RPR: RPR Ser Ql: NONREACTIVE

## 2017-06-12 ENCOUNTER — Encounter: Payer: Self-pay | Admitting: Obstetrics and Gynecology

## 2017-06-20 ENCOUNTER — Encounter: Payer: Medicaid Other | Admitting: Certified Nurse Midwife

## 2017-06-22 ENCOUNTER — Other Ambulatory Visit: Payer: Self-pay | Admitting: Obstetrics and Gynecology

## 2017-06-22 ENCOUNTER — Ambulatory Visit (HOSPITAL_COMMUNITY)
Admission: RE | Admit: 2017-06-22 | Discharge: 2017-06-22 | Disposition: A | Discharge status: Home / Self Care | Payer: Medicaid Other | Source: Ambulatory Visit | Attending: Obstetrics and Gynecology | Admitting: Obstetrics and Gynecology

## 2017-06-22 ENCOUNTER — Encounter: Payer: Self-pay | Admitting: General Practice

## 2017-06-22 DIAGNOSIS — Z3A33 33 weeks gestation of pregnancy: Secondary | ICD-10-CM | POA: Diagnosis not present

## 2017-06-22 DIAGNOSIS — O99519 Diseases of the respiratory system complicating pregnancy, unspecified trimester: Secondary | ICD-10-CM

## 2017-06-22 DIAGNOSIS — O0993 Supervision of high risk pregnancy, unspecified, third trimester: Secondary | ICD-10-CM | POA: Insufficient documentation

## 2017-06-22 DIAGNOSIS — Z362 Encounter for other antenatal screening follow-up: Secondary | ICD-10-CM

## 2017-06-22 DIAGNOSIS — O99513 Diseases of the respiratory system complicating pregnancy, third trimester: Secondary | ICD-10-CM | POA: Diagnosis not present

## 2017-06-22 DIAGNOSIS — Z3480 Encounter for supervision of other normal pregnancy, unspecified trimester: Secondary | ICD-10-CM

## 2017-06-22 DIAGNOSIS — J45909 Unspecified asthma, uncomplicated: Secondary | ICD-10-CM

## 2017-06-22 DIAGNOSIS — O0990 Supervision of high risk pregnancy, unspecified, unspecified trimester: Secondary | ICD-10-CM

## 2017-07-03 ENCOUNTER — Inpatient Hospital Stay (HOSPITAL_COMMUNITY)
Admission: AD | Admit: 2017-07-03 | Discharge: 2017-07-03 | Disposition: A | Discharge status: Home / Self Care | Payer: Medicaid Other | Source: Ambulatory Visit | Attending: Obstetrics & Gynecology | Admitting: Obstetrics & Gynecology

## 2017-07-03 ENCOUNTER — Encounter (HOSPITAL_COMMUNITY): Payer: Self-pay | Admitting: *Deleted

## 2017-07-03 ENCOUNTER — Encounter: Payer: Medicaid Other | Admitting: Obstetrics & Gynecology

## 2017-07-03 DIAGNOSIS — O26893 Other specified pregnancy related conditions, third trimester: Secondary | ICD-10-CM | POA: Diagnosis not present

## 2017-07-03 DIAGNOSIS — Z87891 Personal history of nicotine dependence: Secondary | ICD-10-CM | POA: Diagnosis not present

## 2017-07-03 DIAGNOSIS — O4703 False labor before 37 completed weeks of gestation, third trimester: Secondary | ICD-10-CM | POA: Insufficient documentation

## 2017-07-03 DIAGNOSIS — Z3A35 35 weeks gestation of pregnancy: Secondary | ICD-10-CM | POA: Insufficient documentation

## 2017-07-03 DIAGNOSIS — Z880 Allergy status to penicillin: Secondary | ICD-10-CM | POA: Diagnosis not present

## 2017-07-03 DIAGNOSIS — N898 Other specified noninflammatory disorders of vagina: Secondary | ICD-10-CM | POA: Insufficient documentation

## 2017-07-03 DIAGNOSIS — N949 Unspecified condition associated with female genital organs and menstrual cycle: Secondary | ICD-10-CM | POA: Diagnosis not present

## 2017-07-03 DIAGNOSIS — Z3689 Encounter for other specified antenatal screening: Secondary | ICD-10-CM | POA: Diagnosis not present

## 2017-07-03 DIAGNOSIS — O479 False labor, unspecified: Secondary | ICD-10-CM

## 2017-07-03 HISTORY — DX: Calculus of kidney: N20.0

## 2017-07-03 LAB — URINALYSIS, ROUTINE W REFLEX MICROSCOPIC
Bilirubin Urine: NEGATIVE
GLUCOSE, UA: NEGATIVE mg/dL
Hgb urine dipstick: NEGATIVE
KETONES UR: NEGATIVE mg/dL
Nitrite: NEGATIVE
PH: 6 (ref 5.0–8.0)
Protein, ur: NEGATIVE mg/dL
SPECIFIC GRAVITY, URINE: 1.017 (ref 1.005–1.030)

## 2017-07-03 MED ORDER — COMFORT FIT MATERNITY SUPP LG MISC: 1.0000 "application " | Freq: Every day | 0 refills | Status: DC

## 2017-07-03 NOTE — MAU Provider Note (Signed)
History     CSN: [id number]  Arrival date and time: 07/03/17 1884   First Provider Initiated Contact with Patient 07/03/17 [phone]      Chief Complaint  Patient presents with  . Contractions   [id number] .3 weeks here with ctx and pelvic pressure. Ctx and pressure started around 0100. She is unsure of the frequency. She also reports seeing clear fluid on her sheets around 0400. No LOF since. No recent IC. No VB. Reports good FM.   OB History    Gravida Para Term Preterm AB Living   0 1 2   SAB TAB Ectopic Multiple Live Births   0 0 0 0 2      Obstetric Comments   G1: 2014 3430gm SVD G2: 01/2014 3195gm SVD/2nd      Past Medical History:  Diagnosis Date  . Anemia   . Asthma   . Hypokalemia   . Kidney stone 2018    Past Surgical History:  Procedure Laterality Date  . NO PAST SURGERIES    . WISDOM TOOTH EXTRACTION      Family History  Problem Relation Age of Onset  . Cancer Mother        ovarian  . Asthma Mother     Social History  Substance Use Topics  . Smoking status: Former Smoker    Types: Cigarettes  . Smokeless tobacco: Never Used     Comment: with +preg test  . Alcohol use No    Allergies:  Allergies  Allergen Reactions  . Milk-Related Compounds Other (See Comments)    Reaction:  Unknown   . Penicillins Other (See Comments)    Reaction:  Unknown  Has patient had a PCN reaction causing immediate rash, facial/tongue/throat swelling, SOB or lightheadedness with hypotension: Unknown Has patient had a PCN reaction causing severe rash involving mucus membranes or skin necrosis: Unknown Has patient had a PCN reaction that required hospitalization: Unknown Has patient had a PCN reaction occurring within the last 10 years: No If all of the above answers are "NO", then may proceed with Cephalosporin use.    Prescriptions Prior to Admission  Medication Sig Dispense Refill Last Dose  . albuterol (PROVENTIL HFA;VENTOLIN HFA) 108 (90 Base) MCG/ACT  inhaler Inhale 2 puffs into the lungs every 6 (six) hours as needed for wheezing or shortness of breath.   Taking  . albuterol (PROVENTIL) (2.5 MG/3ML) 0.083% nebulizer solution Take 3 mLs (2.5 mg total) by nebulization every 6 (six) hours as needed for wheezing or shortness of breath. 75 mL 0 Taking  . cyclobenzaprine (FLEXERIL) 10 MG tablet Take 1 tablet (10 mg total) by mouth every 8 (eight) hours as needed for muscle spasms. 20 tablet 0 Taking  . Doxylamine-Pyridoxine ER (BONJESTA) 20-20 MG TBCR Take 1 tablet by mouth at bedtime. Take an additional 1 tablet with breakfast if s/s persist 60 tablet 1   . fluticasone (FLOVENT HFA) 110 MCG/ACT inhaler Inhale 2 puffs into the lungs 2 (two) times daily. 1 Inhaler 3   . metoCLOPramide (REGLAN) 10 MG tablet Take 1 tablet (10 mg total) by mouth every 6 (six) hours as needed for nausea (nausea/headache). 30 tablet 2 Taking  . metroNIDAZOLE (FLAGYL) 500 MG tablet Take 1 tablet (500 mg total) by mouth 2 (two) times daily. (Patient not taking: Reported on 06/08/2017) 14 tablet 0 Not Taking  . potassium chloride SA (K-DUR,KLOR-CON) 20 MEQ tablet Take 2 tablets (40 mEq total) by mouth daily. 6 tablet 0 Taking  .  Prenatal Vit-Fe Fumarate-FA (PRENATAL MULTIVITAMIN) TABS tablet Take 1 tablet by mouth daily at 12 noon.   Taking  . promethazine (PHENERGAN) 25 MG tablet Take 0.5-1 tablets (12.5-25 mg total) by mouth every 6 (six) hours as needed. 30 tablet 0 Taking  . ranitidine (ZANTAC) 150 MG capsule Take 1 capsule (150 mg total) by mouth 2 (two) times daily. 60 capsule 2     Review of Systems  Gastrointestinal: Negative for abdominal pain.  Genitourinary: Positive for vaginal discharge. Negative for dysuria and vaginal bleeding.   Physical Exam   Blood pressure (!) 101/51, pulse 72, temperature 98.1 F (36.7 C), temperature source Oral, resp. rate 18, last menstrual period 10/28/2016.  Physical Exam  Constitutional: She is oriented to person, place, and  time. She appears well-developed and well-nourished. No distress.  HENT:  Head: Normocephalic and atraumatic.  Neck: Normal range of motion.  Cardiovascular: Normal rate.   Respiratory: Effort normal.  GI: Soft. She exhibits no distension. There is no tenderness.  Genitourinary:  Genitourinary Comments: External: no lesions or erythema Vagina: rugated, pink, moist, thick mucous discharge, no pool, fern neg Cervix closed/thick   Musculoskeletal: Normal range of motion.  Neurological: She is alert and oriented to person, place, and time.  Skin: Skin is warm and dry.  Psychiatric: She has a normal mood and affect.  EFM: 130 bpm, mod variability, + accels, no decels Toco: rare  Results for orders placed or performed during the hospital encounter of 07/03/17 (from the past 24 hour(s))  Urinalysis, Routine w reflex microscopic     Status: Abnormal   Collection Time: 07/03/17  7:50 AM  Result Value Ref Range   Color, Urine YELLOW YELLOW   APPearance CLOUDY (A) CLEAR   Specific Gravity, Urine 1.017 1.005 - 1.030   pH 6.0 5.0 - 8.0   Glucose, UA NEGATIVE NEGATIVE mg/dL   Hgb urine dipstick NEGATIVE NEGATIVE   Bilirubin Urine NEGATIVE NEGATIVE   Ketones, ur NEGATIVE NEGATIVE mg/dL   Protein, ur NEGATIVE NEGATIVE mg/dL   Nitrite NEGATIVE NEGATIVE   Leukocytes, UA LARGE (A) NEGATIVE   RBC / HPF 0-5 0 - 5 RBC/hpf   WBC, UA 6-30 0 - 5 WBC/hpf   Bacteria, UA MANY (A) NONE SEEN   Squamous Epithelial / LPF TOO NUMEROUS TO COUNT (A) NONE SEEN   Mucus PRESENT     MAU Course  Procedures  MDM Labs ordered and reviewed. No evidence of ROM or PTL. Discussed comfort measures for pelvic pressure. Stable for discharge home.  Assessment and Plan   1. [redacted] weeks gestation of pregnancy   2. NST (non-stress test) reactive   3. Vaginal discharge during pregnancy in third trimester   4. Braxton Hicks contractions   5. Pelvic pressure in pregnancy, antepartum, third trimester    Discharge  home Follow up in OB office today as scheduled PTL precautions Rx maternity support belt  Allergies as of 07/03/2017      Reactions   Milk-related Compounds Other (See Comments)   Reaction:  Unknown    Penicillins Other (See Comments)   Reaction:  Unknown  Has patient had a PCN reaction causing immediate rash, facial/tongue/throat swelling, SOB or lightheadedness with hypotension: Unknown Has patient had a PCN reaction causing severe rash involving mucus membranes or skin necrosis: Unknown Has patient had a PCN reaction that required hospitalization: Unknown Has patient had a PCN reaction occurring within the last 10 years: No If all of the above answers are "NO", then may  proceed with Cephalosporin use.      Medication List    STOP taking these medications   metroNIDAZOLE 500 MG tablet Commonly known as:  FLAGYL   promethazine 25 MG tablet Commonly known as:  PHENERGAN     TAKE these medications   albuterol 108 (90 Base) MCG/ACT inhaler Commonly known as:  PROVENTIL HFA;VENTOLIN HFA Inhale 2 puffs into the lungs every 6 (six) hours as needed for wheezing or shortness of breath.   albuterol (2.5 MG/3ML) 0.083% nebulizer solution Commonly known as:  PROVENTIL Take 3 mLs (2.5 mg total) by nebulization every 6 (six) hours as needed for wheezing or shortness of breath.   COMFORT FIT MATERNITY SUPP LG Misc 1 application by Does not apply route daily.   cyclobenzaprine 10 MG tablet Commonly known as:  FLEXERIL Take 1 tablet (10 mg total) by mouth every 8 (eight) hours as needed for muscle spasms.   Doxylamine-Pyridoxine ER 20-20 MG Tbcr Commonly known as:  BONJESTA Take 1 tablet by mouth at bedtime. Take an additional 1 tablet with breakfast if s/s persist   fluticasone 110 MCG/ACT inhaler Commonly known as:  FLOVENT HFA Inhale 2 puffs into the lungs 2 (two) times daily.   metoCLOPramide 10 MG tablet Commonly known as:  REGLAN Take 1 tablet (10 mg total) by mouth every  6 (six) hours as needed for nausea (nausea/headache).   potassium chloride SA 20 MEQ tablet Commonly known as:  K-DUR,KLOR-CON Take 2 tablets (40 mEq total) by mouth daily.   prenatal multivitamin Tabs tablet Take 1 tablet by mouth daily at 12 noon.   ranitidine 150 MG capsule Commonly known as:  ZANTAC Take 1 capsule (150 mg total) by mouth 2 (two) times daily.            Discharge Care Instructions        Start     Ordered   07/03/17 0000  Discharge patient    Question Answer Comment  Discharge disposition 01-Home or Self Care   Discharge patient date 07/03/2017      07/03/17 0981   07/03/17 0000  Elastic Bandages & Supports (COMFORT FIT MATERNITY SUPP LG) MISC  Daily    Question:  Supervising Provider  Answer:  Tereso Newcomer   07/03/17 0852     Donette Larry, CNM 07/03/2017, 9:08 AM

## 2017-07-03 NOTE — MAU Note (Signed)
Contractions woke her at 0100, coming q 7 per EMS.  Pt reports some ? Leaking. No bleeding.  Loose stools

## 2017-07-03 NOTE — Discharge Instructions (Signed)

## 2017-07-04 ENCOUNTER — Encounter: Payer: Self-pay | Admitting: General Practice

## 2017-07-06 ENCOUNTER — Inpatient Hospital Stay (HOSPITAL_COMMUNITY): Payer: Medicaid Other

## 2017-07-06 ENCOUNTER — Inpatient Hospital Stay (HOSPITAL_COMMUNITY)
Admission: AD | Admit: 2017-07-06 | Discharge: 2017-07-06 | Disposition: A | Discharge status: Home / Self Care | Payer: Medicaid Other | Source: Ambulatory Visit | Attending: Obstetrics and Gynecology | Admitting: Obstetrics and Gynecology

## 2017-07-06 ENCOUNTER — Encounter (HOSPITAL_COMMUNITY): Payer: Self-pay

## 2017-07-06 DIAGNOSIS — Z880 Allergy status to penicillin: Secondary | ICD-10-CM | POA: Diagnosis not present

## 2017-07-06 DIAGNOSIS — O26893 Other specified pregnancy related conditions, third trimester: Secondary | ICD-10-CM | POA: Diagnosis not present

## 2017-07-06 DIAGNOSIS — O99323 Drug use complicating pregnancy, third trimester: Secondary | ICD-10-CM | POA: Insufficient documentation

## 2017-07-06 DIAGNOSIS — J45909 Unspecified asthma, uncomplicated: Secondary | ICD-10-CM | POA: Insufficient documentation

## 2017-07-06 DIAGNOSIS — R1110 Vomiting, unspecified: Secondary | ICD-10-CM | POA: Insufficient documentation

## 2017-07-06 DIAGNOSIS — F12188 Cannabis abuse with other cannabis-induced disorder: Secondary | ICD-10-CM

## 2017-07-06 DIAGNOSIS — Z87891 Personal history of nicotine dependence: Secondary | ICD-10-CM | POA: Insufficient documentation

## 2017-07-06 DIAGNOSIS — O26899 Other specified pregnancy related conditions, unspecified trimester: Secondary | ICD-10-CM

## 2017-07-06 DIAGNOSIS — O99283 Endocrine, nutritional and metabolic diseases complicating pregnancy, third trimester: Secondary | ICD-10-CM | POA: Insufficient documentation

## 2017-07-06 DIAGNOSIS — F1210 Cannabis abuse, uncomplicated: Secondary | ICD-10-CM | POA: Insufficient documentation

## 2017-07-06 DIAGNOSIS — R109 Unspecified abdominal pain: Secondary | ICD-10-CM | POA: Insufficient documentation

## 2017-07-06 DIAGNOSIS — O99513 Diseases of the respiratory system complicating pregnancy, third trimester: Secondary | ICD-10-CM | POA: Diagnosis not present

## 2017-07-06 DIAGNOSIS — Z3A35 35 weeks gestation of pregnancy: Secondary | ICD-10-CM | POA: Insufficient documentation

## 2017-07-06 DIAGNOSIS — Z79899 Other long term (current) drug therapy: Secondary | ICD-10-CM | POA: Diagnosis not present

## 2017-07-06 DIAGNOSIS — E876 Hypokalemia: Secondary | ICD-10-CM | POA: Insufficient documentation

## 2017-07-06 LAB — URINALYSIS, ROUTINE W REFLEX MICROSCOPIC
Bilirubin Urine: NEGATIVE
GLUCOSE, UA: NEGATIVE mg/dL
HGB URINE DIPSTICK: NEGATIVE
KETONES UR: 80 mg/dL — AB
LEUKOCYTES UA: NEGATIVE
Nitrite: NEGATIVE
PROTEIN: NEGATIVE mg/dL
Specific Gravity, Urine: 1.018 (ref 1.005–1.030)
pH: 7 (ref 5.0–8.0)

## 2017-07-06 LAB — RAPID URINE DRUG SCREEN, HOSP PERFORMED
Amphetamines: NOT DETECTED
Barbiturates: NOT DETECTED
Benzodiazepines: NOT DETECTED
Cocaine: NOT DETECTED
OPIATES: NOT DETECTED
Tetrahydrocannabinol: POSITIVE — AB

## 2017-07-06 LAB — CBC WITH DIFFERENTIAL/PLATELET
BASOS PCT: 0 %
Basophils Absolute: 0 10*3/uL (ref 0.0–0.1)
EOS ABS: 0 10*3/uL (ref 0.0–0.7)
EOS PCT: 0 %
HCT: 30.7 % — ABNORMAL LOW (ref 36.0–46.0)
Hemoglobin: 10.4 g/dL — ABNORMAL LOW (ref 12.0–15.0)
LYMPHS ABS: 1.8 10*3/uL (ref 0.7–4.0)
Lymphocytes Relative: 16 %
MCH: 29.9 pg (ref 26.0–34.0)
MCHC: 33.9 g/dL (ref 30.0–36.0)
MCV: 88.2 fL (ref 78.0–100.0)
MONO ABS: 0.4 10*3/uL (ref 0.1–1.0)
MONOS PCT: 3 %
Neutro Abs: 8.8 10*3/uL — ABNORMAL HIGH (ref 1.7–7.7)
Neutrophils Relative %: 81 %
PLATELETS: 172 10*3/uL (ref 150–400)
RBC: 3.48 MIL/uL — ABNORMAL LOW (ref 3.87–5.11)
RDW: 14.5 % (ref 11.5–15.5)
WBC: 10.9 10*3/uL — AB (ref 4.0–10.5)

## 2017-07-06 MED ORDER — DEXTROSE 5 % IN LACTATED RINGERS IV BOLUS
1000.0000 mL | Freq: Once | INTRAVENOUS | Status: AC
  Administered 2017-07-06: 1000 mL via INTRAVENOUS

## 2017-07-06 MED ORDER — ONDANSETRON 8 MG PO TBDP: 8.0000 mg | ORAL_TABLET | Freq: Three times a day (TID) | ORAL | 0 refills | Status: DC | PRN

## 2017-07-06 MED ORDER — HYDROMORPHONE HCL 2 MG/ML IJ SOLN
2.0000 mg | Freq: Once | INTRAMUSCULAR | Status: AC
  Administered 2017-07-06: 2 mg via INTRAVENOUS
  Filled 2017-07-06: qty 1

## 2017-07-06 MED ORDER — LACTATED RINGERS IV BOLUS (SEPSIS)
1000.0000 mL | Freq: Once | INTRAVENOUS | Status: AC
  Administered 2017-07-06: 1000 mL via INTRAVENOUS

## 2017-07-06 MED ORDER — LORAZEPAM 2 MG/ML IJ SOLN
1.0000 mg | Freq: Once | INTRAMUSCULAR | Status: AC
  Administered 2017-07-06: 1 mg via INTRAVENOUS
  Filled 2017-07-06: qty 0.5

## 2017-07-06 MED ORDER — HYDROMORPHONE HCL 2 MG PO TABS: 2.0000 mg | ORAL_TABLET | Freq: Four times a day (QID) | ORAL | 0 refills | Status: DC | PRN

## 2017-07-06 MED ORDER — HYDROXYZINE HCL 50 MG/ML IM SOLN
50.0000 mg | Freq: Once | INTRAMUSCULAR | Status: AC
  Administered 2017-07-06: 50 mg via INTRAMUSCULAR
  Filled 2017-07-06: qty 1

## 2017-07-06 MED ORDER — NIFEDIPINE 10 MG PO CAPS
20.0000 mg | ORAL_CAPSULE | Freq: Once | ORAL | Status: AC
  Administered 2017-07-06: 20 mg via ORAL
  Filled 2017-07-06: qty 2

## 2017-07-06 MED ORDER — HYDROMORPHONE HCL 1 MG/ML IJ SOLN
1.0000 mg | Freq: Once | INTRAMUSCULAR | Status: DC
Start: 2017-07-06 — End: 2017-07-06

## 2017-07-06 MED ORDER — FENTANYL CITRATE (PF) 100 MCG/2ML IJ SOLN
50.0000 ug | Freq: Once | INTRAMUSCULAR | Status: AC
  Administered 2017-07-06: 50 ug via INTRAVENOUS
  Filled 2017-07-06: qty 2

## 2017-07-06 MED ORDER — DICYCLOMINE HCL 10 MG/ML IM SOLN: 20.0000 mg | Freq: Once | INTRAMUSCULAR | Status: DC

## 2017-07-06 MED ORDER — LACTATED RINGERS IV SOLN
INTRAVENOUS | Status: DC
  Administered 2017-07-06: 13:00:00 via INTRAVENOUS

## 2017-07-06 MED ORDER — DICYCLOMINE HCL 20 MG PO TABS: 20.0000 mg | ORAL_TABLET | Freq: Three times a day (TID) | ORAL | 0 refills | Status: DC | PRN

## 2017-07-06 MED ORDER — DICYCLOMINE HCL 10 MG PO CAPS
10.0000 mg | ORAL_CAPSULE | Freq: Once | ORAL | Status: AC
  Administered 2017-07-06: 10 mg via ORAL
  Filled 2017-07-06: qty 1

## 2017-07-06 MED ORDER — ONDANSETRON HCL 4 MG/2ML IJ SOLN
4.0000 mg | Freq: Once | INTRAMUSCULAR | Status: AC
  Administered 2017-07-06: 4 mg via INTRAVENOUS
  Filled 2017-07-06: qty 2

## 2017-07-06 MED ORDER — SCOPOLAMINE 1 MG/3DAYS TD PT72
1.0000 | MEDICATED_PATCH | TRANSDERMAL | Status: DC
  Administered 2017-07-06: 1.5 mg via TRANSDERMAL
  Filled 2017-07-06: qty 1

## 2017-07-06 NOTE — MAU Note (Signed)
Case manager called back and she is unable to get financial assistance for patients with insurance. Pt agreeable to leave. Told patient to please try to get meds and to make prenatal appointment

## 2017-07-06 NOTE — MAU Note (Signed)
Pt presents to MAU via EMS with Contractions that started at midnight. Pt has no LOF, no bleeding and has had mucous discharge. +FM

## 2017-07-06 NOTE — Discharge Instructions (Signed)
Cannabis Use Disorder Cannabis use disorder is when using marijuana disrupts a person's daily life or causes health problems. This condition can be dangerous. The health problems this condition can cause include:  Long-lasting problems with thinking and learning. These can be permanent in young people.  Severe anxiety.  Paranoia.  Hallucinations.  Dangerously high blood pressure and heart rate.  Schizophrenia.  Breathing problems.  Problems with child development during and after pregnancy.  People with this condition are also more likely to use other drugs. What are the causes? This condition is caused by using marijuana too much over time. It is not caused by using it only once in a while. Many people with this condition use marijuana because it gives them a feeling of extreme pleasure or relaxation. What increases the risk? This condition is more likely to develop in:  Men.  People with a family history of cannabis use disorder.  People with mental health issues such as depression or post-traumatic stress disorder.  What are the signs or symptoms? Symptoms of this condition include:  Using greater amounts of marijuana than you want to, or using marijuana for longer than you want to.  Craving marijuana.  Spending a lot of time getting marijuana and using it or recovering from its effects.  Having problems at work, at school, at home, or in relationships because of marijuana use.  Giving up or cutting down on important life activities because of marijuana use.  Using marijuana at times when it is dangerous, such as while you are driving a car.  Needing more and more marijuana to get the same effect you want from the marijuana (building up a tolerance).  Physical problems, such as: ? A long-lasting cough. ? Bronchitis. ? Emphysema. ? Throat and lung cancer.  Mental problems, such as: ? Psychosis. ? Anxiety. ? Trouble sleeping.  Having symptoms of withdrawal  when you stop using marijuana. Symptom of withdrawal include: ? Irritability or anger. ? Anxiety or restlessness. ? Trouble sleeping. ? Loss of appetite or weight loss. ? Aches and pains. ? Shakiness, sweating, or chills.  How is this diagnosed? This condition is diagnosed with an assessment. During the assessment, your health care provider will ask about your marijuana use and about how it affects your life. You will be diagnosed with the condition if you have had at least two symptoms of this condition within a 12-month period. How severe the condition is depends on how many symptoms you have.  If you have two to three symptoms, your condition is mild.  If you have four to five symptoms, your condition is moderate.  If you have six or more symptoms, your condition is severe.  Your health care provider may perform a physical exam or do lab tests to see if you have physical problems resulting from marijuana use. Your health care provider may also screen for drug use and refer you to a mental health professional for evaluation. How is this treated? Treatment for this condition is usually provided by mental health professionals with training in substance use disorders. Your treatment may involve:  Counseling. This treatment is also called talk therapy. It is provided by substance use treatment counselors. A counselor can address the reasons you use marijuana and suggest ways to keep you from using it again. The goals of talk therapy are to: ? Find healthy activities to replace using marijuana. ? Identify and avoid the things that trigger your marijuana use. ? Help you learn how to handle   cravings.  Support groups. Support groups are led by people who have quit using marijuana. They provide emotional support, advice, and guidance.  Medicine. Medicine is used to treat mental health issues that trigger marijuana use or that result from it.  Follow these instructions at home:  Take  over-the-counter and prescription medicines only as told by your health care provider.  Check with your health care provider before starting any new medicines.  Keep all follow-up visits as told by your health care provider. This is important.  Work with your counselor or group to develop tools to keep you from using marijuana again (relapsing).  Make healthy lifestyle choices, such as: ? Eating a healthy diet. ? Getting enough exercise. ? Improving your stress-management skills.  Learn daily living skills and work skills. Where to find more information:  National Institute on Drug Abuse: www.drugabuse.gov  Substance Abuse and Mental Health Services Administration: www.samhsa.gov Contact a health care provider if:  You are not able to take your medicines as told.  Your symptoms get worse. Get help right away if:  You have serious thoughts about hurting yourself or others. If you ever feel like you may hurt yourself or others, or have thoughts about taking your own life, get help right away. You can go to your nearest emergency department or call:  Your local emergency services (911 in the [place]  A suicide crisis helpline, such as the National Suicide Prevention Lifeline at 1-800-273-8255. This is open 24 hours a day.  This information is not intended to replace advice given to you by your health care provider. Make sure you discuss any questions you have with your health care provider. Document Released: 10/07/2000 Document Revised: 07/08/2016 Document Reviewed: 07/08/2016 Elsevier Interactive Patient Education  2018 [employer]vier Inc.  

## 2017-07-06 NOTE — MAU Note (Signed)
Pt was sitting up in bed during admission questions, FHR was not tracing and was asked to lie back to readjust US. Pt lied back and became unconscious, other RN's were called in room as well as MAU provider, Code button was hit to have team available if needed. RN was able to palpate pulse, ammonia and sternal rub given. Team arrived and pt became alert and oriented to place. Pt intermittently screaming and falling asleep.

## 2017-07-06 NOTE — MAU Note (Signed)
U/S at bedside. Pt remaining quiet and comfortable during exam.

## 2017-07-06 NOTE — MAU Note (Signed)
Pt feeling better. Discharge instructions and Rx given. Pt requesting Case manger for help with paying for RX

## 2017-07-06 NOTE — MAU Provider Note (Signed)
History     CSN: [id number]  Arrival date and time: 07/06/17 6578  Chief Complaint  Patient presents with  . Contractions   [id number] .6 wks here via EMS with abdominal pain. Pain started around 0100. Describes as intermittent like ctx. No VB or LOF. Reports good FM. Hx of Marijuana use, denies recent use and other drug use. Her pregnancy has been complicated by limited PNC (one visit) and multiple (9) visits to MAU for abdominal pain/ctx.    OB History    Gravida Para Term Preterm AB Living   0 1 2   SAB TAB Ectopic Multiple Live Births   0 0 0 0 2      Obstetric Comments   G1: 2014 3430gm SVD G2: 01/2014 3195gm SVD/2nd      Past Medical History:  Diagnosis Date  . Anemia   . Asthma   . Hypokalemia   . Kidney stone 2018    Past Surgical History:  Procedure Laterality Date  . NO PAST SURGERIES    . WISDOM TOOTH EXTRACTION      Family History  Problem Relation Age of Onset  . Cancer Mother        ovarian  . Asthma Mother     Social History  Substance Use Topics  . Smoking status: Former Smoker    Types: Cigarettes  . Smokeless tobacco: Never Used     Comment: with +preg test  . Alcohol use No   Allergies:  Allergies  Allergen Reactions  . Milk-Related Compounds Other (See Comments)    Reaction:  Unknown   . Penicillins Other (See Comments)    Reaction:  Unknown  Has patient had a PCN reaction causing immediate rash, facial/tongue/throat swelling, SOB or lightheadedness with hypotension: Unknown Has patient had a PCN reaction causing severe rash involving mucus membranes or skin necrosis: Unknown Has patient had a PCN reaction that required hospitalization: Unknown Has patient had a PCN reaction occurring within the last 10 years: No If all of the above answers are "NO", then may proceed with Cephalosporin use.    Prescriptions Prior to Admission  Medication Sig Dispense Refill Last Dose  . albuterol (PROVENTIL HFA;VENTOLIN HFA) 108 (90 Base)  MCG/ACT inhaler Inhale 2 puffs into the lungs every 6 (six) hours as needed for wheezing or shortness of breath.   Taking  . albuterol (PROVENTIL) (2.5 MG/3ML) 0.083% nebulizer solution Take 3 mLs (2.5 mg total) by nebulization every 6 (six) hours as needed for wheezing or shortness of breath. 75 mL 0 Taking  . cyclobenzaprine (FLEXERIL) 10 MG tablet Take 1 tablet (10 mg total) by mouth every 8 (eight) hours as needed for muscle spasms. 20 tablet 0 Taking  . Doxylamine-Pyridoxine ER (BONJESTA) 20-20 MG TBCR Take 1 tablet by mouth at bedtime. Take an additional 1 tablet with breakfast if s/s persist 60 tablet 1   . Elastic Bandages & Supports (COMFORT FIT MATERNITY SUPP LG) MISC 1 application by Does not apply route daily. 1 each 0   . fluticasone (FLOVENT HFA) 110 MCG/ACT inhaler Inhale 2 puffs into the lungs 2 (two) times daily. 1 Inhaler 3   . metoCLOPramide (REGLAN) 10 MG tablet Take 1 tablet (10 mg total) by mouth every 6 (six) hours as needed for nausea (nausea/headache). 30 tablet 2 Taking  . potassium chloride SA (K-DUR,KLOR-CON) 20 MEQ tablet Take 2 tablets (40 mEq total) by mouth daily. 6 tablet 0 Taking  . Prenatal Vit-Fe Fumarate-FA (PRENATAL MULTIVITAMIN) TABS  tablet Take 1 tablet by mouth daily at 12 noon.   Taking  . ranitidine (ZANTAC) 150 MG capsule Take 1 capsule (150 mg total) by mouth 2 (two) times daily. 60 capsule 2     Review of Systems  Gastrointestinal: Positive for abdominal pain (ctx).  Genitourinary: Negative for vaginal bleeding.   Physical Exam   Blood pressure (!) 115/56, pulse 92, temperature 97.7 F (36.5 C), temperature source Oral, resp. rate 18, last menstrual period 10/28/2016.  Physical Exam  Nursing note and vitals reviewed. Constitutional: She is oriented to person, place, and time. Vital signs are normal. She appears well-developed and well-nourished. She appears distressed (intermittent screaming).  HENT:  Head: Normocephalic and atraumatic.  Neck:  Normal range of motion.  Cardiovascular: Normal rate.   Respiratory: Effort normal. No respiratory distress.  GI: Soft. She exhibits no distension. There is no tenderness.  gravid  Musculoskeletal: Normal range of motion.  Neurological: She is alert and oriented to person, place, and time.  Skin: Skin is warm and dry.  Psychiatric: She has a normal mood and affect.  EFM: 120 bpm, mod variability, + accels, no decels Toco: q5  Results for orders placed or performed during the hospital encounter of 07/06/17 (from the past 24 hour(s))  Urine rapid drug screen (hosp performed)     Status: Abnormal   Collection Time: 07/06/17  6:55 AM  Result Value Ref Range   Opiates NONE DETECTED NONE DETECTED   Cocaine NONE DETECTED NONE DETECTED   Benzodiazepines NONE DETECTED NONE DETECTED   Amphetamines NONE DETECTED NONE DETECTED   Tetrahydrocannabinol POSITIVE (A) NONE DETECTED   Barbiturates NONE DETECTED NONE DETECTED  Urinalysis, Routine w reflex microscopic     Status: Abnormal   Collection Time: 07/06/17  6:58 AM  Result Value Ref Range   Color, Urine YELLOW YELLOW   APPearance CLEAR CLEAR   Specific Gravity, Urine 1.018 1.005 - 1.030   pH 7.0 5.0 - 8.0   Glucose, UA NEGATIVE NEGATIVE mg/dL   Hgb urine dipstick NEGATIVE NEGATIVE   Bilirubin Urine NEGATIVE NEGATIVE   Ketones, ur 80 (A) NEGATIVE mg/dL   Protein, ur NEGATIVE NEGATIVE mg/dL   Nitrite NEGATIVE NEGATIVE   Leukocytes, UA NEGATIVE NEGATIVE  CBC with Differential     Status: Abnormal   Collection Time: 07/06/17  1:33 PM  Result Value Ref Range   WBC 10.9 (H) 4.0 - 10.5 K/uL   RBC 3.48 (L) 3.87 - 5.11 MIL/uL   Hemoglobin 10.4 (L) 12.0 - 15.0 g/dL   HCT 40.9 (L) 81.1 - 91.4 %   MCV 88.2 78.0 - 100.0 fL   MCH 29.9 26.0 - 34.0 pg   MCHC 33.9 30.0 - 36.0 g/dL   RDW 78.2 95.6 - 21.3 %   Platelets 172 150 - 400 K/uL   Neutrophils Relative % 81 %   Neutro Abs 8.8 (H) 1.7 - 7.7 K/uL   Lymphocytes Relative 16 %   Lymphs Abs  1.8 0.7 - 4.0 K/uL   Monocytes Relative 3 %   Monocytes Absolute 0.4 0.1 - 1.0 K/uL   Eosinophils Relative 0 %   Eosinophils Absolute 0.0 0.0 - 0.7 K/uL   Basophils Relative 0 %   Basophils Absolute 0.0 0.0 - 0.1 K/uL   MAU Course  Procedures Vistaril Fentanyl Limited OB US  MDM Prior to my entering the room she had a syncopal episode for approx 1 minute. She responded to ammonia and sternal rub. Pt came to and was  alert and oriented x3 but appeared lethargic for a few minutes. VSS. EKG normal. UDS ordered. No signs of labor. US ordered r/o abruption. Transfer of care given to J.Norene Oliveri, FNP Donette LarryBhambri, Melanie, CNM  07/06/2017 8:07 AM   Cervix rechecked, and unchanged Procardia 20 mg PO given Fentanyl 50 mcg IV X2 doses  Zofran 4 mg IV  Abdomen soft on exam. Contractions not palpated US WNL. No acute findings.  Discussed with Dr. Alysia PennaErvin; will given dilaudid 2 mg, Ativan 1 mg and Bentyl. Had a long discussion with patient reguarding THC use. Patient states she was using every single day, multiple times per day. States she has extreme nausea that is somewhat relieved by hot showers. States she takes several showers per day. She also states she has been vomiting multiple times per day. Urine shows >80 ketones today and prior visits. D5LR bolus X 1 & LR bolus x2, Highly suspect cannabis hyperemesis. Discussed patient in further detail with Ervin. Following fluids and medication patient is no longer screaming, patient is appropriate when talking with. Patient states she is feeling better although worried that the pain will come back. Patient up to the bathroom without difficulty. Ok for discharge home at this time.   Assessment and Plan   A:  1. Cannabis hyperemesis syndrome concurrent with and due to cannabis abuse (HCC)   2. Abdominal pain affecting pregnancy     P:  Discharge home in stable condition Stop THC use Rx: Dilaudid, Zofran, Bentyl Return to MAU if symptoms worsen   Preterm labor precautions  Reign Dziuba, Harolyn RutherfordJennifer I, NP 07/06/2017 7:03 PM

## 2017-07-06 NOTE — MAU Note (Signed)
Pt is very restless and screaming out in "pain" stated she is having ctx. No contraction on monitor and none palpated at he times she is feeling pai. Baby movement is heard and felt and possible causing pain.  Unable to get IV access  At this time. IM vistaril ordered and given. Within  5 min pt is calmer and resting quietly.

## 2017-07-06 NOTE — MAU Note (Signed)
Pt becoming more agitated again and crying out in pain. Provider notified. Consulting with Dr. Alysia PennaErvin. New orders pending for pain meds.

## 2017-07-06 NOTE — MAU Note (Signed)
Karie MainlandAli, CRN at bedside to attempt IV access. Attemped 3 times without success. Sharee PimpleWynn, CRN attempted in foot x2 without success. Dr. Malen GauzeFoster inserted 20 g in L wrist.

## 2017-07-06 NOTE — MAU Note (Signed)
Pt is calmer and more relaxed. Reports pain is almost gone. Feeling better.

## 2017-07-07 ENCOUNTER — Inpatient Hospital Stay (EMERGENCY_DEPARTMENT_HOSPITAL)
Admission: AD | Admit: 2017-07-07 | Discharge: 2017-07-07 | Disposition: A | Discharge status: Home / Self Care | Payer: Medicaid Other | Source: Ambulatory Visit | Attending: Obstetrics and Gynecology | Admitting: Obstetrics and Gynecology

## 2017-07-07 ENCOUNTER — Encounter (HOSPITAL_COMMUNITY): Payer: Self-pay | Admitting: *Deleted

## 2017-07-07 ENCOUNTER — Encounter (HOSPITAL_COMMUNITY): Payer: Self-pay

## 2017-07-07 ENCOUNTER — Inpatient Hospital Stay (HOSPITAL_COMMUNITY)
Admission: AD | Admit: 2017-07-07 | Discharge: 2017-07-07 | Disposition: A | Discharge status: Home / Self Care | Payer: Medicaid Other | Source: Ambulatory Visit | Attending: Obstetrics and Gynecology | Admitting: Obstetrics and Gynecology

## 2017-07-07 DIAGNOSIS — R102 Pelvic and perineal pain: Secondary | ICD-10-CM | POA: Insufficient documentation

## 2017-07-07 DIAGNOSIS — O99323 Drug use complicating pregnancy, third trimester: Secondary | ICD-10-CM | POA: Diagnosis not present

## 2017-07-07 DIAGNOSIS — O4703 False labor before 37 completed weeks of gestation, third trimester: Secondary | ICD-10-CM | POA: Diagnosis not present

## 2017-07-07 DIAGNOSIS — Z880 Allergy status to penicillin: Secondary | ICD-10-CM | POA: Insufficient documentation

## 2017-07-07 DIAGNOSIS — J45909 Unspecified asthma, uncomplicated: Secondary | ICD-10-CM | POA: Diagnosis not present

## 2017-07-07 DIAGNOSIS — Z3A36 36 weeks gestation of pregnancy: Secondary | ICD-10-CM | POA: Diagnosis not present

## 2017-07-07 DIAGNOSIS — O479 False labor, unspecified: Secondary | ICD-10-CM

## 2017-07-07 DIAGNOSIS — E876 Hypokalemia: Secondary | ICD-10-CM | POA: Diagnosis not present

## 2017-07-07 DIAGNOSIS — Z91011 Allergy to milk products: Secondary | ICD-10-CM | POA: Diagnosis not present

## 2017-07-07 DIAGNOSIS — Z8041 Family history of malignant neoplasm of ovary: Secondary | ICD-10-CM | POA: Insufficient documentation

## 2017-07-07 DIAGNOSIS — Z87891 Personal history of nicotine dependence: Secondary | ICD-10-CM | POA: Diagnosis not present

## 2017-07-07 DIAGNOSIS — F1210 Cannabis abuse, uncomplicated: Secondary | ICD-10-CM | POA: Insufficient documentation

## 2017-07-07 DIAGNOSIS — Z825 Family history of asthma and other chronic lower respiratory diseases: Secondary | ICD-10-CM | POA: Insufficient documentation

## 2017-07-07 DIAGNOSIS — E669 Obesity, unspecified: Secondary | ICD-10-CM | POA: Insufficient documentation

## 2017-07-07 DIAGNOSIS — Z79899 Other long term (current) drug therapy: Secondary | ICD-10-CM | POA: Insufficient documentation

## 2017-07-07 DIAGNOSIS — R109 Unspecified abdominal pain: Secondary | ICD-10-CM

## 2017-07-07 DIAGNOSIS — O99283 Endocrine, nutritional and metabolic diseases complicating pregnancy, third trimester: Secondary | ICD-10-CM | POA: Diagnosis not present

## 2017-07-07 DIAGNOSIS — O26893 Other specified pregnancy related conditions, third trimester: Secondary | ICD-10-CM | POA: Diagnosis present

## 2017-07-07 DIAGNOSIS — O99513 Diseases of the respiratory system complicating pregnancy, third trimester: Secondary | ICD-10-CM | POA: Diagnosis not present

## 2017-07-07 DIAGNOSIS — Z87442 Personal history of urinary calculi: Secondary | ICD-10-CM | POA: Diagnosis not present

## 2017-07-07 DIAGNOSIS — Z79891 Long term (current) use of opiate analgesic: Secondary | ICD-10-CM | POA: Insufficient documentation

## 2017-07-07 DIAGNOSIS — F12188 Cannabis abuse with other cannabis-induced disorder: Secondary | ICD-10-CM

## 2017-07-07 DIAGNOSIS — N949 Unspecified condition associated with female genital organs and menstrual cycle: Secondary | ICD-10-CM

## 2017-07-07 DIAGNOSIS — O9989 Other specified diseases and conditions complicating pregnancy, childbirth and the puerperium: Secondary | ICD-10-CM

## 2017-07-07 DIAGNOSIS — O99213 Obesity complicating pregnancy, third trimester: Secondary | ICD-10-CM | POA: Diagnosis not present

## 2017-07-07 LAB — URINALYSIS, ROUTINE W REFLEX MICROSCOPIC
Bilirubin Urine: NEGATIVE
Glucose, UA: NEGATIVE mg/dL
Hgb urine dipstick: NEGATIVE
KETONES UR: 20 mg/dL — AB
Leukocytes, UA: NEGATIVE
NITRITE: NEGATIVE
PROTEIN: NEGATIVE mg/dL
Specific Gravity, Urine: 1.016 (ref 1.005–1.030)
pH: 7 (ref 5.0–8.0)

## 2017-07-07 LAB — WET PREP, GENITAL
CLUE CELLS WET PREP: NONE SEEN
Sperm: NONE SEEN
TRICH WET PREP: NONE SEEN
YEAST WET PREP: NONE SEEN

## 2017-07-07 LAB — RAPID URINE DRUG SCREEN, HOSP PERFORMED
Amphetamines: NOT DETECTED
BARBITURATES: NOT DETECTED
Benzodiazepines: POSITIVE — AB
Cocaine: NOT DETECTED
Opiates: POSITIVE — AB
TETRAHYDROCANNABINOL: POSITIVE — AB

## 2017-07-07 LAB — OB RESULTS CONSOLE GC/CHLAMYDIA: GC PROBE AMP, GENITAL: NEGATIVE

## 2017-07-07 MED ORDER — HYDROMORPHONE HCL 1 MG/ML IJ SOLN: 1.0000 mg | Freq: Once | INTRAMUSCULAR | Status: DC

## 2017-07-07 MED ORDER — LACTATED RINGERS IV BOLUS (SEPSIS): 1000.0000 mL | Freq: Once | INTRAVENOUS | Status: DC

## 2017-07-07 MED ORDER — MEPERIDINE HCL 25 MG/ML IJ SOLN
50.0000 mg | Freq: Once | INTRAMUSCULAR | Status: AC
  Administered 2017-07-07: 50 mg via INTRAMUSCULAR
  Filled 2017-07-07: qty 2

## 2017-07-07 MED ORDER — HYDROXYZINE HCL 50 MG/ML IM SOLN
25.0000 mg | Freq: Once | INTRAMUSCULAR | Status: AC
Start: 2017-07-07 — End: 2017-07-07
  Administered 2017-07-07: 25 mg via INTRAMUSCULAR
  Filled 2017-07-07: qty 0.5

## 2017-07-07 MED ORDER — FENTANYL CITRATE (PF) 100 MCG/2ML IJ SOLN: 100.0000 ug | INTRAMUSCULAR | Status: DC | PRN

## 2017-07-07 NOTE — MAU Note (Signed)
E-Signature not working in the room.

## 2017-07-07 NOTE — MAU Note (Signed)
+  lower abdominal and vaginal pain Started today Intermittent Rating pain 10/10; took dilaudid earlier for pain with no relief  Denies LOF or VB; states lost her mucus plug  +FM

## 2017-07-07 NOTE — MAU Provider Note (Signed)
History     CSN: [id number]  Arrival date and time: 07/07/17 1601   First Provider Initiated Contact with Patient 07/07/17 1759      Chief Complaint  Patient presents with  . Abdominal Pain    HPI: Mercedes [name] is a 26 y.o. [phone] with IUP at [redacted]w[redacted]d who presents to maternity admissions reporting contractions with severe abdominal and vaginal pain. She was seen for the same this morning and several other recent visit. She reports ctx every 5 minutes. She was prescribed po dilaudid for pain yesterday, but reports unrelieved pain with this.    Denies leakage of fluid or vaginal bleeding. Also denies any abnormal vaginal discharge, fevers, chills, sweats, dysuria, vomiting, other GI or GU symptoms or other general symptoms.  She receives [facility]. Pregnancy complicated by scant prenatal care, obesity, marijuana use, and asthma.  Past obstetric history: OB History  Gravida Para Term Preterm AB Living  0 1 2  SAB TAB Ectopic Multiple Live Births  0 0 0 0 2    # Outcome Date GA Lbr Len/2nd Weight Sex Delivery Anes PTL Lv  4 Current           3 AB           2 Term      Vag-Spont     1 Term      Vag-Spont       Obstetric Comments  G1: 2014 3430gm SVD  G2: 01/2014 3195gm SVD/2nd    Past Medical History:  Diagnosis Date  . Anemia   . Asthma   . Hypokalemia   . Kidney stone 2018    Past Surgical History:  Procedure Laterality Date  . NO PAST SURGERIES    . WISDOM TOOTH EXTRACTION      Family History  Problem Relation Age of Onset  . Cancer Mother        ovarian  . Asthma Mother     Social History  Substance Use Topics  . Smoking status: Former Smoker    Types: Cigarettes  . Smokeless tobacco: Never Used     Comment: with +preg test  . Alcohol use No    Allergies:  Allergies  Allergen Reactions  . Milk-Related Compounds Other (See Comments)    Reaction:  Unknown   . Penicillins Other (See Comments)    Reaction:  Unknown  Has patient had a  PCN reaction causing immediate rash, facial/tongue/throat swelling, SOB or lightheadedness with hypotension: Unknown Has patient had a PCN reaction causing severe rash involving mucus membranes or skin necrosis: Unknown Has patient had a PCN reaction that required hospitalization: Unknown Has patient had a PCN reaction occurring within the last 10 years: No If all of the above answers are "NO", then may proceed with Cephalosporin use.    Prescriptions Prior to Admission  Medication Sig Dispense Refill Last Dose  . acetaminophen (TYLENOL) 500 MG tablet Take 500 mg by mouth every 6 (six) hours as needed for moderate pain.   Past Month at Unknown time  . albuterol (PROVENTIL HFA;VENTOLIN HFA) 108 (90 Base) MCG/ACT inhaler Inhale 2 puffs into the lungs every 6 (six) hours as needed for wheezing or shortness of breath.   Past Month at Unknown time  . albuterol (PROVENTIL) (2.5 MG/3ML) 0.083% nebulizer solution Take 3 mLs (2.5 mg total) by nebulization every 6 (six) hours as needed for wheezing or shortness of breath. 75 mL 0 Past Week at Unknown time  .  dicyclomine (BENTYL) 20 MG tablet Take 1 tablet (20 mg total) by mouth 3 (three) times daily as needed for spasms. 20 tablet 0   . Doxylamine-Pyridoxine ER (BONJESTA) 20-20 MG TBCR Take 1 tablet by mouth at bedtime. Take an additional 1 tablet with breakfast if s/s persist 60 tablet 1 Past Week at Unknown time  . Elastic Bandages & Supports (COMFORT FIT MATERNITY SUPP LG) MISC 1 application by Does not apply route daily. 1 each 0   . fluticasone (FLOVENT HFA) 110 MCG/ACT inhaler Inhale 2 puffs into the lungs 2 (two) times daily. 1 Inhaler 3 Past Week at Unknown time  . HYDROmorphone (DILAUDID) 2 MG tablet Take 1 tablet (2 mg total) by mouth every 6 (six) hours as needed for severe pain. 10 tablet 0   . metoCLOPramide (REGLAN) 10 MG tablet Take 1 tablet (10 mg total) by mouth every 6 (six) hours as needed for nausea (nausea/headache). 30 tablet 2 Past Week  at Unknown time  . ondansetron (ZOFRAN ODT) 8 MG disintegrating tablet Take 1 tablet (8 mg total) by mouth every 8 (eight) hours as needed for nausea or vomiting. 20 tablet 0   . potassium chloride SA (K-DUR,KLOR-CON) 20 MEQ tablet Take 2 tablets (40 mEq total) by mouth daily. 6 tablet 0 Taking  . Prenatal Vit-Fe Fumarate-FA (PRENATAL MULTIVITAMIN) TABS tablet Take 1 tablet by mouth daily at 12 noon.   07/06/2017 at Unknown time  . prochlorperazine (COMPAZINE) 10 MG tablet Take 10 mg by mouth every 6 (six) hours as needed for nausea or vomiting.   07/06/2017 at Unknown time  . ranitidine (ZANTAC) 150 MG capsule Take 1 capsule (150 mg total) by mouth 2 (two) times daily. 60 capsule 2     Review of Systems - Negative except for what is mentioned in HPI.  Physical Exam   Blood pressure 123/68, pulse 72, temperature 97.6 F (36.4 C), temperature source Oral, resp. rate 17, weight 185 lb 1.3 oz (84 kg), last menstrual period 10/28/2016, SpO2 100 %.  Constitutional: Well-developed, well-nourished female. Intermittently screaming in pain, otherwise in NAD  Cardiovascular: normal rate Respiratory: normal WOB GI: Abd soft, non-tender, gravid appropriate for gestational age.  . MSK: Extremities nontender, no edema Neurologic: Alert and oriented x 4. Skin: warm and dry   Dilation:  (2 externally, closed internally) Effacement (%): Thick Cervical Position: Posterior Station: Ballotable Presentation: Vertex Exam by:: Judy Pimple   FHT:  Baseline 135 , moderate variability, accelerations present, no decelerations Toco: irregular ctx; difficult to trace d/t pt's movement and pulling out toco  MAU Course  Procedures  MDM Patient seen and evaluated. SVE closed and unchanged after an hour. Reviewed patient's chart. Pt's seen for same complaints several times. Pt intermittently screaming, but NAD on exam. VSS. Reactive NST. Had normal labs yeterday (other than UDS +TCH, benzo). Also had  normal U/S yesterday. Consulted with Dr. Vergie Living who also saw patient. Patient was offered repeat labs, but refused. Patient left before discharge order placed and w/o discharge instructions.   Assessment and Plan   26 y.o. [id number] with IUP at [redacted]w[redacted]d with recurrent presentations for abdominal pain, vaginal pain. Recent labs and U/S reassuring as noted above.  --D/c home in stable condition. Pt left w/o discharge instructions.   Degele, Kandra Nicolas, MD 07/07/2017 6:19 PM

## 2017-07-07 NOTE — MAU Note (Signed)
Patient states she does not have the $3.00 for her prescription for Dilaudid. Gave her $3.00 and she promised she will get her meds.

## 2017-07-07 NOTE — MAU Provider Note (Signed)
Chief Complaint:  Contractions   First Provider Initiated Contact with Patient 07/07/17 [phone]      HPI: Mercedes [name] is a 26 y.o. [id number] at [redacted]w[redacted]d who presents to maternity admissions via EMS for abdominal and vaginal pain. She is screaming intermittently and writhing around in bed upon arrival but alert and oriented, able to answer questions without difficulty.  The pain is intermittent, low in her abdomen and associated with vaginal/pelvic pressure and decreased fetal movement. The pain started yesterday am and briefly went away after her MAU visit but returned right away. She has not picked up the pain medication prescribed to her.  She was seen in MAU on 07/06/17 and was evaluated for 9+ hours including labwork, limited OB US, NST, and cervical exams.  No evidence of labor was found on earlier MAU visit. She reports she had her 2 previous babies vaginally and did not hurt like this.  She reports good fetal movement, denies LOF, vaginal bleeding, vaginal itching/burning, urinary symptoms, h/a, dizziness, n/v, or fever/chills.    HPI  Past Medical History: Past Medical History:  Diagnosis Date  . Anemia   . Asthma   . Hypokalemia   . Kidney stone 2018    Past obstetric history: OB History  Gravida Para Term Preterm AB Living  0 1 2  SAB TAB Ectopic Multiple Live Births  0 0 0 0 2    # Outcome Date GA Lbr Len/2nd Weight Sex Delivery Anes PTL Lv  4 Current           3 AB           2 Term      Vag-Spont     1 Term      Vag-Spont       Obstetric Comments  G1: 2014 3430gm SVD  G2: 01/2014 3195gm SVD/2nd    Past Surgical History: Past Surgical History:  Procedure Laterality Date  . NO PAST SURGERIES    . WISDOM TOOTH EXTRACTION      Family History: Family History  Problem Relation Age of Onset  . Cancer Mother        ovarian  . Asthma Mother     Social History: Social History  Substance Use Topics  . Smoking status: Former Smoker    Types: Cigarettes  .  Smokeless tobacco: Never Used     Comment: with +preg test  . Alcohol use No    Allergies:  Allergies  Allergen Reactions  . Milk-Related Compounds Other (See Comments)    Reaction:  Unknown   . Penicillins Other (See Comments)    Reaction:  Unknown  Has patient had a PCN reaction causing immediate rash, facial/tongue/throat swelling, SOB or lightheadedness with hypotension: Unknown Has patient had a PCN reaction causing severe rash involving mucus membranes or skin necrosis: Unknown Has patient had a PCN reaction that required hospitalization: Unknown Has patient had a PCN reaction occurring within the last 10 years: No If all of the above answers are "NO", then may proceed with Cephalosporin use.    Meds:  No prescriptions prior to admission.    ROS:  Review of Systems  Constitutional: Negative for chills, fatigue and fever.  Respiratory: Negative for shortness of breath.   Cardiovascular: Negative for chest pain.  Gastrointestinal: Positive for abdominal pain and nausea. Negative for vomiting.  Genitourinary: Positive for pelvic pain. Negative for difficulty urinating, dysuria, flank pain, vaginal bleeding, vaginal discharge and vaginal pain.  Neurological: Negative  for dizziness and headaches.  Psychiatric/Behavioral: Negative.      I have reviewed patient's Past Medical Hx, Surgical Hx, Family Hx, Social Hx, medications and allergies.   Physical Exam   Patient Vitals for the past 24 hrs:  BP Temp Temp src Pulse Resp  07/07/17 0827 108/64 - - 60 16  07/07/17 0550 109/64 98 F (36.7 C) Oral 80 20   Constitutional: Well-developed, well-nourished female in no acute distress.  Cardiovascular: normal rate Respiratory: normal effort GI: Abd soft, non-tender, gravid appropriate for gestational age.  MS: Extremities nontender, no edema, normal ROM Neurologic: Alert and oriented x 4.  GU: Neg CVAT.  PELVIC EXAM: Wet prep/GC collected by blind swab  Dilation:  Closed Effacement (%): Thick Cervical Position: Posterior Station: -3 Presentation: Undeterminable Exam by:: benji stanley RN  FHT:  Baseline 125 , moderate variability, accelerations present, no decelerations Contractions: None on toco or to palpation   Labs: Results for orders placed or performed during the hospital encounter of 07/07/17 (from the past 24 hour(s))  Urine rapid drug screen (hosp performed)     Status: Abnormal   Collection Time: 07/07/17  6:10 AM  Result Value Ref Range   Opiates POSITIVE (A) NONE DETECTED   Cocaine NONE DETECTED NONE DETECTED   Benzodiazepines POSITIVE (A) NONE DETECTED   Amphetamines NONE DETECTED NONE DETECTED   Tetrahydrocannabinol POSITIVE (A) NONE DETECTED   Barbiturates NONE DETECTED NONE DETECTED  Urinalysis, Routine w reflex microscopic     Status: Abnormal   Collection Time: 07/07/17  6:10 AM  Result Value Ref Range   Color, Urine YELLOW YELLOW   APPearance HAZY (A) CLEAR   Specific Gravity, Urine 1.016 1.005 - 1.030   pH 7.0 5.0 - 8.0   Glucose, UA NEGATIVE NEGATIVE mg/dL   Hgb urine dipstick NEGATIVE NEGATIVE   Bilirubin Urine NEGATIVE NEGATIVE   Ketones, ur 20 (A) NEGATIVE mg/dL   Protein, ur NEGATIVE NEGATIVE mg/dL   Nitrite NEGATIVE NEGATIVE   Leukocytes, UA NEGATIVE NEGATIVE  Wet prep, genital     Status: Abnormal   Collection Time: 07/07/17  6:45 AM  Result Value Ref Range   Yeast Wet Prep HPF POC NONE SEEN NONE SEEN   Trich, Wet Prep NONE SEEN NONE SEEN   Clue Cells Wet Prep HPF POC NONE SEEN NONE SEEN   WBC, Wet Prep HPF POC FEW (A) NONE SEEN   Sperm NONE SEEN    --/--/A POS (07/21 1612)  Imaging:  [place] Mfm Ob Follow Up  Result Date: 06/22/2017 ----------------------------------------------------------------------  OBSTETRICS REPORT                      (Signed Final 06/22/2017 02:59 pm) ---------------------------------------------------------------------- Patient Info  ID #:       [id number]                           D.O.B.:  11-15-1990 (26 yrs)  Name:       Mercedes [name]                   Visit Date: 06/22/2017 02:06 pm              [place] ---------------------------------------------------------------------- Performed By  Performed By:     Vivien Rota        Ref. Address:     [address]  [address]                                                             [place], [place]                                                             [address]  Attending:        Charlsie Merles MD         Secondary Phy.:   MAU Nursing-                                                             MAU/Triage  Referred By:      Montez Morita         Location:         [facility]                    CNM ---------------------------------------------------------------------- Orders   #  Description                                 Code   1  [place] MFM OB FOLLOW UP                         [phone]  ----------------------------------------------------------------------   #  Ordered By               Order #        Accession #    Episode #   1   Bing          [id number]      [id number]     [id number]  ---------------------------------------------------------------------- Indications   [redacted] weeks gestation of pregnancy                Z3A.33  ---------------------------------------------------------------------- OB History  Gravidity:    4         Term:   2        Prem:   0  Ectopic:      0        Living:  2 ---------------------------------------------------------------------- Fetal Evaluation  Num Of Fetuses:     1  Fetal Heart         137  Rate(bpm):  Cardiac Activity:   Observed  Presentation:       Cephalic  Placenta:           Posterior, above cervical os  P. Cord Insertion:  Previously Visualized  Amniotic Fluid  AFI FV:      Subjectively within normal limits  AFI Sum(cm)     %Tile       Largest Pocket(cm)  14.13           49          3.99  RUQ(cm)       RLQ(cm)        LUQ(cm)        LLQ(cm)  3.1           3.56          3.48           3.99 ---------------------------------------------------------------------- Biometry  BPD:      85.7  mm     G. Age:  34w 4d         68  %    CI:        75.78   %    70 - 86                                                          FL/HC:      20.4   %    19.4 - 21.8  HC:      312.1  mm     G. Age:  34w 6d         41  %    HC/AC:      1.05        0.96 - 1.11  AC:      296.8  mm     G. Age:  33w 5d         48  %    FL/BPD:     74.4   %    71 - 87  FL:       63.8  mm     G. Age:  33w 0d         19  %    FL/AC:      21.5   %    20 - 24  HUM:      55.3  mm     G. Age:  32w 1d         23  %  Est. FW:    2250  gm    4 lb 15 oz      55  % ---------------------------------------------------------------------- Gestational Age  LMP:           33w 6d        Date:  10/28/16                 EDD:   08/04/17  U/S Today:     34w 0d                                        EDD:   08/03/17  Best:          33w 6d     Det. By:  LMP  (10/28/16)          EDD:   08/04/17 ---------------------------------------------------------------------- Anatomy  Cranium:               Appears normal  Aortic Arch:            Appears normal  Cavum:                 Previously seen        Ductal Arch:            Appears normal  Ventricles:            Previously seen        Diaphragm:              Previously seen  Choroid Plexus:        Previously seen        Stomach:                Appears normal, left                                                                        sided  Cerebellum:            Previously seen        Abdomen:                Appears normal  Posterior Fossa:       Previously seen        Abdominal Wall:         Previously seen  Nuchal Fold:           Not applicable (>20    Cord Vessels:           Previously seen                         wks GA)  Face:                  Orbits and profile     Kidneys:                Appear normal                         previously  seen  Lips:                  Previously seen        Bladder:                Appears normal  Thoracic:              Appears normal         Spine:                  Previously seen  Heart:                 Appears normal         Upper Extremities:      Previously seen                         (4CH, axis, and situs  RVOT:                  Appears normal         Lower Extremities:      Previously seen  LVOT:                  Appears normal  Other:  Fetus appears to be a female. Nasal bone previously visualized.          Technically difficult due to fetal position. ---------------------------------------------------------------------- Cervix Uterus Adnexa  Cervix  Not visualized (advanced GA >29wks)  Uterus  No abnormality visualized.  Left Ovary  Not visualized.  Right Ovary  Not visualized.  Adnexa:       No abnormality visualized. No adnexal mass                visualized. ---------------------------------------------------------------------- Impression  Singleton intrauterine pregnancy at 28+1 with preterm  contractions  Review of the anatomy shows no sonographic markers for  aneuploidy or structural anomalies  All relevant anatomy has been visualized  Amniotic fluid volume is normal  Estimated fetal weight is 2250g which is growth in the 55th  percentile ---------------------------------------------------------------------- Recommendations  Follow-up ultrasounds as clinically indicated. ----------------------------------------------------------------------                 Charlsie Merles, MD Electronically Signed Final Report   06/22/2017 02:59 pm ----------------------------------------------------------------------  [place] Mfm Ob Limited  Result Date: 07/06/2017 ----------------------------------------------------------------------  OBSTETRICS REPORT                      (Signed Final 07/06/2017 11:51 am) ---------------------------------------------------------------------- Patient Info  ID #:       [id number]                           D.O.B.:  28-Oct-1990 (26 yrs)  Name:       Mercedes [name]                   Visit Date: 07/06/2017 07:39 am              [place] ---------------------------------------------------------------------- Performed By  Performed By:     Tommi Emery         Ref. Address:     [address]                                                             [place], [place]                                                             [address]  Attending:        Charlsie Merles MD         Secondary Phy.:   MAU Nursing-  MAU/Triage  Referred By:      Montez Morita         Location:         [facility]                    CNM ---------------------------------------------------------------------- Orders   #  Description                                 Code   1  [place] MFM OB LIMITED                           [phone]  ----------------------------------------------------------------------   #  Ordered By               Order #        Accession #    Episode #   1  Donette Larry          [id number]      [id number]     [id number]  ---------------------------------------------------------------------- Indications   [redacted] weeks gestation of pregnancy                Z3A.35   Abdominal pain in pregnancy                    O99.89  ---------------------------------------------------------------------- OB History  Gravidity:    4         Term:   2        Prem:   0  Ectopic:      0        Living:  2 ---------------------------------------------------------------------- Fetal Evaluation  Num Of Fetuses:     1  Fetal Heart         130  Rate(bpm):  Cardiac Activity:   Observed  Presentation:       Cephalic  Placenta:           Posterior, above cervical os  P. Cord Insertion:  Previously Visualized  Amniotic Fluid  AFI FV:      Subjectively within normal limits  AFI Sum(cm)     %Tile       Largest  Pocket(cm)  17.39           64          5.26  RUQ(cm)       RLQ(cm)       LUQ(cm)        LLQ(cm)  3.42          3.98          5.26           4.73 ---------------------------------------------------------------------- Gestational Age  LMP:           35w 6d        Date:  10/28/16                 EDD:   08/04/17  Best:          Consuello Closs 6d     Det. By:  LMP  (10/28/16)          EDD:   08/04/17 ---------------------------------------------------------------------- Cervix Uterus Adnexa  Cervix  Not visualized (advanced GA >29wks)  Uterus  No abnormality visualized.  Left Ovary  Not visualized. No adnexal mass visualized.  Right Ovary  Not visualized. No adnexal mass visualized.  Cul De Sac:   No free fluid  seen.  Adnexa:       No abnormality visualized. ---------------------------------------------------------------------- Impression  IUP at 35+6 weeks with abdominal pain  normal fetal movement and cardiac activity  Normal placentation  Normal amniotic fluid  Cephalic presentation ---------------------------------------------------------------------- Recommendations  Continue clinical evaluation and management ----------------------------------------------------------------------                 Charlsie Merles, MD Electronically Signed Final Report   07/06/2017 11:51 am ----------------------------------------------------------------------   MAU Course/MDM: I have ordered labs and reviewed results.  NST reviewed and reactive Cervix unchanged in 1 hour in MAU so no evidence of labor Wet prep/GC collected Consult Ervin with presentation, exam findings and test results.  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  Will treat nausea and pain. Pt to continue treatment as prescribed at home and discontinue marijuana use. Give IM Demerol and Vistaril in MAU D/C home Pt to pick up Zofran, Bentyl, and Dilaudid PO prescriptions and take as  directed F/U in office as scheduled Return to MAU as needed for emergencies Pt stable at time of discharge.   Assessment: 1. Abdominal pain during pregnancy, third trimester   2. False labor   3. Pelvic pressure in pregnancy, antepartum, third trimester   4. Cannabis hyperemesis syndrome concurrent with and due to cannabis abuse [facility])     Plan: Discharge home Labor precautions and fetal kick counts  Follow-up Information    [facility].   Specialty:  Obstetrics and Gynecology Why:  As scheduled, return to MAU as needed for emergencies Contact information: [address] [place] [address] [phone]         Allergies as of 07/07/2017      Reactions   Milk-related Compounds Other (See Comments)   Reaction:  Unknown    Penicillins Other (See Comments)   Reaction:  Unknown  Has patient had a PCN reaction causing immediate rash, facial/tongue/throat swelling, SOB or lightheadedness with hypotension: Unknown Has patient had a PCN reaction causing severe rash involving mucus membranes or skin necrosis: Unknown Has patient had a PCN reaction that required hospitalization: Unknown Has patient had a PCN reaction occurring within the last 10 years: No If all of the above answers are "NO", then may proceed with Cephalosporin use.      Medication List    TAKE these medications   acetaminophen 500 MG tablet Commonly known as:  TYLENOL Take 500 mg by mouth every 6 (six) hours as needed for moderate pain.   albuterol 108 (90 Base) MCG/ACT inhaler Commonly known as:  PROVENTIL HFA;VENTOLIN HFA Inhale 2 puffs into the lungs every 6 (six) hours as needed for wheezing or shortness of breath.   albuterol (2.5 MG/3ML) 0.083% nebulizer solution Commonly known as:  PROVENTIL Take 3 mLs (2.5 mg total) by nebulization every 6 (six) hours as needed for wheezing or shortness of breath.   COMFORT FIT MATERNITY SUPP LG Misc 1  application by Does not apply route daily.   dicyclomine 20 MG tablet Commonly known as:  BENTYL Take 1 tablet (20 mg total) by mouth 3 (three) times daily as needed for spasms.   Doxylamine-Pyridoxine ER 20-20 MG Tbcr Commonly known as:  BONJESTA Take 1 tablet by mouth at bedtime. Take an additional 1 tablet with breakfast if s/s persist   fluticasone 110 MCG/ACT inhaler Commonly known as:  FLOVENT HFA Inhale 2 puffs into the lungs 2 (two) times daily.  HYDROmorphone 2 MG tablet Commonly known as:  DILAUDID Take 1 tablet (2 mg total) by mouth every 6 (six) hours as needed for severe pain.   metoCLOPramide 10 MG tablet Commonly known as:  REGLAN Take 1 tablet (10 mg total) by mouth every 6 (six) hours as needed for nausea (nausea/headache).   ondansetron 8 MG disintegrating tablet Commonly known as:  ZOFRAN ODT Take 1 tablet (8 mg total) by mouth every 8 (eight) hours as needed for nausea or vomiting.   potassium chloride SA 20 MEQ tablet Commonly known as:  K-DUR,KLOR-CON Take 2 tablets (40 mEq total) by mouth daily.   prenatal multivitamin Tabs tablet Take 1 tablet by mouth daily at 12 noon.   prochlorperazine 10 MG tablet Commonly known as:  COMPAZINE Take 10 mg by mouth every 6 (six) hours as needed for nausea or vomiting.   ranitidine 150 MG capsule Commonly known as:  ZANTAC Take 1 capsule (150 mg total) by mouth 2 (two) times daily.            Discharge Care Instructions        Start     Ordered   07/07/17 0000  Discharge patient    Question Answer Comment  Discharge disposition 01-Home or Self Care   Discharge patient date 07/07/2017      07/07/17 0736      Sharen Counter Certified Nurse-Midwife 07/07/2017 9:12 AM

## 2017-07-07 NOTE — Progress Notes (Signed)
Pt screaming every few minutes since arrived.  Reports contraction pains and vaginal pains. Fundus palpates soft. Collene Gobble - Craige Cotta CNM notified, to bedside. Emotional support given every few minutes. Pt sometimes quiet with eyes closed and then screaming again.

## 2017-07-07 NOTE — MAU Note (Signed)
Received from EMS. Can't tell how far apart contractions, started at 1 am. No bleeding . No leaking. Baby not really moving.

## 2017-07-08 ENCOUNTER — Inpatient Hospital Stay (HOSPITAL_COMMUNITY)
Admission: AD | Admit: 2017-07-08 | Discharge: 2017-07-08 | Disposition: A | Discharge status: Home / Self Care | Payer: Medicaid Other | Source: Ambulatory Visit | Attending: Obstetrics and Gynecology | Admitting: Obstetrics and Gynecology

## 2017-07-08 ENCOUNTER — Encounter (HOSPITAL_COMMUNITY): Payer: Self-pay | Admitting: *Deleted

## 2017-07-08 DIAGNOSIS — O4703 False labor before 37 completed weeks of gestation, third trimester: Secondary | ICD-10-CM | POA: Insufficient documentation

## 2017-07-08 DIAGNOSIS — O479 False labor, unspecified: Secondary | ICD-10-CM

## 2017-07-08 DIAGNOSIS — E876 Hypokalemia: Secondary | ICD-10-CM | POA: Insufficient documentation

## 2017-07-08 DIAGNOSIS — Z3A36 36 weeks gestation of pregnancy: Secondary | ICD-10-CM | POA: Diagnosis not present

## 2017-07-08 DIAGNOSIS — R109 Unspecified abdominal pain: Secondary | ICD-10-CM | POA: Diagnosis present

## 2017-07-08 DIAGNOSIS — J45909 Unspecified asthma, uncomplicated: Secondary | ICD-10-CM | POA: Insufficient documentation

## 2017-07-08 DIAGNOSIS — F12188 Cannabis abuse with other cannabis-induced disorder: Secondary | ICD-10-CM

## 2017-07-08 DIAGNOSIS — O99013 Anemia complicating pregnancy, third trimester: Secondary | ICD-10-CM | POA: Diagnosis not present

## 2017-07-08 DIAGNOSIS — O211 Hyperemesis gravidarum with metabolic disturbance: Secondary | ICD-10-CM | POA: Diagnosis not present

## 2017-07-08 DIAGNOSIS — O99513 Diseases of the respiratory system complicating pregnancy, third trimester: Secondary | ICD-10-CM | POA: Diagnosis not present

## 2017-07-08 DIAGNOSIS — O26893 Other specified pregnancy related conditions, third trimester: Secondary | ICD-10-CM | POA: Diagnosis present

## 2017-07-08 DIAGNOSIS — R1084 Generalized abdominal pain: Secondary | ICD-10-CM

## 2017-07-08 LAB — URINALYSIS, DIPSTICK ONLY
Bilirubin Urine: NEGATIVE
GLUCOSE, UA: NEGATIVE mg/dL
NITRITE: NEGATIVE
PROTEIN: 30 mg/dL — AB
Specific Gravity, Urine: >1.03 — ABNORMAL HIGH (ref 1.005–1.030)
pH: 6 (ref 5.0–8.0)

## 2017-07-08 MED ORDER — HYDROMORPHONE HCL 1 MG/ML IJ SOLN
1.0000 mg | Freq: Once | INTRAMUSCULAR | Status: AC
  Administered 2017-07-08: 1 mg via INTRAVENOUS
  Filled 2017-07-08: qty 1

## 2017-07-08 MED ORDER — ONDANSETRON HCL 4 MG/2ML IJ SOLN
4.0000 mg | Freq: Once | INTRAMUSCULAR | Status: AC
  Administered 2017-07-08: 4 mg via INTRAVENOUS
  Filled 2017-07-08: qty 2

## 2017-07-08 MED ORDER — LACTATED RINGERS IV BOLUS (SEPSIS)
1000.0000 mL | Freq: Once | INTRAVENOUS | Status: AC
  Administered 2017-07-08: 1000 mL via INTRAVENOUS

## 2017-07-08 NOTE — MAU Provider Note (Signed)
History     CSN: [id number]  Arrival date and time: 07/08/17 0340   First Provider Initiated Contact with Patient 07/08/17 0522      Chief Complaint  Patient presents with  . Abdominal Pain   HPI [name] is a 26 y.o. [phone] at [redacted]w[redacted]d who presents to MAU today with complaint of contractions. The patient states continued N/V since earlier today when she was last evaluated in MAU. She states continued abdominal pain constantly as well as contractions. She denies vaginal bleeding, LOF at this time. She reports normal fetal movement.   OB History    Gravida Para Term Preterm AB Living   0 1 2   SAB TAB Ectopic Multiple Live Births   0 0 0 0 2      Obstetric Comments   G1: 2014 3430gm SVD G2: 01/2014 3195gm SVD/2nd      Past Medical History:  Diagnosis Date  . Anemia   . Asthma   . Hypokalemia   . Kidney stone 2018    Past Surgical History:  Procedure Laterality Date  . NO PAST SURGERIES    . WISDOM TOOTH EXTRACTION      Family History  Problem Relation Age of Onset  . Cancer Mother        ovarian  . Asthma Mother     Social History  Substance Use Topics  . Smoking status: Former Smoker    Types: Cigarettes  . Smokeless tobacco: Never Used     Comment: with +preg test  . Alcohol use No    Allergies:  Allergies  Allergen Reactions  . Milk-Related Compounds Other (See Comments)    Reaction:  Unknown   . Penicillins Other (See Comments)    Reaction:  Unknown  Has patient had a PCN reaction causing immediate rash, facial/tongue/throat swelling, SOB or lightheadedness with hypotension: Unknown Has patient had a PCN reaction causing severe rash involving mucus membranes or skin necrosis: Unknown Has patient had a PCN reaction that required hospitalization: Unknown Has patient had a PCN reaction occurring within the last 10 years: No If all of the above answers are "NO", then may proceed with Cephalosporin use.    Prescriptions Prior to  Admission  Medication Sig Dispense Refill Last Dose  . albuterol (PROVENTIL) (2.5 MG/3ML) 0.083% nebulizer solution Take 3 mLs (2.5 mg total) by nebulization every 6 (six) hours as needed for wheezing or shortness of breath. 75 mL 0 Past Week at Unknown time  . dicyclomine (BENTYL) 20 MG tablet Take 1 tablet (20 mg total) by mouth 3 (three) times daily as needed for spasms. 20 tablet 0 07/07/2017 at Unknown time  . fluticasone (FLOVENT HFA) 110 MCG/ACT inhaler Inhale 2 puffs into the lungs 2 (two) times daily. 1 Inhaler 3 Past Week at Unknown time  . HYDROmorphone (DILAUDID) 2 MG tablet Take 1 tablet (2 mg total) by mouth every 6 (six) hours as needed for severe pain. 10 tablet 0 07/07/2017 at Unknown time  . metoCLOPramide (REGLAN) 10 MG tablet Take 1 tablet (10 mg total) by mouth every 6 (six) hours as needed for nausea (nausea/headache). 30 tablet 2 07/07/2017 at Unknown time  . ondansetron (ZOFRAN ODT) 8 MG disintegrating tablet Take 1 tablet (8 mg total) by mouth every 8 (eight) hours as needed for nausea or vomiting. 20 tablet 0 07/07/2017 at Unknown time  . Prenatal Vit-Fe Fumarate-FA (PRENATAL MULTIVITAMIN) TABS tablet Take 1 tablet by mouth daily at 12 noon.  07/07/2017 at Unknown time  . prochlorperazine (COMPAZINE) 10 MG tablet Take 10 mg by mouth every 6 (six) hours as needed for nausea or vomiting.   07/07/2017 at Unknown time  . acetaminophen (TYLENOL) 500 MG tablet Take 500 mg by mouth every 6 (six) hours as needed for moderate pain.   Past Month at Unknown time  . albuterol (PROVENTIL HFA;VENTOLIN HFA) 108 (90 Base) MCG/ACT inhaler Inhale 2 puffs into the lungs every 6 (six) hours as needed for wheezing or shortness of breath.   Past Month at Unknown time  . Doxylamine-Pyridoxine ER (BONJESTA) 20-20 MG TBCR Take 1 tablet by mouth at bedtime. Take an additional 1 tablet with breakfast if s/s persist 60 tablet 1 Past Week at Unknown time  . Elastic Bandages & Supports (COMFORT FIT MATERNITY  SUPP LG) MISC 1 application by Does not apply route daily. 1 each 0   . potassium chloride SA (K-DUR,KLOR-CON) 20 MEQ tablet Take 2 tablets (40 mEq total) by mouth daily. 6 tablet 0 NONE  . ranitidine (ZANTAC) 150 MG capsule Take 1 capsule (150 mg total) by mouth 2 (two) times daily. 60 capsule 2 NONE    Review of Systems  Constitutional: Negative for fever.  Gastrointestinal: Positive for abdominal pain, nausea and vomiting. Negative for constipation and diarrhea.  Genitourinary: Negative for vaginal bleeding and vaginal discharge.   Physical Exam   Blood pressure 106/69, pulse 73, temperature 98.1 F (36.7 C), temperature source Oral, resp. rate 20, last menstrual period 10/28/2016.  Physical Exam  Nursing note and vitals reviewed. Constitutional: She is oriented to person, place, and time. She appears well-developed and well-nourished. No distress.  HENT:  Head: Normocephalic and atraumatic.  Cardiovascular: Normal rate.   Respiratory: Effort normal.  GI: Soft. She exhibits no distension and no mass. There is no tenderness. There is no rebound and no guarding.  Neurological: She is alert and oriented to person, place, and time.  Skin: Skin is warm and dry. No erythema.  Psychiatric: She has a normal mood and affect.  Dilation: 2 Effacement (%): Thick Cervical Position: Posterior Station: Ballotable Presentation: Vertex   Results for orders placed or performed during the hospital encounter of 07/08/17 (from the past 24 hour(s))  Urinalysis, dipstick only     Status: Abnormal   Collection Time: 07/08/17  4:55 AM  Result Value Ref Range   Color, Urine YELLOW YELLOW   APPearance CLEAR CLEAR   Specific Gravity, Urine >1.030 (H) 1.005 - 1.030   pH 6.0 5.0 - 8.0   Glucose, UA NEGATIVE NEGATIVE mg/dL   Hgb urine dipstick TRACE (A) NEGATIVE   Bilirubin Urine NEGATIVE NEGATIVE   Ketones, ur >80 (A) NEGATIVE mg/dL   Protein, ur 30 (A) NEGATIVE mg/dL   Nitrite NEGATIVE NEGATIVE    Leukocytes, UA TRACE (A) NEGATIVE    MAU Course  Procedures None  MDM Reviewed notes from recent visits Cervix is unchanged from last visit in MAU  UA today shows significant dehydration  IV LR with IV Zofran and Dilaudid given  Patient has been able to tolerate PO Requesting something else for pain when woken up for re-assessment 1 mg Dilaudid IV given with second liter IV fluids  Assessment and Plan  A: [facility] at [redacted]w[redacted]d Abdominal pain Cannabis hyperemesis  Braxton hicks contractions  P:  Discharge home in stable condition Advised to continue Rx as previously prescribed Preterm labor precautions discussed Patient advised to follow-up with [facility] for routine prenatal care. Message sent to [facility]  pool to schedule and call the patient Patient may return to MAU as needed or if her condition were to change or worsen   Vonzella Nipple, PA-C 07/08/2017, 8:07 AM

## 2017-07-08 NOTE — Discharge Instructions (Signed)
Fetal Movement Counts °Patient Name: ________________________________________________ Patient Due Date: ____________________ °What is a fetal movement count? °A fetal movement count is the number of times that you feel your baby move during a certain amount of time. This may also be called a fetal kick count. A fetal movement count is recommended for every pregnant woman. You may be asked to start counting fetal movements as early as week 28 of your pregnancy. °Pay attention to when your baby is most active. You may notice your baby's sleep and wake cycles. You may also notice things that make your baby move more. You should do a fetal movement count: °· When your baby is normally most active. °· At the same time each day. ° °A good time to count movements is while you are resting, after having something to eat and drink. °How do I count fetal movements? °1. Find a quiet, comfortable area. Sit, or lie down on your side. °2. Write down the date, the start time and stop time, and the number of movements that you felt between those two times. Take this information with you to your health care visits. °3. For 2 hours, count kicks, flutters, swishes, rolls, and jabs. You should feel at least 10 movements during 2 hours. °4. You may stop counting after you have felt 10 movements. °5. If you do not feel 10 movements in 2 hours, have something to eat and drink. Then, keep resting and counting for 1 hour. If you feel at least 4 movements during that hour, you may stop counting. °Contact a health care provider if: °· You feel fewer than 4 movements in 2 hours. °· Your baby is not moving like he or she usually does. °Date: ____________ Start time: ____________ Stop time: ____________ Movements: ____________ °Date: ____________ Start time: ____________ Stop time: ____________ Movements: ____________ °Date: ____________ Start time: ____________ Stop time: ____________ Movements: ____________ °Date: ____________ Start time:  ____________ Stop time: ____________ Movements: ____________ °Date: ____________ Start time: ____________ Stop time: ____________ Movements: ____________ °Date: ____________ Start time: ____________ Stop time: ____________ Movements: ____________ °Date: ____________ Start time: ____________ Stop time: ____________ Movements: ____________ °Date: ____________ Start time: ____________ Stop time: ____________ Movements: ____________ °Date: ____________ Start time: ____________ Stop time: ____________ Movements: ____________ °This information is not intended to replace advice given to you by your health care provider. Make sure you discuss any questions you have with your health care provider. °Document Released: 11/09/2006 Document Revised: 06/08/2016 Document Reviewed: 11/19/2015 °Elsevier Interactive Patient Education © 2018 [employer] °Braxton Hicks Contractions °Contractions of the uterus can occur throughout pregnancy, but they are not always a sign that you are in labor. You may have practice contractions called Braxton Hicks contractions. These false labor contractions are sometimes confused with true labor. °What are Braxton Hicks contractions? °Braxton Hicks contractions are tightening movements that occur in the muscles of the uterus before labor. Unlike true labor contractions, these contractions do not result in opening (dilation) and thinning of the cervix. Toward the end of pregnancy (32-34 weeks), Braxton Hicks contractions can happen more often and may become stronger. These contractions are sometimes difficult to tell apart from true labor because they can be very uncomfortable. You should not feel embarrassed if you go to the hospital with false labor. °Sometimes, the only way to tell if you are in true labor is for your health care provider to look for changes in the cervix. The health care provider will do a physical exam and may monitor your contractions. If   you are not in true labor, the exam  should show that your cervix is not dilating and your water has not broken. °If there are no prenatal problems or other health problems associated with your pregnancy, it is completely safe for you to be sent home with false labor. You may continue to have Braxton Hicks contractions until you go into true labor. °How can I tell the difference between true labor and false labor? °· Differences °? False labor °? Contractions last 30-70 seconds.: Contractions are usually shorter and not as strong as true labor contractions. °? Contractions become very regular.: Contractions are usually irregular. °? Discomfort is usually felt in the top of the uterus, and it spreads to the lower abdomen and low back.: Contractions are often felt in the front of the lower abdomen and in the groin. °? Contractions do not go away with walking.: Contractions may go away when you walk around or change positions while lying down. °? Contractions usually become more intense and increase in frequency.: Contractions get weaker and are shorter-lasting as time goes on. °? The cervix dilates and gets thinner.: The cervix usually does not dilate or become thin. °Follow these instructions at home: °· Take over-the-counter and prescription medicines only as told by your health care provider. °· Keep up with your usual exercises and follow other instructions from your health care provider. °· Eat and drink lightly if you think you are going into labor. °· If Braxton Hicks contractions are making you uncomfortable: °? Change your position from lying down or resting to walking, or change from walking to resting. °? Sit and rest in a tub of warm water. °? Drink enough fluid to keep your urine clear or pale yellow. Dehydration may cause these contractions. °? Do slow and deep breathing several times an hour. °· Keep all follow-up prenatal visits as told by your health care provider. This is important. °Contact a health care provider if: °· You have a  fever. °· You have continuous pain in your abdomen. °Get help right away if: °· Your contractions become stronger, more regular, and closer together. °· You have fluid leaking or gushing from your vagina. °· You pass blood-tinged mucus (bloody show). °· You have bleeding from your vagina. °· You have low back pain that you never had before. °· You feel your baby’s head pushing down and causing pelvic pressure. °· Your baby is not moving inside you as much as it used to. °Summary °· Contractions that occur before labor are called Braxton Hicks contractions, false labor, or practice contractions. °· Braxton Hicks contractions are usually shorter, weaker, farther apart, and less regular than true labor contractions. True labor contractions usually become progressively stronger and regular and they become more frequent. °· Manage discomfort from Braxton Hicks contractions by changing position, resting in a warm bath, drinking plenty of water, or practicing deep breathing. °This information is not intended to replace advice given to you by your health care provider. Make sure you discuss any questions you have with your health care provider. °Document Released: 10/10/2005 Document Revised: 08/29/2016 Document Reviewed: 08/29/2016 °Elsevier Interactive Patient Education © 2017 Elsevier Inc. ° °

## 2017-07-08 NOTE — Progress Notes (Signed)
Assist pt on bed pan to obtain U/A. Pt stated she could not void at this time. She asked if the nurse has spoken with the doctor yet. Told pt a U/A is needed to have a good assessment and lab values to discuss with provider in order to come up with a plan. Pt then began to yell very loudly, "where is the doctor! I need an IV! My stomach hurts! Pt unable to void on bedpan x 2 attempts. UA collected via I&O cath.

## 2017-07-08 NOTE — MAU Note (Addendum)
PT ARRIVED VIA EMS   PT SAYS SHE WAS HERE THIS AM - LEFT AMA   THEN RETURNED  AND  WAS D/C  AT 4 PM.    SAYS  HAS BEEN VOMITING- LAST TIME AT 2 PM.     DENIES  ANY DRUGS , ETOH OR MEDS TONIGHT .   PT HOLLERS  OCC  EVEN WHEN NOT HAVING  UC.

## 2017-07-09 ENCOUNTER — Inpatient Hospital Stay (HOSPITAL_COMMUNITY)
Admission: AD | Admit: 2017-07-09 | Discharge: 2017-07-09 | Disposition: A | Discharge status: Home / Self Care | Payer: Medicaid Other | Source: Ambulatory Visit | Attending: Obstetrics & Gynecology | Admitting: Obstetrics & Gynecology

## 2017-07-09 ENCOUNTER — Encounter (HOSPITAL_COMMUNITY): Payer: Self-pay | Admitting: *Deleted

## 2017-07-09 DIAGNOSIS — O4703 False labor before 37 completed weeks of gestation, third trimester: Secondary | ICD-10-CM | POA: Diagnosis not present

## 2017-07-09 DIAGNOSIS — O99323 Drug use complicating pregnancy, third trimester: Secondary | ICD-10-CM | POA: Insufficient documentation

## 2017-07-09 DIAGNOSIS — F12188 Cannabis abuse with other cannabis-induced disorder: Secondary | ICD-10-CM

## 2017-07-09 DIAGNOSIS — Z3A36 36 weeks gestation of pregnancy: Secondary | ICD-10-CM | POA: Diagnosis not present

## 2017-07-09 DIAGNOSIS — R109 Unspecified abdominal pain: Secondary | ICD-10-CM | POA: Insufficient documentation

## 2017-07-09 DIAGNOSIS — O26893 Other specified pregnancy related conditions, third trimester: Secondary | ICD-10-CM | POA: Diagnosis not present

## 2017-07-09 DIAGNOSIS — F1210 Cannabis abuse, uncomplicated: Secondary | ICD-10-CM | POA: Diagnosis not present

## 2017-07-09 LAB — URINALYSIS, ROUTINE W REFLEX MICROSCOPIC
BACTERIA UA: NONE SEEN
Bilirubin Urine: NEGATIVE
Glucose, UA: NEGATIVE mg/dL
Ketones, ur: 80 mg/dL — AB
Leukocytes, UA: NEGATIVE
Nitrite: NEGATIVE
PROTEIN: 30 mg/dL — AB
Specific Gravity, Urine: 1.025 (ref 1.005–1.030)
pH: 6 (ref 5.0–8.0)

## 2017-07-09 MED ORDER — ONDANSETRON 8 MG PO TBDP
8.0000 mg | ORAL_TABLET | Freq: Once | ORAL | Status: AC
  Administered 2017-07-09: 8 mg via ORAL
  Filled 2017-07-09: qty 1

## 2017-07-09 MED ORDER — M.V.I. ADULT IV INJ
Freq: Once | INTRAVENOUS | Status: AC
  Administered 2017-07-09: 12:00:00 via INTRAVENOUS
  Filled 2017-07-09: qty 10

## 2017-07-09 MED ORDER — PROMETHAZINE HCL 25 MG/ML IJ SOLN
25.0000 mg | Freq: Once | INTRAVENOUS | Status: AC
  Administered 2017-07-09: 25 mg via INTRAVENOUS
  Filled 2017-07-09: qty 1

## 2017-07-09 MED ORDER — HYDROMORPHONE HCL 2 MG PO TABS: 2.0000 mg | ORAL_TABLET | Freq: Four times a day (QID) | ORAL | 0 refills | Status: DC | PRN

## 2017-07-09 NOTE — MAU Note (Signed)
Contractions started at 0200. No bleeding or leaking.  Feeling pressure.  States hasn't been able to keep anything down in 2 days. Asking for an IV

## 2017-07-09 NOTE — Progress Notes (Addendum)
Assumed care of pt. Instructed pt that we need urine sample. While Pt sucking on her thumb, states wants water to drink in order to provide [place] with urine specimen. Two big cups of water provided  1037: pt sleeping with eyes closes and snoring. Woke pt up to obtain urine. States cannot go.   1041: Orders received to cath pt since pt do not want to go use bathroom. Informed pt that we will be catherizing her. Pt okayed with procedure.   1045: pt cathed and specimen to lab.   1110: informed pt will be discharging her home. Pt wants another bag of fluid and speak to provider. Provider notified. Ordered to have pt get another bag of fluid due to 80 ketones in urine. Multivit bag LR ordered by provider.   1154: received multivit bag from pharmacy via tube system. 1155: Multivit bag up per order.   1345: provider made aware of pt wanting pain meds.   1400: Hospital phone provided. IV removed. Instructed pt to get dressed for discharge.  1410: provider made aware of pt still wanting pain meds. Discharge orders received with pain med px. Informed pt needs to pick up med at pharmacy, pick it up and take it. Pt states cannot afford it. She had been given $$ for the medication this week and back for same issue and for the same medication. Social service paged.   1420: Social service called. Report status of pt given. Informed there's nothing more can be done in obtaining pt to get her narcotics. Social service is well aware of pt's status and frequent visits to MAU for same issues.   1424: Discharge instructions given with pt understanding. Px and discharge paper work given.  1430: pt left unit via ambulatory awaiting for ride in the lobby

## 2017-07-09 NOTE — MAU Provider Note (Signed)
CC:  Chief Complaint  Patient presents with  . Contractions     First Provider Initiated Contact with Patient 07/09/17 [phone]      HPI: Mercedes [name] is a 26 y.o. year old [id number] female at [redacted]w[redacted]d weeks gestation who presents to MAU reporting contractions, but unable to describe frequency) and ongoing N/V. States she has been Rx' d Zofran and Dilaudid. Asking for pain meds. Thinks the baby is coming.   Associated Sx: pos for N/V, Neg for fever, diarrhea Vaginal bleeding: Denies Leaking of fluid: Denies Fetal movement: Nml  O:  Patient Vitals for the past 24 hrs:  BP Temp Temp src Pulse Resp SpO2  07/09/17 0713 (!) 98/52 98.7 F (37.1 C) Oral 69 20 100 %    General: NAD Heart: Regular rate., 60-160's w/ crying Lungs: Normal rate and effort Abd: Soft, NT, Gravid, S=D Pelvic:  Dilation: 1 Effacement (%): Thick Cervical Position: Posterior, Middle Station: Ballotable Exam by:: [email or web address]  EFM: 135, Moderate variability, 15 x 15 accelerations, no decelerations Toco: Contractions none traced or palpated. Abd palpates soft.   Orders Placed This Encounter  Procedures  . Urinalysis, Routine w reflex microscopic  . Insert peripheral IV   Meds ordered this encounter  Medications  . promethazine (PHENERGAN) 25 mg in lactated ringers 1,000 mL infusion    [place], [place], CNM 07/09/2017 7:54 AM  Care of pt turned over to [facility], CNM, at 0800.  MDM: Pt with 14 MAU visits since July with similar complaints.  Pt reports the only thing that really hellps her is taking a hot shower.  She took Dilaudid PO as prescribed to her, last dose last night and that helps some.  She denies recent marijuana use.  Pt is well appearing after IV fluids and Phenergan given but requests pain medication.  Cervix remains 1/long/posterior after 2+ hours in MAU.  No Ctx on toco.  Abdomen soft x several minutes for CNM despite pt pain. No abdominal tenderness with palpation and no tenderness  during cervical exam.  Attempted to draw labs for CBC, CMP, lipase but lab unable to draw x 2 attempts. Consult Dr Erin Fulling with assessment and findings. Rx for Dilaudid 2 mg Q 6 hours PRN x 10 tabs renewed. Preterm labor precautions reviewed. D/C home in stable condition, f/u in office as scheduled return to MAU as needed for emergencies.  A: 1. 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 Abdominal pain during pregnancy in third trimester   3. Threatened preterm labor, third trimester     P: D/C home with labor precautions  Sharen Counter, CNM 11:19 AM

## 2017-07-09 NOTE — MAU Note (Signed)
Lab unable to get blood work, 2 different people tried.

## 2017-07-10 ENCOUNTER — Telehealth: Payer: Self-pay | Admitting: General Practice

## 2017-07-10 ENCOUNTER — Encounter: Payer: Self-pay | Admitting: General Practice

## 2017-07-10 LAB — GC/CHLAMYDIA PROBE AMP (~~LOC~~) NOT AT ARMC
CHLAMYDIA, DNA PROBE: NEGATIVE
Neisseria Gonorrhea: NEGATIVE

## 2017-07-10 NOTE — Telephone Encounter (Signed)
Patient has missed several appointments.  Called and notified patient of follow OB appointment on 07/13/17 at 9:00am.  Patient verbalized understanding.

## 2017-07-13 ENCOUNTER — Encounter: Payer: Self-pay | Admitting: *Deleted

## 2017-07-13 ENCOUNTER — Encounter: Payer: Medicaid Other | Admitting: Advanced Practice Midwife

## 2017-07-13 ENCOUNTER — Telehealth: Payer: Self-pay | Admitting: *Deleted

## 2017-07-13 NOTE — Telephone Encounter (Signed)
-----   Message from Marti Sleigh, [place] sent at 07/10/2017  3:00 PM EDT ----- Contact: [phone] Patient has an dentist appointment on Monday, 07/17/17 to have teeth extracted.  She's wanting to know if she could be put to sleep for this procedure.

## 2017-07-13 NOTE — Telephone Encounter (Signed)
I called Mercedes [name] back and we discussed is up to dentist but our providers can give her a letter that states she can have usual local anesthethic novacaine and antibiotics / analgesics as usual.  So we discussed no she will probably not be put to sleep because all we approve is local anesthesia. She voices understanding and states she already has the dental letter.

## 2017-07-19 ENCOUNTER — Ambulatory Visit (INDEPENDENT_AMBULATORY_CARE_PROVIDER_SITE_OTHER): Payer: Medicaid Other | Admitting: Medical

## 2017-07-19 VITALS — BP 111/57 | HR 73 | Wt 193.5 lb

## 2017-07-19 DIAGNOSIS — O0990 Supervision of high risk pregnancy, unspecified, unspecified trimester: Secondary | ICD-10-CM

## 2017-07-19 DIAGNOSIS — O0993 Supervision of high risk pregnancy, unspecified, third trimester: Secondary | ICD-10-CM | POA: Diagnosis present

## 2017-07-19 LAB — OB RESULTS CONSOLE GBS: STREP GROUP B AG: NEGATIVE

## 2017-07-19 NOTE — Patient Instructions (Signed)

## 2017-07-19 NOTE — Progress Notes (Signed)
   PRENATAL VISIT NOTE  Subjective:  Mercedes [name] is a 26 y.o. [phone] at [redacted]w[redacted]d being seen today for ongoing prenatal care.  She is currently monitored for the following issues for this high-risk pregnancy and has Anemia in pregnancy; Asthma affecting pregnancy, antepartum; Supervision of high risk pregnancy, antepartum; Insufficient prenatal care in third trimester; Substance abuse affecting pregnancy in third trimester, antepartum; Obesity in pregnancy with antepartum complication; Obesity (BMI 30-39.9); and Nausea and vomiting of pregnancy, antepartum on her problem list.  Patient wants to know when she can be induced.  Patient reports no complaints.  Contractions: Not present. Vag. Bleeding: None.  Movement: Present. Denies leaking of fluid.   The following portions of the patient's history were reviewed and updated as appropriate: allergies, current medications, past family history, past medical history, past social history, past surgical history and problem list. Problem list updated.  Objective:   Vitals:   07/19/17 1549  BP: (!) 111/57  Pulse: 73  Weight: 193 lb 8 oz (87.8 kg)    Fetal Status: Fetal Heart Rate (bpm): 163 Fundal Height: 38 cm Movement: Present  Presentation: Vertex  General:  Alert, oriented and cooperative. Patient is in no acute distress.  Skin: Skin is warm and dry. No rash noted.   Cardiovascular: Normal heart rate noted  Respiratory: Normal respiratory effort, no problems with respiration noted  Abdomen: Soft, gravid, appropriate for gestational age.  Pain/Pressure: Absent     Pelvic: Cervical exam deferred        Extremities: Normal range of motion.  Edema: None  Mental Status:  Normal mood and affect. Normal behavior. Normal judgment and thought content.   Assessment and Plan:  Pregnancy: [id number] at [redacted]w[redacted]d  1. Supervision of high risk pregnancy, antepartum - discussed with patient that she is not a candidate for induction because she does not  have medical indication-patient understands this - Culture, beta strep (group b only) - declines flu  Term labor symptoms and general obstetric precautions including but not limited to vaginal bleeding, contractions, leaking of fluid and fetal movement were reviewed in detail with the patient. Please refer to After Visit Summary for other counseling recommendations.  Return in about 1 week (around 07/26/2017).   Rolm Bookbinder, DO

## 2017-07-23 LAB — CULTURE, BETA STREP (GROUP B ONLY): STREP GP B CULTURE: NEGATIVE

## 2017-08-05 ENCOUNTER — Encounter (HOSPITAL_COMMUNITY): Payer: Self-pay

## 2017-08-05 ENCOUNTER — Inpatient Hospital Stay (HOSPITAL_COMMUNITY)
Admission: AD | Admit: 2017-08-05 | Discharge: 2017-08-05 | Disposition: A | Discharge status: Home / Self Care | Payer: Medicaid Other | Source: Ambulatory Visit | Attending: Obstetrics and Gynecology | Admitting: Obstetrics and Gynecology

## 2017-08-05 DIAGNOSIS — O479 False labor, unspecified: Secondary | ICD-10-CM

## 2017-08-05 NOTE — Discharge Instructions (Signed)
Braxton Hicks Contractions °Contractions of the uterus can occur throughout pregnancy, but they are not always a sign that you are in labor. You may have practice contractions called Braxton Hicks contractions. These false labor contractions are sometimes confused with true labor. °What are Braxton Hicks contractions? °Braxton Hicks contractions are tightening movements that occur in the muscles of the uterus before labor. Unlike true labor contractions, these contractions do not result in opening (dilation) and thinning of the cervix. Toward the end of pregnancy (32-34 weeks), Braxton Hicks contractions can happen more often and may become stronger. These contractions are sometimes difficult to tell apart from true labor because they can be very uncomfortable. You should not feel embarrassed if you go to the hospital with false labor. °Sometimes, the only way to tell if you are in true labor is for your health care provider to look for changes in the cervix. The health care provider will do a physical exam and may monitor your contractions. If you are not in true labor, the exam should show that your cervix is not dilating and your water has not broken. °If there are no prenatal problems or other health problems associated with your pregnancy, it is completely safe for you to be sent home with false labor. You may continue to have Braxton Hicks contractions until you go into true labor. °How can I tell the difference between true labor and false labor? °· Differences °? False labor °? Contractions last 30-70 seconds.: Contractions are usually shorter and not as strong as true labor contractions. °? Contractions become very regular.: Contractions are usually irregular. °? Discomfort is usually felt in the top of the uterus, and it spreads to the lower abdomen and low back.: Contractions are often felt in the front of the lower abdomen and in the groin. °? Contractions do not go away with walking.: Contractions may  go away when you walk around or change positions while lying down. °? Contractions usually become more intense and increase in frequency.: Contractions get weaker and are shorter-lasting as time goes on. °? The cervix dilates and gets thinner.: The cervix usually does not dilate or become thin. °Follow these instructions at home: °· Take over-the-counter and prescription medicines only as told by your health care provider. °· Keep up with your usual exercises and follow other instructions from your health care provider. °· Eat and drink lightly if you think you are going into labor. °· If Braxton Hicks contractions are making you uncomfortable: °? Change your position from lying down or resting to walking, or change from walking to resting. °? Sit and rest in a tub of warm water. °? Drink enough fluid to keep your urine clear or pale yellow. Dehydration may cause these contractions. °? Do slow and deep breathing several times an hour. °· Keep all follow-up prenatal visits as told by your health care provider. This is important. °Contact a health care provider if: °· You have a fever. °· You have continuous pain in your abdomen. °Get help right away if: °· Your contractions become stronger, more regular, and closer together. °· You have fluid leaking or gushing from your vagina. °· You pass blood-tinged mucus (bloody show). °· You have bleeding from your vagina. °· You have low back pain that you never had before. °· You feel your baby’s head pushing down and causing pelvic pressure. °· Your baby is not moving inside you as much as it used to. °Summary °· Contractions that occur before labor are   called Braxton Hicks contractions, false labor, or practice contractions. °· Braxton Hicks contractions are usually shorter, weaker, farther apart, and less regular than true labor contractions. True labor contractions usually become progressively stronger and regular and they become more frequent. °· Manage discomfort from  Braxton Hicks contractions by changing position, resting in a warm bath, drinking plenty of water, or practicing deep breathing. °This information is not intended to replace advice given to you by your health care provider. Make sure you discuss any questions you have with your health care provider. °Document Released: 10/10/2005 Document Revised: 08/29/2016 Document Reviewed: 08/29/2016 °Elsevier Interactive Patient Education © 2017 [employer] ° ° °Fetal Movement Counts °Patient Name: ________________________________________________ Patient Due Date: ____________________ °What is a fetal movement count? °A fetal movement count is the number of times that you feel your baby move during a certain amount of time. This may also be called a fetal kick count. A fetal movement count is recommended for every pregnant woman. You may be asked to start counting fetal movements as early as week 28 of your pregnancy. °Pay attention to when your baby is most active. You may notice your baby's sleep and wake cycles. You may also notice things that make your baby move more. You should do a fetal movement count: °· When your baby is normally most active. °· At the same time each day. ° °A good time to count movements is while you are resting, after having something to eat and drink. °How do I count fetal movements? °1. Find a quiet, comfortable area. Sit, or lie down on your side. °2. Write down the date, the start time and stop time, and the number of movements that you felt between those two times. Take this information with you to your health care visits. °3. For 2 hours, count kicks, flutters, swishes, rolls, and jabs. You should feel at least 10 movements during 2 hours. °4. You may stop counting after you have felt 10 movements. °5. If you do not feel 10 movements in 2 hours, have something to eat and drink. Then, keep resting and counting for 1 hour. If you feel at least 4 movements during that hour, you may stop  counting. °Contact a health care provider if: °· You feel fewer than 4 movements in 2 hours. °· Your baby is not moving like he or she usually does. °Date: ____________ Start time: ____________ Stop time: ____________ Movements: ____________ °Date: ____________ Start time: ____________ Stop time: ____________ Movements: ____________ °Date: ____________ Start time: ____________ Stop time: ____________ Movements: ____________ °Date: ____________ Start time: ____________ Stop time: ____________ Movements: ____________ °Date: ____________ Start time: ____________ Stop time: ____________ Movements: ____________ °Date: ____________ Start time: ____________ Stop time: ____________ Movements: ____________ °Date: ____________ Start time: ____________ Stop time: ____________ Movements: ____________ °Date: ____________ Start time: ____________ Stop time: ____________ Movements: ____________ °Date: ____________ Start time: ____________ Stop time: ____________ Movements: ____________ °This information is not intended to replace advice given to you by your health care provider. Make sure you discuss any questions you have with your health care provider. °Document Released: 11/09/2006 Document Revised: 06/08/2016 Document Reviewed: 11/19/2015 °Elsevier Interactive Patient Education © 2018 [employer]vier Inc[employer] ° °

## 2017-08-05 NOTE — MAU Note (Signed)
I have communicated with Dr Bjorn Pippin CNM/ Harlon Flor PA and reviewed vital signs:  Vitals:   08/05/17 0454 08/05/17 0536  BP: 99/69 127/68  Pulse: 81 84  Resp:  18  Temp:  97.6 F (36.4 C)    Vaginal exam:  Dilation: 2 (1.5-2 cm) Effacement (%): Thick Cervical Position: Posterior (to pt's right) Station: Ballotable Presentation: Undeterminable Exam by:: Alanzo Lamb StanleyRN,   Also reviewed contraction pattern and that non-stress test is reactive.  It has been documented that patient with occasional contractions and uterine irritability with minimal to no cervical change since last MAU visit not indicating active labor.  Patient denies any other complaints.  Based on this report provider has given order for discharge.  A discharge order and diagnosis entered by a provider.   Labor discharge instructions reviewed with patient.

## 2017-08-06 ENCOUNTER — Inpatient Hospital Stay (HOSPITAL_COMMUNITY)
Admission: AD | Admit: 2017-08-06 | Discharge: 2017-08-08 | DRG: 806 | Disposition: A | Discharge status: Home / Self Care | Payer: Medicaid Other | Source: Ambulatory Visit | Attending: Obstetrics and Gynecology | Admitting: Obstetrics and Gynecology

## 2017-08-06 ENCOUNTER — Encounter (HOSPITAL_COMMUNITY): Payer: Self-pay | Admitting: *Deleted

## 2017-08-06 ENCOUNTER — Inpatient Hospital Stay (HOSPITAL_COMMUNITY): Payer: Medicaid Other | Admitting: Anesthesiology

## 2017-08-06 DIAGNOSIS — Z880 Allergy status to penicillin: Secondary | ICD-10-CM | POA: Diagnosis not present

## 2017-08-06 DIAGNOSIS — D649 Anemia, unspecified: Secondary | ICD-10-CM | POA: Diagnosis present

## 2017-08-06 DIAGNOSIS — Z6833 Body mass index (BMI) 33.0-33.9, adult: Secondary | ICD-10-CM

## 2017-08-06 DIAGNOSIS — O9902 Anemia complicating childbirth: Secondary | ICD-10-CM | POA: Diagnosis present

## 2017-08-06 DIAGNOSIS — O9952 Diseases of the respiratory system complicating childbirth: Secondary | ICD-10-CM | POA: Diagnosis present

## 2017-08-06 DIAGNOSIS — O99214 Obesity complicating childbirth: Secondary | ICD-10-CM | POA: Diagnosis present

## 2017-08-06 DIAGNOSIS — Z87891 Personal history of nicotine dependence: Secondary | ICD-10-CM | POA: Diagnosis not present

## 2017-08-06 DIAGNOSIS — O480 Post-term pregnancy: Principal | ICD-10-CM | POA: Diagnosis present

## 2017-08-06 DIAGNOSIS — Z3A40 40 weeks gestation of pregnancy: Secondary | ICD-10-CM

## 2017-08-06 DIAGNOSIS — E669 Obesity, unspecified: Secondary | ICD-10-CM | POA: Diagnosis present

## 2017-08-06 DIAGNOSIS — F1290 Cannabis use, unspecified, uncomplicated: Secondary | ICD-10-CM | POA: Diagnosis present

## 2017-08-06 DIAGNOSIS — O99324 Drug use complicating childbirth: Secondary | ICD-10-CM | POA: Diagnosis present

## 2017-08-06 DIAGNOSIS — J45909 Unspecified asthma, uncomplicated: Secondary | ICD-10-CM | POA: Diagnosis present

## 2017-08-06 LAB — TYPE AND SCREEN
ABO/RH(D): A POS
ANTIBODY SCREEN: NEGATIVE

## 2017-08-06 LAB — RAPID URINE DRUG SCREEN, HOSP PERFORMED
AMPHETAMINES: NOT DETECTED
BARBITURATES: NOT DETECTED
BENZODIAZEPINES: POSITIVE — AB
COCAINE: NOT DETECTED
Opiates: NOT DETECTED
TETRAHYDROCANNABINOL: POSITIVE — AB

## 2017-08-06 LAB — URINALYSIS, ROUTINE W REFLEX MICROSCOPIC
Bilirubin Urine: NEGATIVE
GLUCOSE, UA: NEGATIVE mg/dL
HGB URINE DIPSTICK: NEGATIVE
Ketones, ur: 80 mg/dL — AB
Leukocytes, UA: NEGATIVE
Nitrite: NEGATIVE
PROTEIN: NEGATIVE mg/dL
Specific Gravity, Urine: 1.02 (ref 1.005–1.030)
pH: 6 (ref 5.0–8.0)

## 2017-08-06 LAB — CBC
HCT: 32.1 % — ABNORMAL LOW (ref 36.0–46.0)
HEMOGLOBIN: 10.7 g/dL — AB (ref 12.0–15.0)
MCH: 29.6 pg (ref 26.0–34.0)
MCHC: 33.3 g/dL (ref 30.0–36.0)
MCV: 88.7 fL (ref 78.0–100.0)
Platelets: 189 10*3/uL (ref 150–400)
RBC: 3.62 MIL/uL — AB (ref 3.87–5.11)
RDW: 14.5 % (ref 11.5–15.5)
WBC: 9.6 10*3/uL (ref 4.0–10.5)

## 2017-08-06 LAB — HEPATITIS B SURFACE ANTIGEN: Hepatitis B Surface Ag: NEGATIVE

## 2017-08-06 MED ORDER — PHENYLEPHRINE 40 MCG/ML (10ML) SYRINGE FOR IV PUSH (FOR BLOOD PRESSURE SUPPORT)
80.0000 ug | PREFILLED_SYRINGE | INTRAVENOUS | Status: DC | PRN
  Filled 2017-08-06: qty 5

## 2017-08-06 MED ORDER — PROMETHAZINE HCL 25 MG/ML IJ SOLN
12.5000 mg | Freq: Once | INTRAMUSCULAR | Status: AC
  Administered 2017-08-06: 12.5 mg via INTRAVENOUS
  Filled 2017-08-06: qty 1

## 2017-08-06 MED ORDER — ZOLPIDEM TARTRATE 5 MG PO TABS: 5.0000 mg | ORAL_TABLET | Freq: Every evening | ORAL | Status: DC | PRN

## 2017-08-06 MED ORDER — OXYTOCIN 40 UNITS IN LACTATED RINGERS INFUSION - SIMPLE MED
2.5000 [IU]/h | INTRAVENOUS | Status: DC
  Administered 2017-08-06: 2.5 [IU]/h via INTRAVENOUS

## 2017-08-06 MED ORDER — PHENYLEPHRINE 40 MCG/ML (10ML) SYRINGE FOR IV PUSH (FOR BLOOD PRESSURE SUPPORT)
80.0000 ug | PREFILLED_SYRINGE | INTRAVENOUS | Status: DC | PRN
  Filled 2017-08-06: qty 10
  Filled 2017-08-06: qty 5

## 2017-08-06 MED ORDER — TERBUTALINE SULFATE 1 MG/ML IJ SOLN: 0.2500 mg | Freq: Once | INTRAMUSCULAR | Status: DC | PRN

## 2017-08-06 MED ORDER — SODIUM CHLORIDE 0.9% FLUSH: 3.0000 mL | Freq: Two times a day (BID) | INTRAVENOUS | Status: DC

## 2017-08-06 MED ORDER — LACTATED RINGERS IV SOLN
INTRAVENOUS | Status: DC
Start: 2017-08-06 — End: 2017-08-06
  Administered 2017-08-06: 09:00:00 via INTRAVENOUS

## 2017-08-06 MED ORDER — SODIUM CHLORIDE 0.9 % IV SOLN: 250.0000 mL | INTRAVENOUS | Status: DC | PRN

## 2017-08-06 MED ORDER — TETANUS-DIPHTH-ACELL PERTUSSIS 5-2.5-18.5 LF-MCG/0.5 IM SUSP: 0.5000 mL | Freq: Once | INTRAMUSCULAR | Status: DC

## 2017-08-06 MED ORDER — DIBUCAINE 1 % RE OINT: 1.0000 "application " | TOPICAL_OINTMENT | RECTAL | Status: DC | PRN

## 2017-08-06 MED ORDER — DIPHENHYDRAMINE HCL 50 MG/ML IJ SOLN: 12.5000 mg | INTRAMUSCULAR | Status: DC | PRN

## 2017-08-06 MED ORDER — COCONUT OIL OIL: 1.0000 "application " | TOPICAL_OIL | Status: DC | PRN

## 2017-08-06 MED ORDER — LACTATED RINGERS IV SOLN: 500.0000 mL | Freq: Once | INTRAVENOUS | Status: DC

## 2017-08-06 MED ORDER — OXYCODONE-ACETAMINOPHEN 5-325 MG PO TABS: 2.0000 | ORAL_TABLET | ORAL | Status: DC | PRN

## 2017-08-06 MED ORDER — DIPHENHYDRAMINE HCL 25 MG PO CAPS: 25.0000 mg | ORAL_CAPSULE | Freq: Four times a day (QID) | ORAL | Status: DC | PRN

## 2017-08-06 MED ORDER — IBUPROFEN 600 MG PO TABS
600.0000 mg | ORAL_TABLET | Freq: Four times a day (QID) | ORAL | Status: DC
  Administered 2017-08-06 – 2017-08-08 (×8): 600 mg via ORAL
  Filled 2017-08-06 (×8): qty 1

## 2017-08-06 MED ORDER — ACETAMINOPHEN 325 MG PO TABS: 650.0000 mg | ORAL_TABLET | ORAL | Status: DC | PRN

## 2017-08-06 MED ORDER — LIDOCAINE HCL (PF) 1 % IJ SOLN
30.0000 mL | INTRAMUSCULAR | Status: DC | PRN
  Filled 2017-08-06: qty 30

## 2017-08-06 MED ORDER — MISOPROSTOL 25 MCG QUARTER TABLET
25.0000 ug | ORAL_TABLET | ORAL | Status: DC | PRN
  Filled 2017-08-06: qty 1

## 2017-08-06 MED ORDER — LIDOCAINE HCL (PF) 1 % IJ SOLN
INTRAMUSCULAR | Status: DC | PRN
  Administered 2017-08-06: 2 mL via EPIDURAL
  Administered 2017-08-06: 5 mL via EPIDURAL
  Administered 2017-08-06: 3 mL via EPIDURAL

## 2017-08-06 MED ORDER — TERBUTALINE SULFATE 1 MG/ML IJ SOLN
0.2500 mg | Freq: Once | INTRAMUSCULAR | Status: DC | PRN
  Filled 2017-08-06: qty 1

## 2017-08-06 MED ORDER — EPHEDRINE 5 MG/ML INJ
10.0000 mg | INTRAVENOUS | Status: DC | PRN
  Filled 2017-08-06: qty 2

## 2017-08-06 MED ORDER — HYDROXYZINE HCL 50 MG/ML IM SOLN
50.0000 mg | Freq: Once | INTRAMUSCULAR | Status: AC
  Administered 2017-08-06: 50 mg via INTRAMUSCULAR
  Filled 2017-08-06: qty 1

## 2017-08-06 MED ORDER — ACETAMINOPHEN 325 MG PO TABS
650.0000 mg | ORAL_TABLET | ORAL | Status: DC | PRN
  Administered 2017-08-07 – 2017-08-08 (×2): 650 mg via ORAL
  Filled 2017-08-06 (×2): qty 2

## 2017-08-06 MED ORDER — METOCLOPRAMIDE HCL 5 MG/ML IJ SOLN: 10.0000 mg | Freq: Once | INTRAMUSCULAR | Status: DC

## 2017-08-06 MED ORDER — SODIUM CHLORIDE 0.9% FLUSH: 3.0000 mL | INTRAVENOUS | Status: DC | PRN

## 2017-08-06 MED ORDER — CYCLOBENZAPRINE HCL 10 MG PO TABS
10.0000 mg | ORAL_TABLET | Freq: Once | ORAL | Status: AC
Start: 2017-08-06 — End: 2017-08-06
  Administered 2017-08-06: 10 mg via ORAL
  Filled 2017-08-06: qty 1

## 2017-08-06 MED ORDER — ONDANSETRON 8 MG PO TBDP
8.0000 mg | ORAL_TABLET | Freq: Once | ORAL | Status: AC
  Administered 2017-08-06: 8 mg via ORAL
  Filled 2017-08-06: qty 1

## 2017-08-06 MED ORDER — FAMOTIDINE IN NACL 20-0.9 MG/50ML-% IV SOLN
20.0000 mg | Freq: Once | INTRAVENOUS | Status: AC
  Administered 2017-08-06: 20 mg via INTRAVENOUS
  Filled 2017-08-06: qty 50

## 2017-08-06 MED ORDER — MEASLES, MUMPS & RUBELLA VAC ~~LOC~~ INJ
0.5000 mL | INJECTION | Freq: Once | SUBCUTANEOUS | Status: DC
Start: 2017-08-07 — End: 2017-08-08
  Filled 2017-08-06: qty 0.5

## 2017-08-06 MED ORDER — FENTANYL 2.5 MCG/ML BUPIVACAINE 1/10 % EPIDURAL INFUSION (WH - ANES)
14.0000 mL/h | INTRAMUSCULAR | Status: DC | PRN
  Administered 2017-08-06: 14 mL/h via EPIDURAL
  Filled 2017-08-06: qty 100

## 2017-08-06 MED ORDER — LACTATED RINGERS IV SOLN: 500.0000 mL | INTRAVENOUS | Status: DC | PRN

## 2017-08-06 MED ORDER — M.V.I. ADULT IV INJ
Freq: Once | INTRAVENOUS | Status: AC
  Administered 2017-08-06: 09:00:00 via INTRAVENOUS
  Filled 2017-08-06: qty 1000

## 2017-08-06 MED ORDER — OXYTOCIN 40 UNITS IN LACTATED RINGERS INFUSION - SIMPLE MED
1.0000 m[IU]/min | INTRAVENOUS | Status: DC
  Administered 2017-08-06: 2 m[IU]/min via INTRAVENOUS
  Filled 2017-08-06: qty 1000

## 2017-08-06 MED ORDER — IBUPROFEN 600 MG PO TABS: 600.0000 mg | ORAL_TABLET | Freq: Four times a day (QID) | ORAL | Status: DC

## 2017-08-06 MED ORDER — SIMETHICONE 80 MG PO CHEW: 80.0000 mg | CHEWABLE_TABLET | ORAL | Status: DC | PRN

## 2017-08-06 MED ORDER — OXYTOCIN BOLUS FROM INFUSION
500.0000 mL | Freq: Once | INTRAVENOUS | Status: AC
  Administered 2017-08-06: 500 mL via INTRAVENOUS

## 2017-08-06 MED ORDER — ONDANSETRON HCL 4 MG/2ML IJ SOLN: 4.0000 mg | Freq: Four times a day (QID) | INTRAMUSCULAR | Status: DC | PRN

## 2017-08-06 MED ORDER — FLEET ENEMA 7-19 GM/118ML RE ENEM: 1.0000 | ENEMA | RECTAL | Status: DC | PRN

## 2017-08-06 MED ORDER — ONDANSETRON HCL 4 MG PO TABS: 4.0000 mg | ORAL_TABLET | ORAL | Status: DC | PRN

## 2017-08-06 MED ORDER — ONDANSETRON HCL 4 MG/2ML IJ SOLN: 4.0000 mg | INTRAMUSCULAR | Status: DC | PRN

## 2017-08-06 MED ORDER — WITCH HAZEL-GLYCERIN EX PADS: 1.0000 "application " | MEDICATED_PAD | CUTANEOUS | Status: DC | PRN

## 2017-08-06 MED ORDER — LACTATED RINGERS IV BOLUS (SEPSIS): 1000.0000 mL | Freq: Once | INTRAVENOUS | Status: DC

## 2017-08-06 MED ORDER — BENZOCAINE-MENTHOL 20-0.5 % EX AERO: 1.0000 "application " | INHALATION_SPRAY | CUTANEOUS | Status: DC | PRN

## 2017-08-06 MED ORDER — PRENATAL MULTIVITAMIN CH
1.0000 | ORAL_TABLET | Freq: Every day | ORAL | Status: DC
  Administered 2017-08-07 – 2017-08-08 (×2): 1 via ORAL
  Filled 2017-08-06 (×2): qty 1

## 2017-08-06 MED ORDER — SENNOSIDES-DOCUSATE SODIUM 8.6-50 MG PO TABS
2.0000 | ORAL_TABLET | ORAL | Status: DC
  Administered 2017-08-06 – 2017-08-08 (×2): 2 via ORAL
  Filled 2017-08-06 (×2): qty 2

## 2017-08-06 MED ORDER — SOD CITRATE-CITRIC ACID 500-334 MG/5ML PO SOLN
30.0000 mL | ORAL | Status: DC | PRN
Start: 2017-08-06 — End: 2017-08-06

## 2017-08-06 MED ORDER — OXYCODONE-ACETAMINOPHEN 5-325 MG PO TABS: 1.0000 | ORAL_TABLET | ORAL | Status: DC | PRN

## 2017-08-06 NOTE — H&P (Signed)
Mercedes [name] is a 26 y.o. female presenting for ctx and a fall at 2300 10/13.  Reports uncontrolled back pain during pregnancy and hyperemesis. Transfer of care from [facility] high risk clinic. Reports using marijuana to control nausea.  Desires epidural for pain management.  Request BTL, reports signing consent at 38w.  Plans to bottle feed inf.   OB History    Gravida Para Term Preterm AB Living   0 1 2   SAB TAB Ectopic Multiple Live Births   0 0 0 0 2      Obstetric Comments   G1: 2014 3430gm SVD G2: 01/2014 3195gm SVD/2nd     Past Medical History:  Diagnosis Date  . Anemia   . Asthma   . Hypokalemia   . Kidney stone 2018   Past Surgical History:  Procedure Laterality Date  . NO PAST SURGERIES    . WISDOM TOOTH EXTRACTION     Family History: family history includes Asthma in her mother; Cancer in her mother. Social History:  reports that she has quit smoking. Her smoking use included Cigarettes. She has never used smokeless tobacco. She reports that she uses drugs, including Marijuana and Barbituates. She reports that she does not drink alcohol.     Maternal Diabetes: No Genetic Screening: Declined Maternal Ultrasounds/Referrals: Normal Fetal Ultrasounds or other Referrals:  None, Referred to Materal Fetal Medicine  Maternal Substance Abuse:  Yes:  Type: Marijuana, Other:  Benzo  Significant Maternal Medications:  Meds include: Other: Flexeril, zofran, vistaril, pepcid, reglan Significant Maternal Lab Results:  Lab values include: Group B Strep negative Other Comments:  hyperemesis,   Review of Systems  Gastrointestinal: Positive for abdominal pain.       Uterine ctx  All other systems reviewed and are negative.  Maternal Medical History:  Reason for admission: Contractions.   Contractions: Onset was 6-12 hours ago.   Frequency: irregular.   Perceived severity is moderate.    Fetal activity: Perceived fetal activity is normal.   Last  perceived fetal movement was within the past hour.    Prenatal complications: Substance abuse.   Hyperemesis.  + benxo and THC on admission  Prenatal Complications - Diabetes: none.    Dilation: 2.5 Effacement (%): 70 Station: -1 Exam by:: Hart Rochester, CNM Blood pressure 129/68, pulse 66, temperature 98.6 F (37 C), temperature source Oral, resp. rate 18, height  (1.626 m), weight 88.5 kg (195 lb), last menstrual period 10/28/2016, SpO2 100 %.  AROM @ 0900 light mec  Maternal Exam:  Uterine Assessment: Contraction strength is mild.  Contraction frequency is irregular.   Abdomen: Patient reports no abdominal tenderness. Fetal presentation: vertex  Introitus: Normal vulva. Normal vagina.  Amniotic fluid character: meconium stained.  Pelvis: adequate for delivery.   Cervix: Cervix evaluated by digital exam.     Fetal Exam Fetal Monitor Review: Baseline rate: 135.  Variability: moderate (6-25 bpm).   Pattern: accelerations present and no decelerations.    Fetal State Assessment: Category I - tracings are normal.     Physical Exam  Nursing note and vitals reviewed. Constitutional: She is oriented to person, place, and time. She appears well-developed and well-nourished.  HENT:  Head: Normocephalic.  Eyes: Pupils are equal, round, and reactive to light.  Neck: Normal range of motion. Neck supple.  Cardiovascular: Normal rate, regular rhythm and normal heart sounds.   Respiratory: Effort normal and breath sounds normal.  GI: Soft.  Genitourinary: Vagina normal and  uterus normal.  Genitourinary Comments: Gravid uterus  Musculoskeletal: Normal range of motion. She exhibits no edema.  Neurological: She is alert and oriented to person, place, and time. She has normal reflexes.  Skin: Skin is warm and dry.  Psychiatric: She has a normal mood and affect. Her behavior is normal.    Prenatal labs: ABO, Rh: --/--/A POS (07/21 1612) Antibody:   Rubella:   RPR: Non Reactive  (08/16 1536)  HBsAg:    HIV:   neg GBS: Negative (09/26 0000)   Assessment/Plan: G4P2 @ 40+2 IOL for postdates hyperemesis Substance abuse: benzo and THC Anemia of pregnancy  Induction with AROM and Pit  Epidural for pain management IV electrolyte replacement Supplemental iron PP Consult with physician prn Anticipate SVD BTL postpartum   Cinthya Bowmaker-Kareen, SNM 08/06/2017, 9:50 AM  I was present at delivery and agree with above assessment

## 2017-08-06 NOTE — MAU Provider Note (Signed)
History     CSN: [id number]  Arrival date and time: 08/06/17 0359   None     Chief Complaint  Patient presents with  . Contractions  . Fall   HPI   [name] is 26 y.o. female [phone] @ [redacted]w[redacted]d here in MAU following a fall that occurred last night around 11 pm. States she was outside walking to try and ease the labor pains when she tripped over a speed bump and fell directly onto her abdomen. She denies vaginal bleeding. She complaints of pain in her lower back and contraction like pain. + fetal movement. Of note, the patient has 16 MAU visits. She is requesting dilaudid today for pain.   OB History    Gravida Para Term Preterm AB Living   0 1 2   SAB TAB Ectopic Multiple Live Births   0 0 0 0 2      Obstetric Comments   G1: 2014 3430gm SVD G2: 01/2014 3195gm SVD/2nd      Past Medical History:  Diagnosis Date  . Anemia   . Asthma   . Hypokalemia   . Kidney stone 2018    Past Surgical History:  Procedure Laterality Date  . NO PAST SURGERIES    . WISDOM TOOTH EXTRACTION      Family History  Problem Relation Age of Onset  . Cancer Mother        ovarian  . Asthma Mother     Social History  Substance Use Topics  . Smoking status: Former Smoker    Types: Cigarettes  . Smokeless tobacco: Never Used     Comment: with +preg test  . Alcohol use No    Allergies:  Allergies  Allergen Reactions  . Milk-Related Compounds Other (See Comments)    Reaction:  Unknown   . Penicillins Other (See Comments)    Reaction:  Unknown  Has patient had a PCN reaction causing immediate rash, facial/tongue/throat swelling, SOB or lightheadedness with hypotension: Unknown Has patient had a PCN reaction causing severe rash involving mucus membranes or skin necrosis: Unknown Has patient had a PCN reaction that required hospitalization: Unknown Has patient had a PCN reaction occurring within the last 10 years: No If all of the above answers are "NO", then may  proceed with Cephalosporin use.    Prescriptions Prior to Admission  Medication Sig Dispense Refill Last Dose  . ondansetron (ZOFRAN ODT) 8 MG disintegrating tablet Take 1 tablet (8 mg total) by mouth every 8 (eight) hours as needed for nausea or vomiting. 20 tablet 0 08/05/2017 at Unknown time  . Prenatal Vit-Fe Fumarate-FA (PRENATAL MULTIVITAMIN) TABS tablet Take 1 tablet by mouth daily at 12 noon.   08/05/2017 at Unknown time  . albuterol (PROVENTIL HFA;VENTOLIN HFA) 108 (90 Base) MCG/ACT inhaler Inhale 2 puffs into the lungs every 6 (six) hours as needed for wheezing or shortness of breath.   Taking  . albuterol (PROVENTIL) (2.5 MG/3ML) 0.083% nebulizer solution Take 3 mLs (2.5 mg total) by nebulization every 6 (six) hours as needed for wheezing or shortness of breath. 75 mL 0 Taking  . amoxicillin (AMOXIL) 500 MG capsule Take 500 mg by mouth 3 (three) times daily.   07/08/2017 at Unknown time  . dicyclomine (BENTYL) 20 MG tablet Take 1 tablet (20 mg total) by mouth 3 (three) times daily as needed for spasms. 20 tablet 0 Taking  . Doxylamine-Pyridoxine ER (BONJESTA) 20-20 MG TBCR Take 1 tablet by mouth at bedtime.  Take an additional 1 tablet with breakfast if s/s persist 60 tablet 1 Taking  . Elastic Bandages & Supports (COMFORT FIT MATERNITY SUPP LG) MISC 1 application by Does not apply route daily. (Patient not taking: Reported on 07/19/2017) 1 each 0 Not Taking  . fluticasone (FLOVENT HFA) 110 MCG/ACT inhaler Inhale 2 puffs into the lungs 2 (two) times daily. (Patient not taking: Reported on 07/19/2017) 1 Inhaler 3 Not Taking  . HYDROmorphone (DILAUDID) 2 MG tablet Take 1 tablet (2 mg total) by mouth every 6 (six) hours as needed for severe pain. (Patient not taking: Reported on 07/19/2017) 10 tablet 0 Not Taking  . metoCLOPramide (REGLAN) 10 MG tablet Take 1 tablet (10 mg total) by mouth every 6 (six) hours as needed for nausea (nausea/headache). 30 tablet 2 07/08/2017 at Unknown time  . potassium  chloride SA (K-DUR,KLOR-CON) 20 MEQ tablet Take 2 tablets (40 mEq total) by mouth daily. (Patient not taking: Reported on 07/09/2017) 6 tablet 0 Not Taking  . prochlorperazine (COMPAZINE) 10 MG tablet Take 10 mg by mouth every 6 (six) hours as needed for nausea or vomiting.   Taking  . ranitidine (ZANTAC) 150 MG capsule Take 1 capsule (150 mg total) by mouth 2 (two) times daily. (Patient not taking: Reported on 07/08/2017) 60 capsule 2 Not Taking   Results for orders placed or performed during the hospital encounter of 08/06/17 (from the past 48 hour(s))  Urine rapid drug screen (hosp performed)     Status: Abnormal   Collection Time: 08/06/17  6:50 AM  Result Value Ref Range   Opiates NONE DETECTED NONE DETECTED   Cocaine NONE DETECTED NONE DETECTED   Benzodiazepines POSITIVE (A) NONE DETECTED   Amphetamines NONE DETECTED NONE DETECTED   Tetrahydrocannabinol POSITIVE (A) NONE DETECTED   Barbiturates NONE DETECTED NONE DETECTED    Comment:        DRUG SCREEN FOR MEDICAL PURPOSES ONLY.  IF CONFIRMATION IS NEEDED FOR ANY PURPOSE, NOTIFY LAB WITHIN 5 DAYS.        LOWEST DETECTABLE LIMITS FOR URINE DRUG SCREEN Drug Class       Cutoff (ng/mL) Amphetamine      1000 Barbiturate      200 Benzodiazepine   200 Tricyclics       300 Opiates          300 Cocaine          300 THC              50   Urinalysis, Routine w reflex microscopic     Status: Abnormal   Collection Time: 08/06/17  6:50 AM  Result Value Ref Range   Color, Urine YELLOW YELLOW   APPearance CLEAR CLEAR   Specific Gravity, Urine 1.020 1.005 - 1.030   pH 6.0 5.0 - 8.0   Glucose, UA NEGATIVE NEGATIVE mg/dL   Hgb urine dipstick NEGATIVE NEGATIVE   Bilirubin Urine NEGATIVE NEGATIVE   Ketones, ur 80 (A) NEGATIVE mg/dL   Protein, ur NEGATIVE NEGATIVE mg/dL   Nitrite NEGATIVE NEGATIVE   Leukocytes, UA NEGATIVE NEGATIVE   Review of Systems  Gastrointestinal: Positive for abdominal pain.  Genitourinary: Negative for dysuria,  vaginal bleeding and vaginal discharge.   Physical Exam   Blood pressure 102/68, pulse 70, temperature 98.2 F (36.8 C), temperature source Oral, resp. rate 16, height  (1.626 m), weight 88.5 kg (195 lb), last menstrual period 10/28/2016, SpO2 100 %.  Physical Exam  Constitutional: She is oriented to person, place,  and time. She appears well-developed and well-nourished. No distress.  HENT:  Head: Normocephalic.  Eyes: Pupils are equal, round, and reactive to light.  Respiratory: Effort normal.  GI: Soft. She exhibits no distension. There is no tenderness. There is no rebound.  Musculoskeletal: Normal range of motion.  Neurological: She is alert and oriented to person, place, and time.  Skin: Skin is warm. She is not diaphoretic.  Psychiatric: Her behavior is normal. Her affect is inappropriate. She expresses impulsivity.    Fetal Tracing: Baseline: 125 bpm Variability: Moderate  Accelerations: 15x15 Decelerations: none Toco: occasional   MAU Course  Procedures  None  MDM  Flexeril 10 mg PO given Zofran 8 mg PO  Vistaril 50 mg IM Urine shows >80 ketones LR bolus given Pepcid 20 mg IV & Reglan  Discussed patient with Dr. Vergie Living. Patient has had 16 MAU visits in this pregnancy. The patient again today yellowing so loudly that other patient's commenting on the patient's status. This is a repeated behavior for the patient. Informed the patient that I would not give her any further narcotics while in the MAU. Discussed with Dr. Vergie Living who agrees. Dr. Vergie Living recommends discussing the patient with Dr. Debroah Loop @ 0800 when he comes on to discuss induction today.   Urine drug screen positive today for benzodiazapine's and THC.  Report given to Collene Gobble- Craige Cotta CNM who resumes care of the patient. She plans to discuss with Dr. Andee Poles, Harolyn Rutherford, NP  Dr Debroah Loop to room to see pt.  Admit to [employer] for IOL  Assessment and Plan  26 y.o. with IUP at  [redacted]w[redacted]d IOL for postterm pregnancy  Admit to [facility] Cervical ripening vs Pitocin  Sharen Counter, CNM 8:55 AM

## 2017-08-06 NOTE — Plan of Care (Signed)
Problem: Education: Goal: Knowledge of condition will improve Outcome: Completed/Met Date Met: 08/06/17 Admission paperwork completed, patient voiced understanding.  All questions were answered.   

## 2017-08-06 NOTE — Anesthesia Procedure Notes (Signed)
Epidural Patient location during procedure: OB Start time: 08/06/2017 9:52 AM End time: 08/06/2017 9:57 AM  Staffing Anesthesiologist: Cecile Hearing Performed: anesthesiologist   Preanesthetic Checklist Completed: patient identified, pre-op evaluation, timeout performed, IV checked, risks and benefits discussed and monitors and equipment checked  Epidural Patient position: sitting Prep: DuraPrep Patient monitoring: blood pressure and continuous pulse ox Approach: midline Location: L3-L4 Injection technique: LOR air  Needle:  Needle type: Tuohy  Needle gauge: 17 G Needle length: 9 cm Needle insertion depth: 6 cm Catheter size: 19 Gauge Catheter at skin depth: 11 cm Test dose: negative and Other (1% Lidocaine)  Additional Notes Patient identified.  Risk benefits discussed including failed block, incomplete pain control, headache, nerve damage, paralysis, blood pressure changes, nausea, vomiting, reactions to medication both toxic or allergic, and postpartum back pain.  Patient expressed understanding and wished to proceed.  All questions were answered.  Sterile technique used throughout procedure and epidural site dressed with sterile barrier dressing. No paresthesia or other complications noted. The patient did not experience any signs of intravascular injection such as tinnitus or metallic taste in mouth nor signs of intrathecal spread such as rapid motor block. Please see nursing notes for vital signs. Reason for block:procedure for pain

## 2017-08-06 NOTE — MAU Note (Signed)
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 Patient denies any injuries at the time of the fall. At approximately 12 mid-night pt started having uterine contraction and nausea and vomiting yellow emesis. [name]'s vaginal bleeding or leaking.Additionally patient stated that she hasn't felt the baby moving since the fall.

## 2017-08-06 NOTE — Progress Notes (Signed)
Mercedes [name] is a 26 y.o. [phone] at [redacted]w[redacted]d admitted for induction of labor due to Post dates.   Subjective: Pt feeling comfortable with epidural, but is able to feel pressure with ctx.  Objective: BP 120/63   Pulse 72   Temp 98.1 F (36.7 C) (Oral)   Resp 18   Ht  (1.626 m)   Wt 88.5 kg (195 lb)   LMP 10/28/2016 (Exact Date)   SpO2 100%   BMI 33.47 kg/m  No intake/output data recorded.   FHT:  FHR: 140s bpm, variability: moderate,  + accels, occasional variables UC:   regular, every 3-5 minutes SVE:   Dilation: 6 Effacement (%): 70 Station: -1, 0 Exam by:: Camelia Eng, RN   Assessment / Plan: Induction of labor due to post dates,  progressing well on pitocin  Labor: Progressing on Pitocin, will continue to increase if inadequate ctx Fetal Wellbeing:  Category II Pain Control:  Epidural I/D:  n/a Anticipated MOD:  NSVD  Cinthya Bowmaker-Kareen, SNM 08/06/2017, 2:27 PM I assessed this pt  and agree with above assessment

## 2017-08-06 NOTE — Anesthesia Preprocedure Evaluation (Signed)
Anesthesia Evaluation  Patient identified by MRN, date of birth, ID band Patient awake    Reviewed: Allergy & Precautions, NPO status , Patient's Chart, lab work & pertinent test results  Airway Mallampati: II  TM Distance: >3 FB Neck ROM: Full    Dental  (+) Teeth Intact, Dental Advisory Given   Pulmonary asthma , former smoker,    Pulmonary exam normal breath sounds clear to auscultation       Cardiovascular negative cardio ROS Normal cardiovascular exam Rhythm:Regular Rate:Normal     Neuro/Psych negative neurological ROS     GI/Hepatic negative GI ROS, (+)     substance abuse (Barbituate use)  marijuana use,   Endo/Other  Obesity   Renal/GU negative Renal ROS     Musculoskeletal negative musculoskeletal ROS (+)   Abdominal   Peds  Hematology  (+) Blood dyscrasia, anemia , Plt 189k   Anesthesia Other Findings Day of surgery medications reviewed with the patient.  Reproductive/Obstetrics (+) Pregnancy                             Anesthesia Physical Anesthesia Plan  ASA: II  Anesthesia Plan: Epidural   Post-op Pain Management:    Induction:   PONV Risk Score and Plan: Treatment may vary due to age or medical condition  Airway Management Planned:   Additional Equipment:   Intra-op Plan:   Post-operative Plan:   Informed Consent: I have reviewed the patients History and Physical, chart, labs and discussed the procedure including the risks, benefits and alternatives for the proposed anesthesia with the patient or authorized representative who has indicated his/her understanding and acceptance.   Dental advisory given  Plan Discussed with:   Anesthesia Plan Comments: (Patient identified. Risks/Benefits/Options discussed with patient including but not limited to bleeding, infection, nerve damage, paralysis, failed block, incomplete pain control, headache, blood pressure  changes, nausea, vomiting, reactions to medication both or allergic, itching and postpartum back pain. Confirmed with bedside nurse the patient's most recent platelet count. Confirmed with patient that they are not currently taking any anticoagulation, have any bleeding history or any family history of bleeding disorders. Patient expressed understanding and wished to proceed. All questions were answered. )        Anesthesia Quick Evaluation

## 2017-08-06 NOTE — Anesthesia Pain Management Evaluation Note (Signed)
  CRNA Pain Management Visit Note  Patient: Mercedes [name], 26 y.o., female  "Hello I am a member of the anesthesia team at [facility]. We have an anesthesia team available at all times to provide care throughout the hospital, including epidural management and anesthesia for C-section. I don't know your plan for the delivery whether it a natural birth, water birth, IV sedation, nitrous supplementation, doula or epidural, but we want to meet your pain goals."   1.Was your pain managed to your expectations on prior hospitalizations?   Yes   2.What is your expectation for pain management during this hospitalization?     Epidural  3.How can we help you reach that goal? Epidural  Record the patient's initial score and the patient's pain goal.   Pain: 8  Pain Goal: 10 [facility] wants you to be able to say your pain was always managed very well.  Rica Records 08/06/2017

## 2017-08-07 LAB — RPR: RPR Ser Ql: NONREACTIVE

## 2017-08-07 LAB — RUBELLA ANTIBODY, IGM: Rubella IgM: <20 AU/mL (ref 0.0–19.9)

## 2017-08-07 NOTE — Anesthesia Postprocedure Evaluation (Signed)
Anesthesia Post Note  Patient: Mercedes [name]  Procedure(s) Performed: AN AD HOC LABOR EPIDURAL     Patient location during evaluation: Mother Baby Anesthesia Type: Epidural Level of consciousness: awake and alert and oriented Pain management: satisfactory to patient Vital Signs Assessment: post-procedure vital signs reviewed and stable Respiratory status: spontaneous breathing and nonlabored ventilation Cardiovascular status: stable Postop Assessment: no headache, no backache, no signs of nausea or vomiting, adequate PO intake and patient able to bend at knees (patient up walking) Anesthetic complications: no    Last Vitals:  Vitals:   08/06/17 2130 08/07/17 0538  BP: (!) 104/59 104/62  Pulse: 68 70  Resp: 18 18  Temp: 36.9 C 36.7 C  SpO2: 99% 100%    Last Pain:  Vitals:   08/07/17 0717  TempSrc:   PainSc: 5    Pain Goal: Patients Stated Pain Goal: 5 (08/06/17 0542)               Madison Hickman

## 2017-08-07 NOTE — Progress Notes (Addendum)
Post Partum Day 1 Subjective: no complaints, up ad lib and voiding. Has some mild pain from where the epidural was placed but well controlled. No numbness or tingling in the LEs.  Objective: Blood pressure 104/62, pulse 70, temperature 98.1 F (36.7 C), temperature source Oral, resp. rate 18, height  (1.626 m), weight 195 lb (88.5 kg), last menstrual period 10/28/2016, SpO2 100 %, unknown if currently breastfeeding.  Physical Exam:  General: alert, cooperative and no distress Lochia: appropriate Uterine Fundus: firm Incision: n/a DVT Evaluation: No evidence of DVT seen on physical exam. No cords or calf tenderness.   Recent Labs  08/06/17 0913  HGB 10.7*  HCT 32.1*    Assessment/Plan: Plan for discharge tomorrow, Breastfeeding and Contraception BTL, must find paperwork from clinic   LOS: 1 day   Mercedes [name] 08/07/2017, 7:51 AM

## 2017-08-08 ENCOUNTER — Encounter (HOSPITAL_COMMUNITY): Payer: Self-pay

## 2017-08-08 MED ORDER — SENNOSIDES-DOCUSATE SODIUM 8.6-50 MG PO TABS: 2.0000 | ORAL_TABLET | Freq: Every evening | ORAL | 0 refills | Status: DC | PRN

## 2017-08-08 MED ORDER — IBUPROFEN 600 MG PO TABS: 600.0000 mg | ORAL_TABLET | Freq: Four times a day (QID) | ORAL | 0 refills | Status: DC

## 2017-08-08 NOTE — Discharge Instructions (Signed)

## 2017-08-08 NOTE — Discharge Summary (Signed)
OB Discharge Summary     Patient Name: Mercedes [name] DOB: 1991-08-11 MRN: [id number]  Date of admission: 08/06/2017 Delivering MD: Lauro Franklin   Date of discharge: 08/08/2017  Admitting diagnosis: 39wks ctx vomiting Intrauterine pregnancy: [redacted]w[redacted]d     Secondary diagnosis:  Active Problems:   Post-dates pregnancy   SVD (spontaneous vaginal delivery)  Additional problems:  Patient Active Problem List   Diagnosis Date Noted  . SVD (spontaneous vaginal delivery) 08/08/2017  . Post-dates pregnancy 08/06/2017  . Supervision of high risk pregnancy, antepartum 06/08/2017  . Insufficient prenatal care in third trimester 06/08/2017  . Substance abuse affecting pregnancy in third trimester, antepartum 06/08/2017  . Obesity in pregnancy with antepartum complication 06/08/2017  . Obesity (BMI 30-39.9) 06/08/2017  . Nausea and vomiting of pregnancy, antepartum 06/08/2017  . Anemia in pregnancy 02/27/2016  . Asthma affecting pregnancy, antepartum 02/27/2016      Discharge diagnosis: Term Pregnancy Delivered                                                                                                Post partum procedures:None  Augmentation: AROM and Pitocin  Complications: None  Hospital course:  Induction of Labor With Vaginal Delivery   26 y.o. yo [phone] at [redacted]w[redacted]d was admitted to the hospital 08/06/2017 for induction of labor.  Indication for induction: Postdates.  Patient had an uncomplicated labor course as follows: Membrane Rupture Time/Date: 9:11 AM ,08/06/2017   Intrapartum Procedures: Episiotomy: None [1]                                         Lacerations:  None [1]  Patient had delivery of a Viable infant.  Information for the patient's newborn:  Beau, Vanduzer [[id number]]  Delivery Method: Vaginal, Spontaneous Delivery (Filed from Delivery Summary)   08/06/2017  Details of delivery can be found in separate delivery note.  Patient had a  routine postpartum course. Patient is discharged home 08/08/17.  Physical exam  Vitals:   08/06/17 2130 08/07/17 0538 08/07/17 1908 08/08/17 0537  BP: (!) 104/59 104/62 107/60 (!) 107/59  Pulse: 68 70 75 62  Resp: Temp: 98.4 F (36.9 C) 98.1 F (36.7 C) 98.1 F (36.7 C) 98.5 F (36.9 C)  TempSrc: Oral Oral Oral Oral  SpO2: 99% 100%    Weight:      Height:       General: alert, cooperative and no distress Lochia: appropriate Uterine Fundus: firm Incision: N/A DVT Evaluation: No evidence of DVT seen on physical exam. Labs: Lab Results  Component Value Date   WBC 9.6 08/06/2017   HGB 10.7 (L) 08/06/2017   HCT 32.1 (L) 08/06/2017   MCV 88.7 08/06/2017   PLT 189 08/06/2017   CMP Latest Ref Rng & Units 06/08/2017  Glucose 65 - 99 mg/dL 96  BUN 6 - 20 mg/dL 9  Creatinine 6.57 - 8.46 mg/dL 9.62(X)  Sodium 528 - 413 mmol/L 137  Potassium 3.5 -  5.2 mmol/L 3.3(L)  Chloride 96 - 106 mmol/L 102  CO2 20 - 29 mmol/L 21  Calcium 8.7 - 10.2 mg/dL 8.7  Total Protein 6.5 - 8.1 g/dL -  Total Bilirubin 0.3 - 1.2 mg/dL -  Alkaline Phos 38 - 045 U/L -  AST 15 - 41 U/L -  ALT 14 - 54 U/L -    Discharge instruction: per After Visit Summary and "Baby and Me Booklet".  After visit meds:  Allergies as of 08/08/2017      Reactions   Penicillins Other (See Comments)   Reaction:  Unknown  Has patient had a PCN reaction causing immediate rash, facial/tongue/throat swelling, SOB or lightheadedness with hypotension: Unknown Has patient had a PCN reaction causing severe rash involving mucus membranes or skin necrosis: Unknown Has patient had a PCN reaction that required hospitalization: Unknown Has patient had a PCN reaction occurring within the last 10 years: No If all of the above answers are "NO", then may proceed with Cephalosporin use.      Medication List    STOP taking these medications   COMFORT FIT MATERNITY SUPP LG Misc   Doxylamine-Pyridoxine ER 20-20 MG  Tbcr Commonly known as:  BONJESTA   fluticasone 110 MCG/ACT inhaler Commonly known as:  FLOVENT HFA   HYDROmorphone 2 MG tablet Commonly known as:  DILAUDID   metoCLOPramide 10 MG tablet Commonly known as:  REGLAN   ondansetron 8 MG disintegrating tablet Commonly known as:  ZOFRAN ODT   potassium chloride SA 20 MEQ tablet Commonly known as:  K-DUR,KLOR-CON   ranitidine 150 MG capsule Commonly known as:  ZANTAC     TAKE these medications   albuterol 108 (90 Base) MCG/ACT inhaler Commonly known as:  PROVENTIL HFA;VENTOLIN HFA Inhale 2 puffs into the lungs every 6 (six) hours as needed for wheezing or shortness of breath.   albuterol (2.5 MG/3ML) 0.083% nebulizer solution Commonly known as:  PROVENTIL Take 3 mLs (2.5 mg total) by nebulization every 6 (six) hours as needed for wheezing or shortness of breath.   dicyclomine 20 MG tablet Commonly known as:  BENTYL Take 1 tablet (20 mg total) by mouth 3 (three) times daily as needed for spasms.   ibuprofen 600 MG tablet Commonly known as:  ADVIL,MOTRIN Take 1 tablet (600 mg total) by mouth every 6 (six) hours.   prenatal multivitamin Tabs tablet Take 1 tablet by mouth daily at 12 noon.   senna-docusate 8.6-50 MG tablet Commonly known as:  Senokot-S Take 2 tablets by mouth at bedtime as needed for mild constipation.       Diet: routine diet  Activity: Advance as tolerated. Pelvic rest for 6 weeks.   Outpatient follow up:4 weeks Follow up Appt:No future appointments. Follow up Visit: Follow-up Information    [facility]. Schedule an appointment as soon as possible for a visit.   Specialty:  Obstetrics and Gynecology Why:  For psotpartum visit in 4 weeks Contact information: [address] [place] [address] [phone]          Postpartum contraception: Tubal Ligation (interval)  Newborn Data: Live born female  Birth Weight: 6 lb 6.8 oz (2914 g) APGAR: 9,  9  Newborn Delivery   Birth date/time:  08/06/2017 14:55:00 Delivery type:  Vaginal, Spontaneous Delivery      Baby Feeding: Bottle Disposition:home with mother   08/08/2017 Caryl Ada, DO

## 2017-08-08 NOTE — Clinical Social Work Maternal (Signed)
CLINICAL SOCIAL WORK MATERNAL/CHILD NOTE  Patient Details  Name: Mercedes [name] MRN: [id number] Date of Birth: 04/08/1991  Date:  2017/04/19  Clinical Social Worker Initiating Note:  Terri Piedra, LCSW Date/Time: Initiated:  08/08/17/1000     Child's Name:  Mercedes [name]   Biological Parents:  Mother, Father Darleen Crocker and Nicholas Lose)   Need for Interpreter:  None   Reason for Referral:  Current Substance Use/Substance Use During Pregnancy    Address:  [place] [address]    Phone number:  [phone] (home)     Additional phone number:   Household Members/Support Persons (HM/SP):   Household Member/Support Person 1, Household Member/Support Person 2, Household Member/Support Person 3   HM/SP Name Relationship DOB or Age  HM/SP -[place] mother    HM/SP -2 Delfino Lovett daughter 4  HM/SP -3 Zhania Shaheen daughter 3  HM/SP -4        HM/SP -5        HM/SP -6        HM/SP -7        HM/SP -8          Natural Supports (not living in the home):   (FOB was present/asleep on the couch.  He is the father of her second child as well.  MOB states they co-parent, but are not in a relationship.  )   Professional Supports: None   Employment:     Type of Work: MOB states she is not working at this time.   Education:      Homebound arranged:    Financial Resources:  Medicaid   Other Resources:  WIC, Food Stamps    Cultural/Religious Considerations Which May Impact Care: None stated.  Strengths:  Ability to meet basic needs , Home prepared for child , Pediatrician chosen   Psychotropic Medications:         Pediatrician:    [place] area  Pediatrician List:   [facility] ([address])  [place]      Pediatrician Fax Number:    Risk Factors/Current Problems:  Substance Use    Cognitive  State:  Able to Concentrate , Alert , Linear Thinking    Mood/Affect:  Calm , Interested    CSW Assessment: CSW met with MOB in her first floor room/107 to offer support and complete assessment for substance use in pregnancy.  CSW notes positive drug screens on 02/18/17 and 02/23/17 for THC and Barbiturates.  MOB had a positive UDS for THC on 03/14/17 and was positive for THC and Benzodiazepines on admission for delivery on 2017-04-08.   MOB was pleasant and welcoming of CSW's visit, though quiet and somewhat reserved initially.  She reports that labor and delivery went "better than expected," and that she and baby are doing very well.  She states she is feeling well emotionally and reports no experience with PMADs after her first two deliveries.  MOB explained that she had limited PNC because of moving from [place] to [place] to live with her mother mid-way through the pregnancy.  She reports FOB is involved, but that they are not in a relationship.  MOB states she is originally from the [place], and while [place] is "a great place to raise children," there isn't enough going on here for her.  CSW inquired about substance use  and positive drug screens in pregnancy, as well as informed MOB of hospital drug screen policy and mandated reporting to Child Protective Services.  MOB states she smoked marijuana about 2 times per week when she was "at my wits end" with feeling sick and losing weight.  She reports having hyperemesis.  She states she was 250 lbs when she started the pregnancy and is now weighing 195 lbs.  She states she thought the rapid weight loss "was going to hurt her (baby)."  CSW asked about the positive barbiturate and benzodiazepine.  MOB replied, "what's that?"  She admits only to using marijuana and reports she does not know where the other positive substances came from.  Nursing notes document that cotton balls have been removed from baby's diaper and that baby's UDS has therefore not been  completed.  CSW informed MOB that since she was positive on admission for THC and Benzodiazepines, and there was no UDS for baby, CSW will be making a report to Child Protective Services.  MOB reports hx with CPS one time when she had a restraining order against her oldest child's father and "CPS involvement was mandatory."  She stated understanding and CSW discussed what she may expect from involvement should CPS accept the case.  CSW will monitor CDS results and update CPS accordingly.  CSW identifies not barriers to discharge and spoke with pediatric staff about this.  CSW Plan/Description:  No Further Intervention Required/No Barriers to Discharge, Sudden Infant Death Syndrome (SIDS) Education, Perinatal Mood and Anxiety Disorder (PMADs) Education, Hospital Drug Screen Policy Information, Child Protective Service Report , CSW Will Continue to Monitor Umbilical Cord Tissue Drug Screen Results and Make Report if Warranted    Dodge Ator Elizabeth, LCSW 08/08/2017, 2:52 PM  

## 2017-08-11 ENCOUNTER — Encounter: Payer: Medicaid Other | Admitting: Medical

## 2017-08-11 ENCOUNTER — Inpatient Hospital Stay (HOSPITAL_COMMUNITY): Payer: Medicaid Other

## 2017-08-11 ENCOUNTER — Inpatient Hospital Stay (HOSPITAL_COMMUNITY): Admit: 2017-08-11 | Payer: Medicaid Other

## 2017-09-19 ENCOUNTER — Ambulatory Visit: Payer: Medicaid Other | Admitting: Medical

## 2017-11-23 ENCOUNTER — Ambulatory Visit (HOSPITAL_COMMUNITY)
Admission: EM | Admit: 2017-11-23 | Discharge: 2017-11-23 | Disposition: A | Discharge status: Home / Self Care | Payer: Medicaid Other | Attending: Family Medicine | Admitting: Family Medicine

## 2017-11-23 ENCOUNTER — Other Ambulatory Visit: Payer: Self-pay

## 2017-11-23 ENCOUNTER — Encounter (HOSPITAL_COMMUNITY): Payer: Self-pay | Admitting: Emergency Medicine

## 2017-11-23 DIAGNOSIS — R69 Illness, unspecified: Secondary | ICD-10-CM | POA: Diagnosis not present

## 2017-11-23 DIAGNOSIS — J111 Influenza due to unidentified influenza virus with other respiratory manifestations: Secondary | ICD-10-CM

## 2017-11-23 LAB — POCT URINALYSIS DIP (DEVICE)
BILIRUBIN URINE: NEGATIVE
GLUCOSE, UA: NEGATIVE mg/dL
Ketones, ur: NEGATIVE mg/dL
Leukocytes, UA: NEGATIVE
Nitrite: NEGATIVE
PH: 6.5 (ref 5.0–8.0)
Protein, ur: NEGATIVE mg/dL
Specific Gravity, Urine: 1.025 (ref 1.005–1.030)
Urobilinogen, UA: 0.2 mg/dL (ref 0.0–1.0)

## 2017-11-23 LAB — POCT PREGNANCY, URINE: PREG TEST UR: NEGATIVE

## 2017-11-23 MED ORDER — ALBUTEROL SULFATE HFA 108 (90 BASE) MCG/ACT IN AERS: 1.0000 | INHALATION_SPRAY | Freq: Four times a day (QID) | RESPIRATORY_TRACT | 0 refills | Status: DC | PRN

## 2017-11-23 MED ORDER — ONDANSETRON HCL 4 MG PO TABS: 4.0000 mg | ORAL_TABLET | Freq: Three times a day (TID) | ORAL | 0 refills | Status: DC | PRN

## 2017-11-23 MED ORDER — ALBUTEROL SULFATE HFA 108 (90 BASE) MCG/ACT IN AERS: 2.0000 | INHALATION_SPRAY | Freq: Four times a day (QID) | RESPIRATORY_TRACT | 0 refills | Status: DC | PRN

## 2017-11-23 MED ORDER — ONDANSETRON 4 MG PO TBDP
ORAL_TABLET | ORAL | Status: AC
  Filled 2017-11-23: qty 1

## 2017-11-23 MED ORDER — ONDANSETRON 4 MG PO TBDP
4.0000 mg | ORAL_TABLET | Freq: Once | ORAL | Status: AC
  Administered 2017-11-23: 4 mg via ORAL

## 2017-11-23 NOTE — Discharge Instructions (Signed)
Push fluids to ensure adequate hydration and keep secretions thin.  Tylenol and/or ibuprofen as needed for pain or fevers.  Zofran as needed for nausea.  Inhaler as needed for wheezing or shortness of breath. If symptoms worsen or do not improve in the next week to return to be seen or to follow up with your primary care provider.

## 2017-11-23 NOTE — ED Provider Notes (Signed)
[facility]    CSN: [id number] Arrival date & time: 11/23/17  1445     History   Chief Complaint Chief Complaint  Patient presents with  . Generalized Body Aches    HPI Mercedes [name] is a 27 y.o. female.   Mercedes [name] presents with complaints of body aches, diarrhea, nausea, vomiting, fevers, productive cough, sore throat which started 1/28. States her daughter has been ill although has been improving. Vomited approximately three times today, last was a few hours ago. Feels short of breath with activity. Has not been eating due to nausea, has been taking fluids. Wheezing at night, does not have an inhaler. History of asthma. Without ear pain. Has not taken any medications for symptoms. TMAX of 104 two days ago. Fever yesterday. Without fever today. Without abdominal pain. Has had approximately 4 episodes of diarrhea today. Denies urinary symptoms.    ROS per HPI.       Past Medical History:  Diagnosis Date  . Anemia   . Asthma   . Hypokalemia   . Kidney stone 2018    Patient Active Problem List   Diagnosis Date Noted  . SVD (spontaneous vaginal delivery) 08/08/2017  . Post-dates pregnancy 08/06/2017  . Supervision of high risk pregnancy, antepartum 06/08/2017  . Insufficient prenatal care in third trimester 06/08/2017  . Substance abuse affecting pregnancy in third trimester, antepartum 06/08/2017  . Obesity in pregnancy with antepartum complication 06/08/2017  . Obesity (BMI 30-39.9) 06/08/2017  . Nausea and vomiting of pregnancy, antepartum 06/08/2017  . Anemia in pregnancy 02/27/2016  . Asthma affecting pregnancy, antepartum 02/27/2016    Past Surgical History:  Procedure Laterality Date  . NO PAST SURGERIES    . WISDOM TOOTH EXTRACTION      OB History    Gravida Para Term Preterm AB Living   4 3 3  0 1 3   SAB TAB Ectopic Multiple Live Births   0 0 0 0 3      Obstetric Comments   G1: 2014 3430gm SVD G2: 01/2014 3195gm SVD/2nd        Home Medications    Prior to Admission medications   Medication Sig Start Date End Date Taking? Authorizing Provider  albuterol (PROVENTIL HFA;VENTOLIN HFA) 108 (90 Base) MCG/ACT inhaler Inhale 1-2 puffs into the lungs every 6 (six) hours as needed for wheezing or shortness of breath. 11/23/17   Georgetta HaberBurky, Kimberle Stanfill B, NP  ondansetron (ZOFRAN) 4 MG tablet Take 1 tablet (4 mg total) by mouth every 8 (eight) hours as needed for nausea or vomiting. 11/23/17   Georgetta HaberBurky, Wally Behan B, NP  Prenatal Vit-Fe Fumarate-FA (PRENATAL MULTIVITAMIN) TABS tablet Take 1 tablet by mouth daily at 12 noon.    [provider]    Family History Family History  Problem Relation Age of Onset  . Cancer Mother        ovarian  . Asthma Mother     Social History Social History   Tobacco Use  . Smoking status: Former Smoker    Types: Cigarettes  . Smokeless tobacco: Never Used  . Tobacco comment: with +preg test  Substance Use Topics  . Alcohol use: No  . Drug use: Yes    Types: Marijuana, Barbituates    Comment: may 2018 last "smoked"     Allergies   Penicillins   Review of Systems Review of Systems   Physical Exam Triage Vital Signs ED Triage Vitals  Enc Vitals Group     BP 11/23/17 1459  135/64     Pulse Rate 11/23/17 1459 87     Resp 11/23/17 1459 16     Temp 11/23/17 1459 98.6 F (37 C)     Temp src --      SpO2 11/23/17 1459 100 %     Weight --      Height --      Head Circumference --      Peak Flow --      Pain Score 11/23/17 1500 9     Pain Loc --      Pain Edu? --      Excl. in GC? --    No data found.  Updated Vital Signs BP 135/64   Pulse 87   Temp 98.6 F (37 C)   Resp 16   LMP 11/16/2017   SpO2 100%   Visual Acuity Right Eye Distance:   Left Eye Distance:   Bilateral Distance:    Right Eye Near:   Left Eye Near:    Bilateral Near:     Physical Exam  Constitutional: She is oriented to person, place, and time. She appears well-developed and  well-nourished. No distress.  HENT:  Head: Normocephalic and atraumatic.  Right Ear: Tympanic membrane, external ear and ear canal normal.  Left Ear: Tympanic membrane, external ear and ear canal normal.  Nose: Nose normal.  Mouth/Throat: Uvula is midline, oropharynx is clear and moist and mucous membranes are normal. No tonsillar exudate.  Eyes: Conjunctivae and EOM are normal. Pupils are equal, round, and reactive to light.  Cardiovascular: Normal rate, regular rhythm and normal heart sounds.  Pulmonary/Chest: Effort normal and breath sounds normal. She has no wheezes.  Abdominal: Soft. She exhibits no distension and no mass. There is no tenderness. There is no rebound and no guarding.  Neurological: She is alert and oriented to person, place, and time.  Skin: Skin is warm and dry.     UC Treatments / Results  Labs (all labs ordered are listed, but only abnormal results are displayed) Labs Reviewed  POCT URINALYSIS DIP (DEVICE) - Abnormal; Notable for the following components:      Result Value   Hgb urine dipstick TRACE (*)    All other components within normal limits  POCT PREGNANCY, URINE    EKG  EKG Interpretation None       Radiology No results found.  Procedures Procedures (including critical care time)  Medications Ordered in UC Medications  ondansetron (ZOFRAN-ODT) disintegrating tablet 4 mg (4 mg Oral Given 11/23/17 1534)     Initial Impression / Assessment and Plan / UC Course  I have reviewed the triage vital signs and the nursing notes.  Pertinent labs & imaging results that were available during my care of the patient were reviewed by me and considered in my medical decision making (see chart for details).     Non toxic in appearance. Without acute findings on exam. Tolerating fluids. Now afebrile. Without tachycardia, tachypnea or hypoxia. Zofran provided today for nausea, prn use. History and physical consistent with viral illness.  Supportive  cares recommended.   Final Clinical Impressions(s) / UC Diagnoses   Final diagnoses:  Influenza-like illness    ED Discharge Orders        Ordered    albuterol (PROVENTIL HFA;VENTOLIN HFA) 108 (90 Base) MCG/ACT inhaler  Every 6 hours PRN,   Status:  Discontinued     11/23/17 1537    ondansetron (ZOFRAN) 4 MG tablet  Every 8 hours PRN  11/23/17 1606    albuterol (PROVENTIL HFA;VENTOLIN HFA) 108 (90 Base) MCG/ACT inhaler  Every 6 hours PRN,   Status:  Discontinued     11/23/17 1607    albuterol (PROVENTIL HFA;VENTOLIN HFA) 108 (90 Base) MCG/ACT inhaler  Every 6 hours PRN     11/23/17 1607       Controlled Substance Prescriptions [place] Controlled Substance Registry consulted? Not Applicable   Georgetta Haber, NP 11/23/17 [phone]

## 2017-11-23 NOTE — ED Triage Notes (Signed)
Pt states "my daughter just got over the flu, I feel like im having some symptmos, or if its my asthma. I am in pain everywhere, i've had a fever since Monday. 104.9 at home." pt denies taking any medicine for fever today.

## 2018-09-10 ENCOUNTER — Encounter: Payer: Self-pay | Admitting: *Deleted

## 2018-09-19 IMAGING — US US ABDOMEN COMPLETE
1 series · 15 of 25 positions shown · non-contrast
Comparison: Right upper quadrant abdominal ultrasound dated
05/17/2017.

CLINICAL DATA: Upper abdominal pain. Thirty-one weeks pregnant.
Elevated amylase.

EXAM:
ABDOMEN ULTRASOUND COMPLETE

[Series 1: us abdomen complete · 15 of 87 slices shown]
[im 1/87]
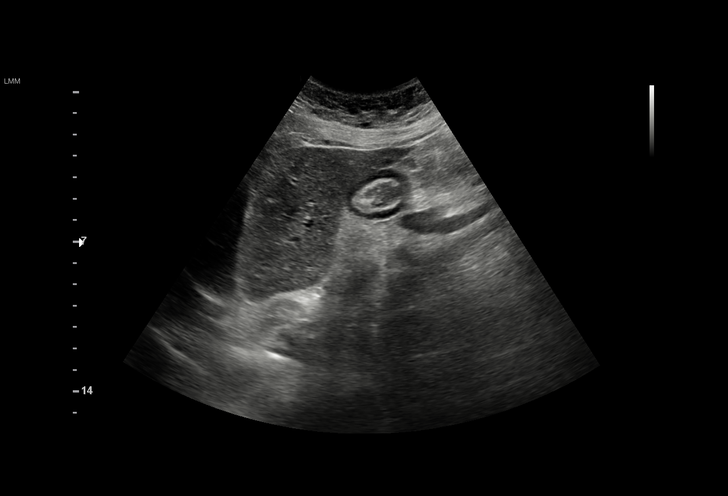
[im 8/87]
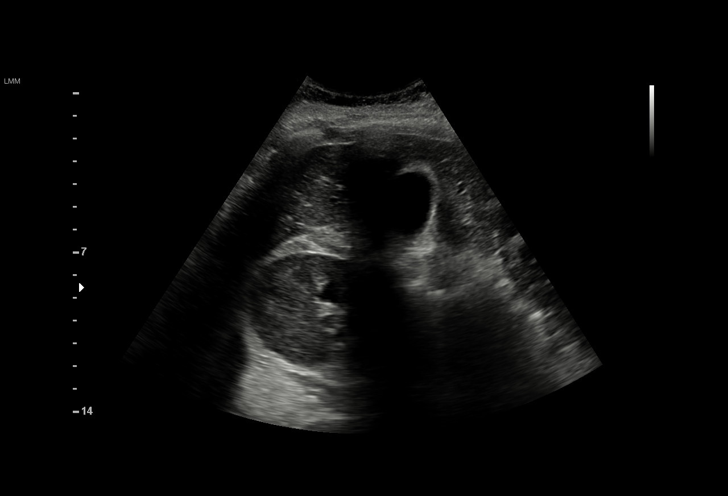
[im 15/87]
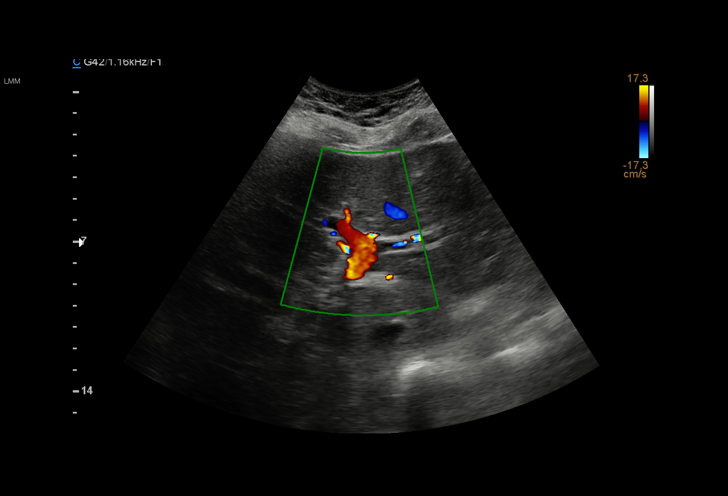
[im 18/87]
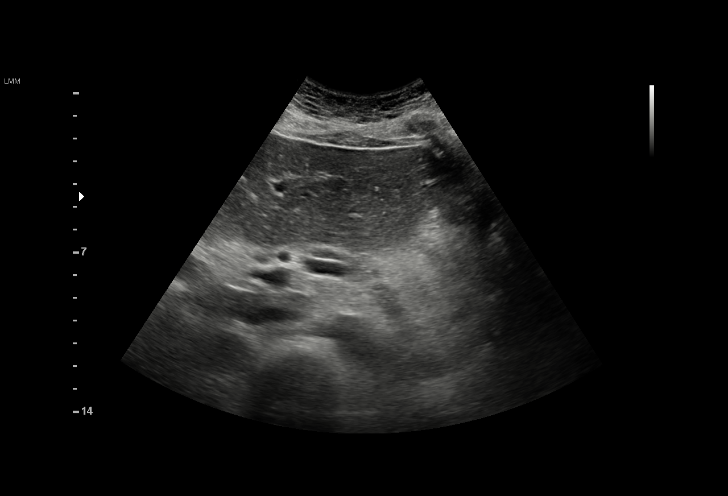
[im 26/87]
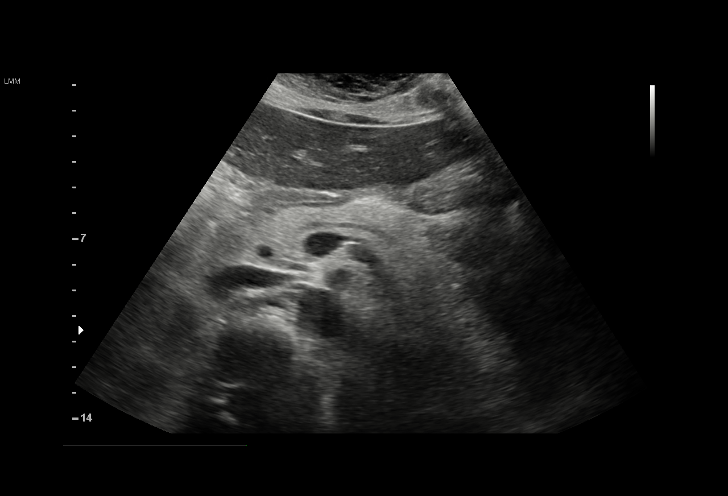
[im 33/87]
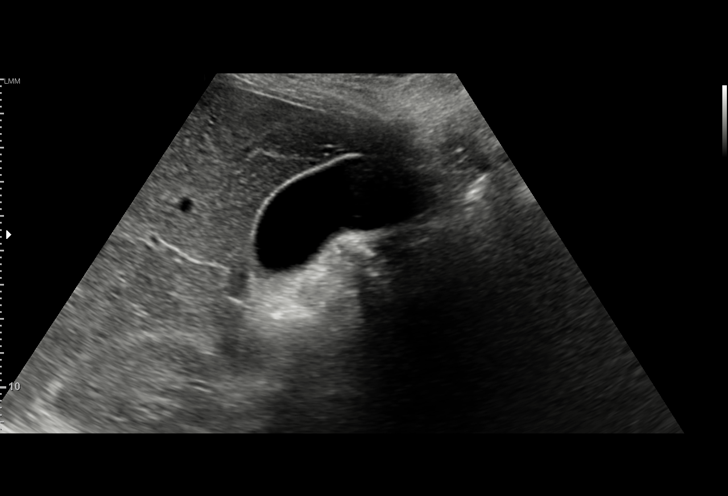
[im 36/87]
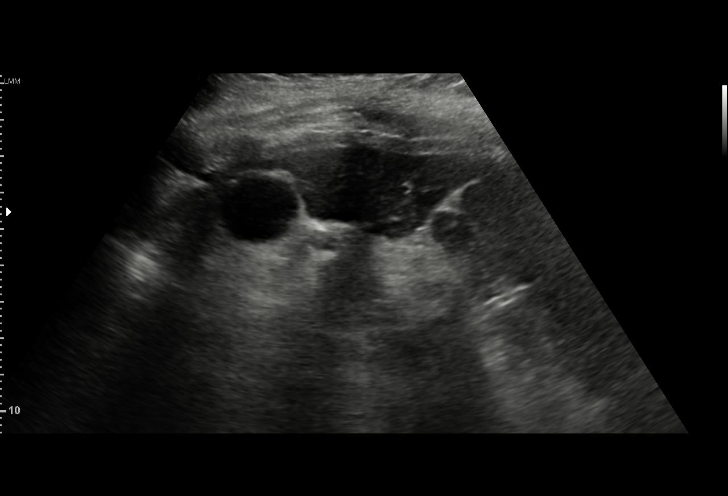
[im 44/87]
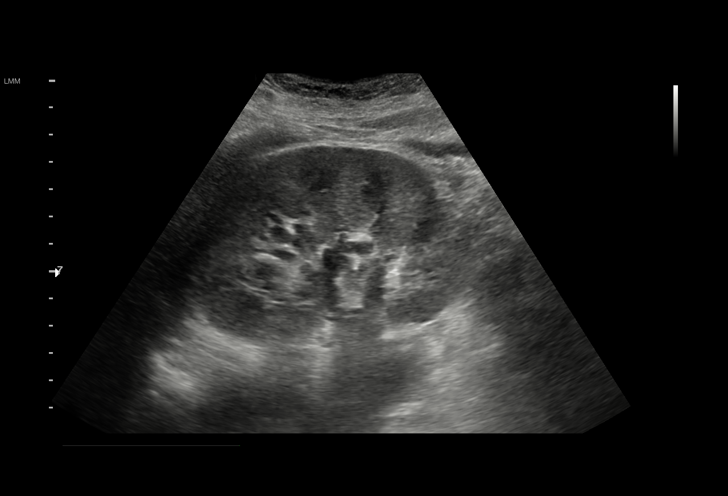
[im 51/87]
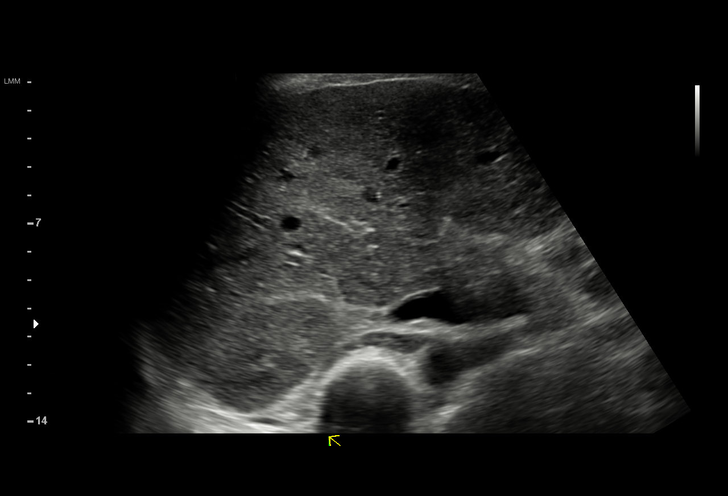
[im 54/87]
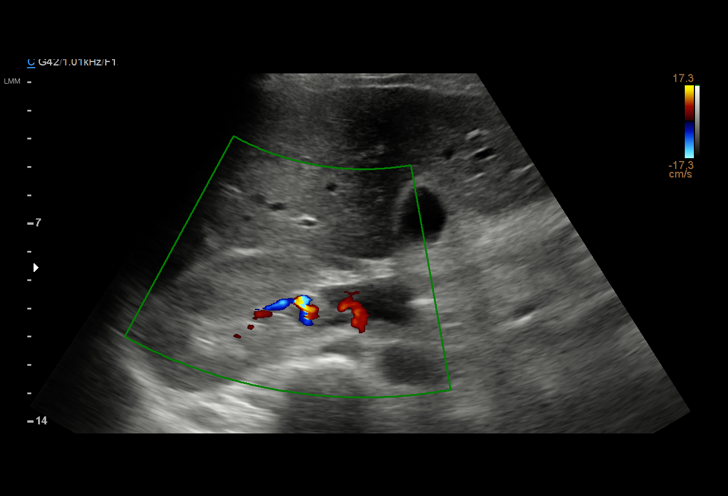
[im 61/87]
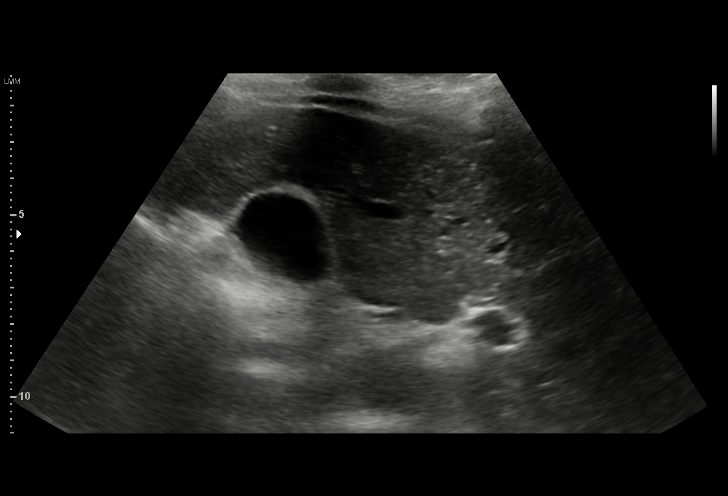
[im 69/87]
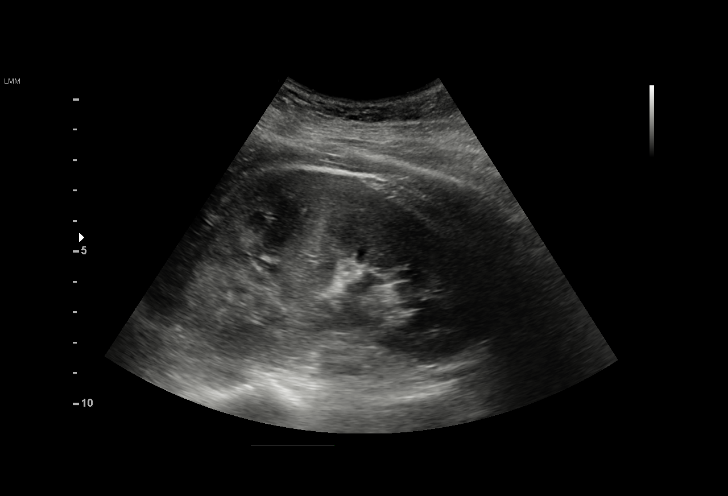
[im 72/87]
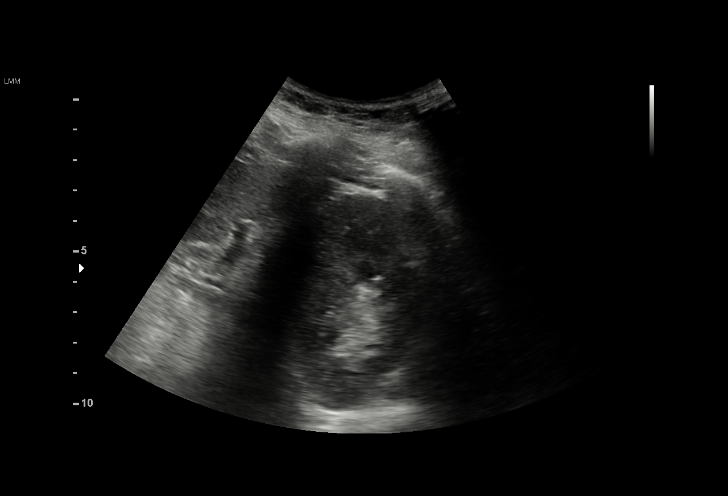
[im 79/87]
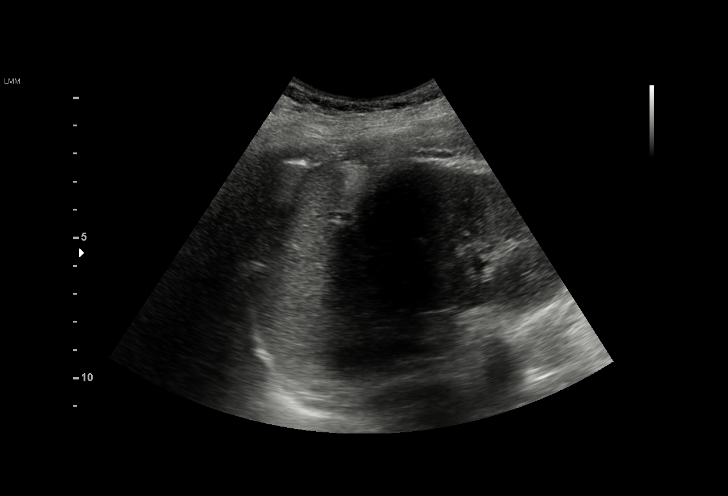
[im 87/87]
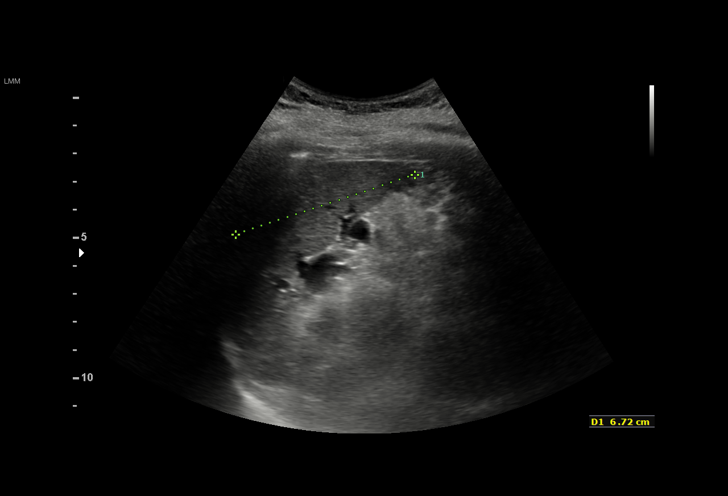

[15 of 25 positions shown; findings below may reference images not displayed]

FINDINGS: Gallbladder: No gallstones or wall thickening visualized. No
sonographic Murphy sign noted by sonographer.

Common bile duct: Diameter: 3.5 mm

Liver: No focal lesion identified. Within normal limits in
parenchymal echogenicity.

IVC: No abnormality visualized.

Pancreas: Visualized portion unremarkable.

Spleen: Size and appearance within normal limits.

Right Kidney: Length: 9.7 cm. Echogenicity within normal limits. No
mass or hydronephrosis visualized.

Left Kidney: Length: 10.0 cm. Echogenicity within normal limits. No
mass or hydronephrosis visualized.

Abdominal aorta: No aneurysm visualized.

Other findings: None.
IMPRESSION: Normal examination.

## 2018-10-21 IMAGING — US US MFM OB LIMITED
1 series · 15 of 18 positions shown · non-contrast
Comparison: none

[Series 1: us mfm ob limited · 18 acquisitions, 15 frames shown]
[im 1/18]
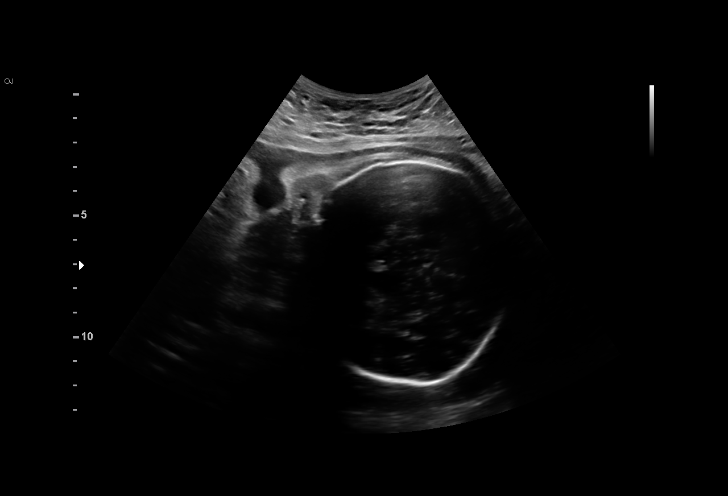
[im 2/18]
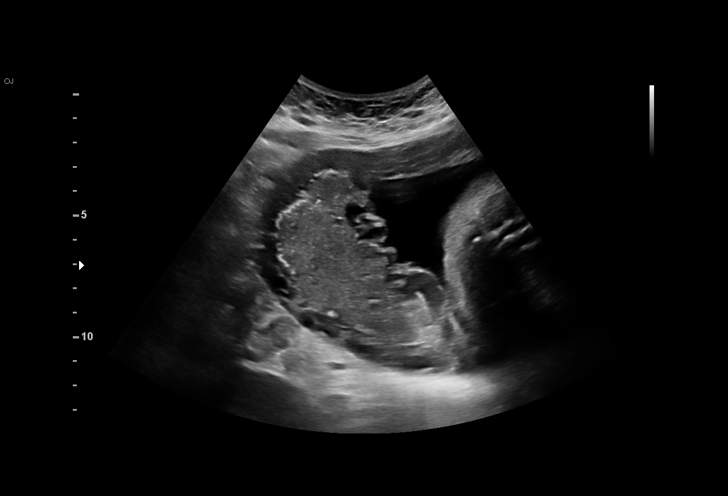
[im 4/18]
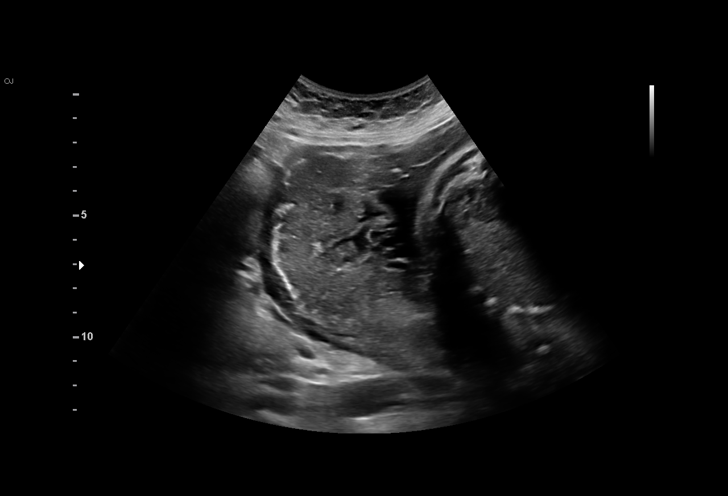
[im 5/18]
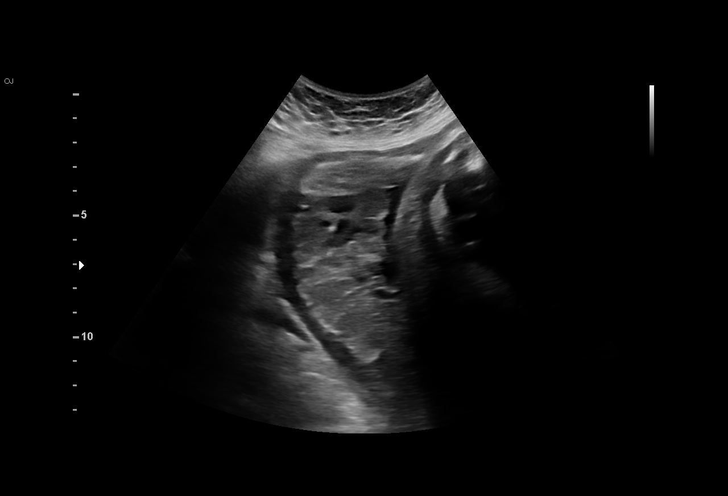
[im 6/18]
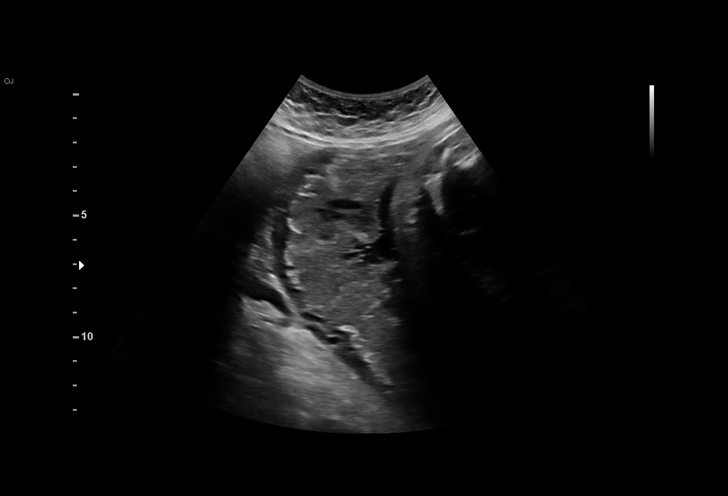
[im 7/18]
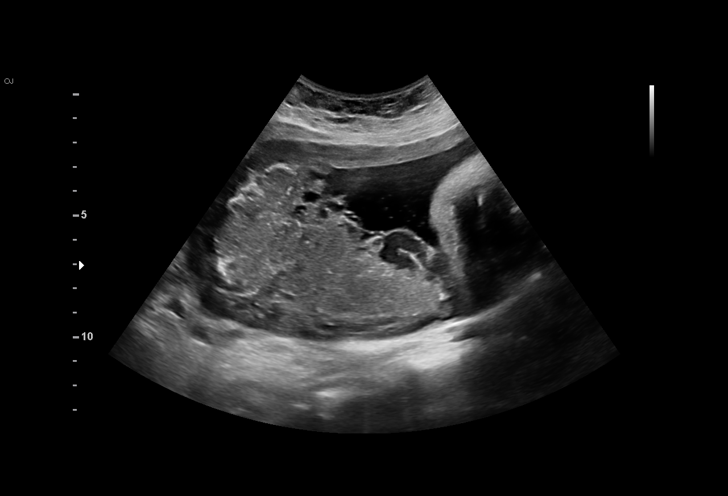
[im 8/18]
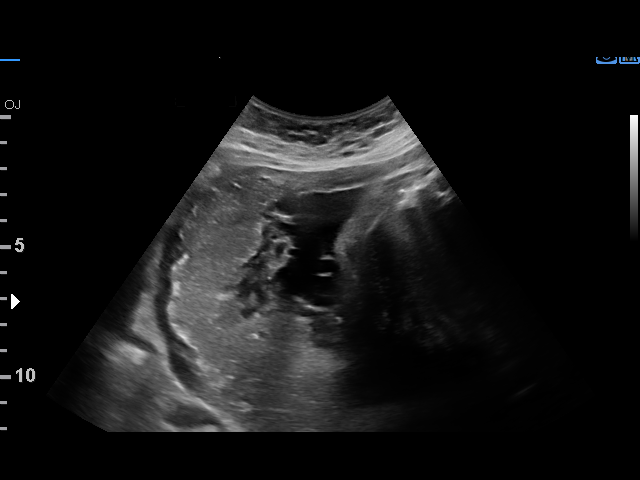
[im 10/18]
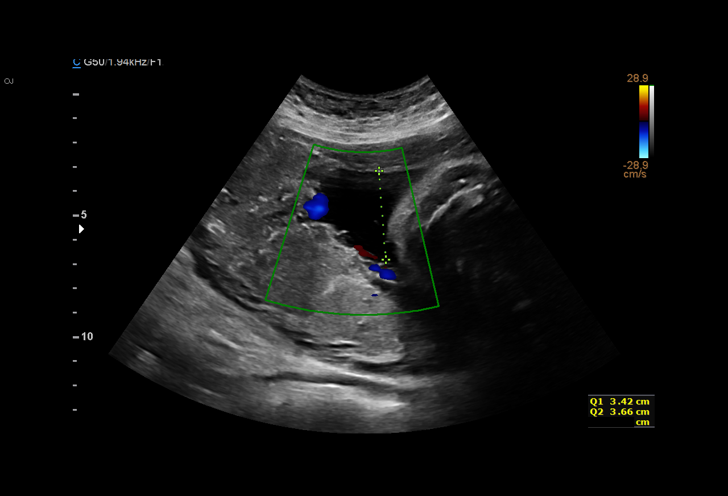
[im 11/18]
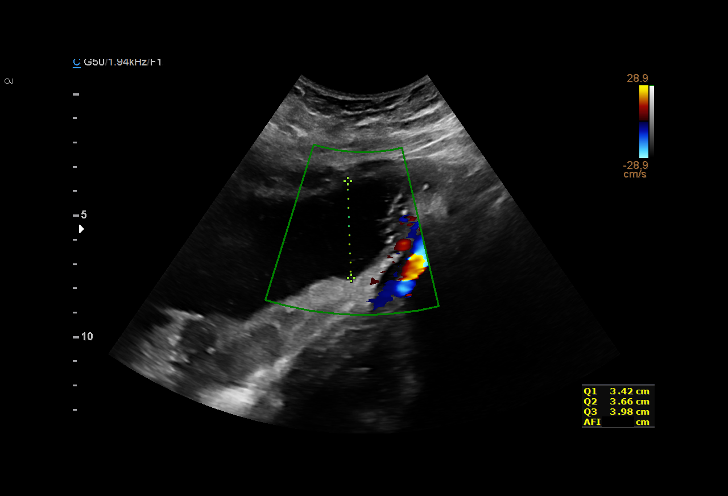
[im 12/18]
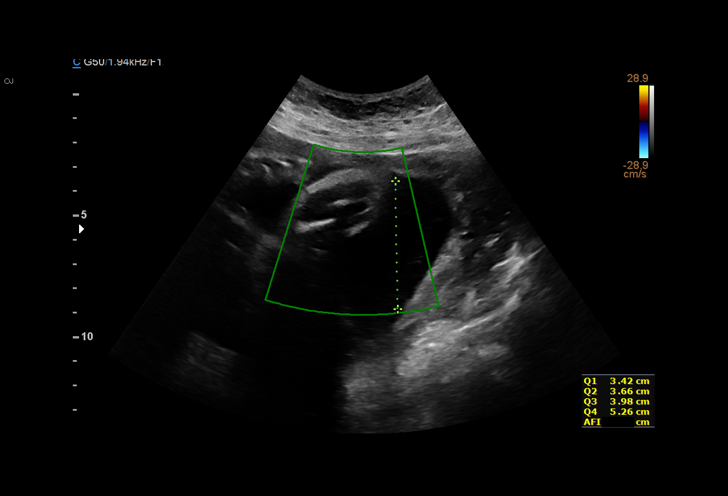
[im 13/18]
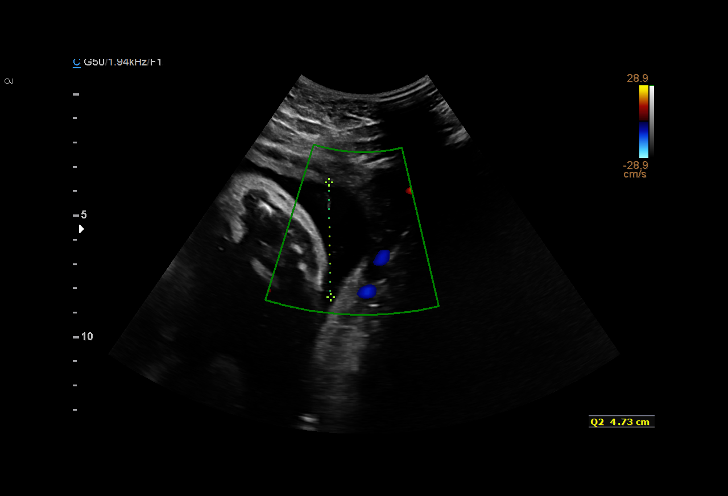
[im 14/18]
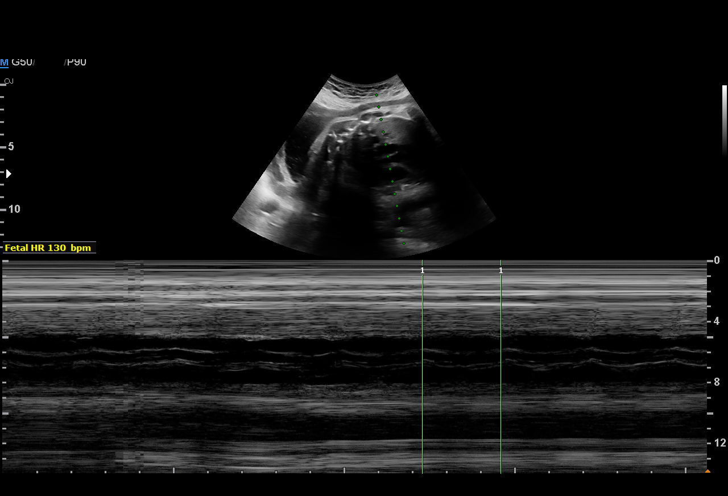
[im 16/18]
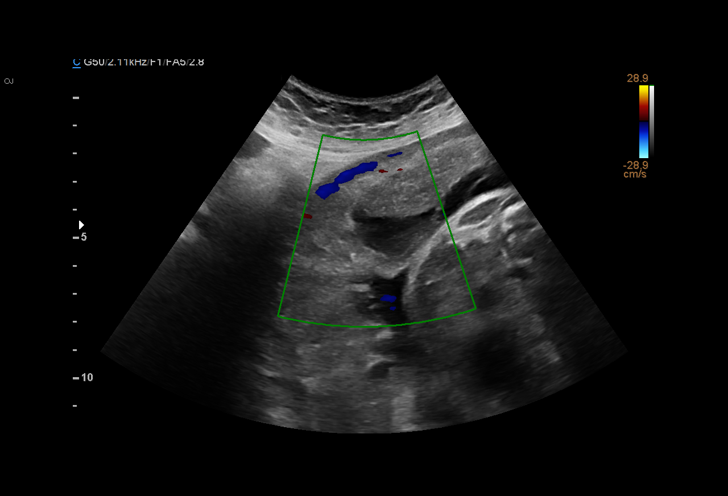
[im 17/18]
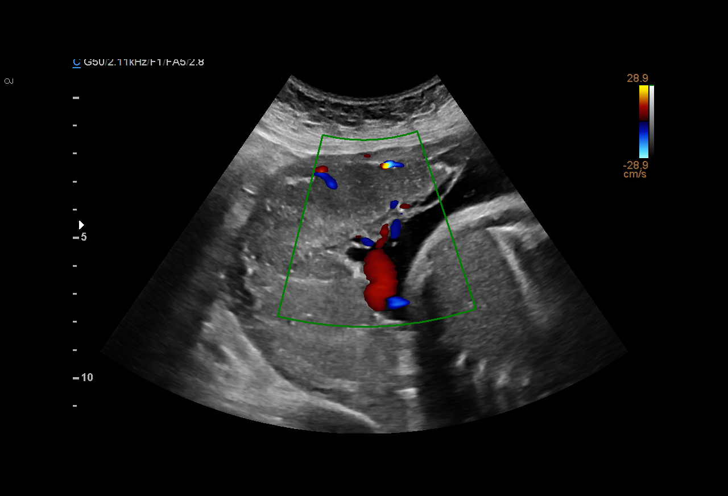
[im 18/18]
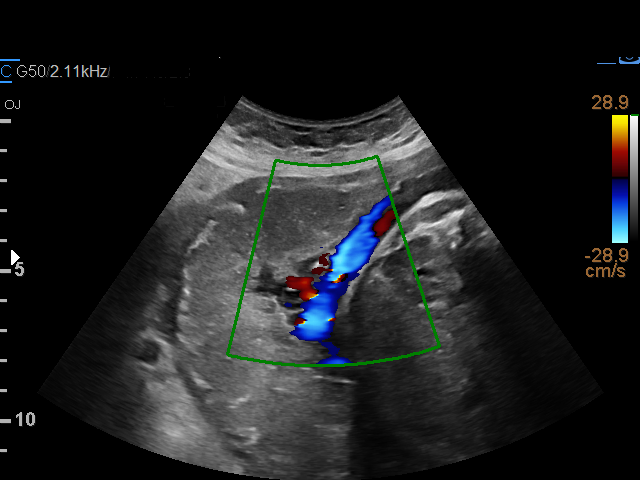

[15 of 18 positions shown; findings below may reference images not displayed]

BR[facility]

MAU/Triage

1  YORDANIS PERSSON          [id number]      [id number]     [id number]
Indications

35 weeks gestation of pregnancy
Abdominal pain in pregnancy
OB History

Gravidity:    4         Term:   2        Prem:   0
Ectopic:      0        Living:  2
Fetal Evaluation

Num Of Fetuses:     1
Fetal Heart         130
Rate(bpm):
Cardiac Activity:   Observed
Presentation:       Cephalic
Placenta:           Posterior, above cervical os
P. Cord Insertion:  Previously Visualized

Amniotic Fluid
AFI FV:      Subjectively within normal limits

AFI Sum(cm)     %Tile       Largest Pocket(cm)
17.39           64

RUQ(cm)       RLQ(cm)       LUQ(cm)        LLQ(cm)
3.42
Gestational Age
LMP:           35w 6d        Date:  10/28/16                 EDD:   08/04/17
Best:          35w 6d     Det. By:  LMP  (10/28/16)          EDD:   08/04/17
Cervix Uterus Adnexa

Cervix
Not visualized (advanced GA >37wks)

Uterus
No abnormality visualized.

Left Ovary
Not visualized. No adnexal mass visualized.

Right Ovary
Not visualized. No adnexal mass visualized.

Cul De Sac:   No free fluid seen.

Adnexa:       No abnormality visualized.
Impression

IUP at 35+6 weeks with abdominal pain
normal fetal movement and cardiac activity
Normal placentation
Normal amniotic fluid
Cephalic presentation
Recommendations

Continue clinical evaluation and management

## 2019-02-20 ENCOUNTER — Emergency Department (HOSPITAL_COMMUNITY): Payer: Medicaid Other

## 2019-02-20 ENCOUNTER — Other Ambulatory Visit: Payer: Self-pay

## 2019-02-20 ENCOUNTER — Emergency Department (HOSPITAL_COMMUNITY)
Admission: EM | Admit: 2019-02-20 | Discharge: 2019-02-20 | Disposition: A | Discharge status: Home / Self Care | Payer: Medicaid Other | Attending: Emergency Medicine | Admitting: Emergency Medicine

## 2019-02-20 ENCOUNTER — Encounter (HOSPITAL_COMMUNITY): Payer: Self-pay

## 2019-02-20 DIAGNOSIS — Z79899 Other long term (current) drug therapy: Secondary | ICD-10-CM | POA: Diagnosis not present

## 2019-02-20 DIAGNOSIS — N898 Other specified noninflammatory disorders of vagina: Secondary | ICD-10-CM | POA: Insufficient documentation

## 2019-02-20 DIAGNOSIS — N72 Inflammatory disease of cervix uteri: Secondary | ICD-10-CM

## 2019-02-20 DIAGNOSIS — O23511 Infections of cervix in pregnancy, first trimester: Secondary | ICD-10-CM | POA: Diagnosis not present

## 2019-02-20 DIAGNOSIS — Z3A01 Less than 8 weeks gestation of pregnancy: Secondary | ICD-10-CM | POA: Diagnosis not present

## 2019-02-20 DIAGNOSIS — J45909 Unspecified asthma, uncomplicated: Secondary | ICD-10-CM | POA: Diagnosis not present

## 2019-02-20 DIAGNOSIS — R109 Unspecified abdominal pain: Secondary | ICD-10-CM

## 2019-02-20 DIAGNOSIS — O3461 Maternal care for abnormality of vagina, first trimester: Secondary | ICD-10-CM | POA: Diagnosis not present

## 2019-02-20 DIAGNOSIS — O210 Mild hyperemesis gravidarum: Secondary | ICD-10-CM

## 2019-02-20 DIAGNOSIS — O9989 Other specified diseases and conditions complicating pregnancy, childbirth and the puerperium: Secondary | ICD-10-CM | POA: Diagnosis present

## 2019-02-20 DIAGNOSIS — O99511 Diseases of the respiratory system complicating pregnancy, first trimester: Secondary | ICD-10-CM | POA: Insufficient documentation

## 2019-02-20 DIAGNOSIS — Z87891 Personal history of nicotine dependence: Secondary | ICD-10-CM | POA: Diagnosis not present

## 2019-02-20 LAB — CBC
HCT: 35.2 % — ABNORMAL LOW (ref 36.0–46.0)
Hemoglobin: 11.5 g/dL — ABNORMAL LOW (ref 12.0–15.0)
MCH: 27.9 pg (ref 26.0–34.0)
MCHC: 32.7 g/dL (ref 30.0–36.0)
MCV: 85.4 fL (ref 80.0–100.0)
Platelets: 230 10*3/uL (ref 150–400)
RBC: 4.12 MIL/uL (ref 3.87–5.11)
RDW: 12.7 % (ref 11.5–15.5)
WBC: 9.6 10*3/uL (ref 4.0–10.5)
nRBC: 0 % (ref 0.0–0.2)

## 2019-02-20 LAB — BASIC METABOLIC PANEL
Anion gap: 13 (ref 5–15)
BUN: 10 mg/dL (ref 6–20)
CO2: 24 mmol/L (ref 22–32)
Calcium: 9.2 mg/dL (ref 8.9–10.3)
Chloride: 100 mmol/L (ref 98–111)
Creatinine, Ser: 0.7 mg/dL (ref 0.44–1.00)
GFR calc Af Amer: >60 mL/min (ref >60)
GFR calc non Af Amer: >60 mL/min (ref >60)
Glucose, Bld: 89 mg/dL (ref 70–99)
Potassium: 3 mmol/L — ABNORMAL LOW (ref 3.5–5.1)
Sodium: 137 mmol/L (ref 135–145)

## 2019-02-20 LAB — WET PREP, GENITAL
Sperm: NONE SEEN
Trich, Wet Prep: NONE SEEN
Yeast Wet Prep HPF POC: NONE SEEN

## 2019-02-20 LAB — HCG, QUANTITATIVE, PREGNANCY: hCG, Beta Chain, Quant, S: 58715 m[IU]/mL — ABNORMAL HIGH (ref <5)

## 2019-02-20 LAB — ABO/RH: ABO/RH(D): A POS

## 2019-02-20 MED ORDER — CEFTRIAXONE SODIUM 250 MG IJ SOLR
250.0000 mg | Freq: Once | INTRAMUSCULAR | Status: AC
  Administered 2019-02-20: 11:00:00 250 mg via INTRAMUSCULAR
  Filled 2019-02-20: qty 250

## 2019-02-20 MED ORDER — LIDOCAINE HCL (PF) 1 % IJ SOLN
INTRAMUSCULAR | Status: AC
  Filled 2019-02-20: qty 5

## 2019-02-20 MED ORDER — ONDANSETRON HCL 4 MG/2ML IJ SOLN
4.0000 mg | Freq: Once | INTRAMUSCULAR | Status: AC
  Administered 2019-02-20: 09:00:00 4 mg via INTRAVENOUS
  Filled 2019-02-20: qty 2

## 2019-02-20 MED ORDER — SODIUM CHLORIDE 0.9 % IV BOLUS
1000.0000 mL | Freq: Once | INTRAVENOUS | Status: AC
  Administered 2019-02-20: 09:00:00 1000 mL via INTRAVENOUS

## 2019-02-20 MED ORDER — PROMETHAZINE HCL 25 MG/ML IJ SOLN
12.5000 mg | Freq: Once | INTRAMUSCULAR | Status: AC
  Administered 2019-02-20: 11:00:00 12.5 mg via INTRAVENOUS
  Filled 2019-02-20: qty 1

## 2019-02-20 MED ORDER — AZITHROMYCIN 250 MG PO TABS
1000.0000 mg | ORAL_TABLET | Freq: Once | ORAL | Status: AC
  Administered 2019-02-20: 11:00:00 1000 mg via ORAL
  Filled 2019-02-20: qty 4

## 2019-02-20 MED ORDER — PRENATAL VITAMINS 28-0.8 MG PO TABS: 1.0000 | ORAL_TABLET | Freq: Every day | ORAL | 0 refills | Status: DC

## 2019-02-20 MED ORDER — MORPHINE SULFATE (PF) 4 MG/ML IV SOLN
4.0000 mg | Freq: Once | INTRAVENOUS | Status: AC
  Administered 2019-02-20: 09:00:00 4 mg via INTRAVENOUS
  Filled 2019-02-20: qty 1

## 2019-02-20 MED ORDER — ONDANSETRON 4 MG PO TBDP: 4.0000 mg | ORAL_TABLET | Freq: Three times a day (TID) | ORAL | 0 refills | Status: DC | PRN

## 2019-02-20 NOTE — ED Provider Notes (Signed)
[facility] EMERGENCY DEPARTMENT Provider Note   CSN: [id number] Arrival date & time: 02/20/19  6295    History   Chief Complaint No chief complaint on file.   HPI Mercedes [name] is a 28 y.o. female.     Pt presents to the ED today with abdominal pain and n/v.  She also said she is [redacted] weeks pregnant.  This is her 4th pregnancy.  She has had hyperemesis gravidarum with prior pregnancies.  She has not seen a doctor for her pregnancy yet.  She denies f/c or cough.     Past Medical History:  Diagnosis Date  . Anemia   . Asthma   . Hypokalemia   . Kidney stone 2018    Patient Active Problem List   Diagnosis Date Noted  . SVD (spontaneous vaginal delivery) 08/08/2017  . Post-dates pregnancy 08/06/2017  . Supervision of high risk pregnancy, antepartum 06/08/2017  . Insufficient prenatal care in third trimester 06/08/2017  . Substance abuse affecting pregnancy in third trimester, antepartum 06/08/2017  . Obesity in pregnancy with antepartum complication 06/08/2017  . Obesity (BMI 30-39.9) 06/08/2017  . Nausea and vomiting of pregnancy, antepartum 06/08/2017  . Anemia in pregnancy 02/27/2016  . Asthma affecting pregnancy, antepartum 02/27/2016    Past Surgical History:  Procedure Laterality Date  . NO PAST SURGERIES    . WISDOM TOOTH EXTRACTION       OB History    Gravida  5   Para  3   Term  3   Preterm  0   AB  1   Living  3     SAB  0   TAB  0   Ectopic  0   Multiple  0   Live Births  3        Obstetric Comments  G1: 2014 3430gm SVD G2: 01/2014 3195gm SVD/2nd         Home Medications    Prior to Admission medications   Medication Sig Start Date End Date Taking? Authorizing Provider  albuterol (PROVENTIL HFA;VENTOLIN HFA) 108 (90 Base) MCG/ACT inhaler Inhale 1-2 puffs into the lungs every 6 (six) hours as needed for wheezing or shortness of breath. 11/23/17   Georgetta Haber, NP  ondansetron (ZOFRAN ODT) 4 MG  disintegrating tablet Take 1 tablet (4 mg total) by mouth every 8 (eight) hours as needed. 02/20/19   Jacalyn Lefevre, MD  ondansetron (ZOFRAN) 4 MG tablet Take 1 tablet (4 mg total) by mouth every 8 (eight) hours as needed for nausea or vomiting. 11/23/17   Georgetta Haber, NP  Prenatal Vit-Fe Fumarate-FA (PRENATAL MULTIVITAMIN) TABS tablet Take 1 tablet by mouth daily at 12 noon.    [provider]  Prenatal Vit-Fe Fumarate-FA (PRENATAL VITAMINS) 28-0.8 MG TABS Take 1 tablet by mouth daily. 02/20/19   Jacalyn Lefevre, MD    Family History Family History  Problem Relation Age of Onset  . Cancer Mother        ovarian  . Asthma Mother     Social History Social History   Tobacco Use  . Smoking status: Former Smoker    Types: Cigarettes  . Smokeless tobacco: Never Used  . Tobacco comment: with +preg test  Substance Use Topics  . Alcohol use: No  . Drug use: Yes    Types: Marijuana, Barbituates    Comment: may 2018 last "smoked"     Allergies   Penicillins   Review of Systems Review of Systems  Gastrointestinal: Positive for abdominal pain, nausea and vomiting.  All other systems reviewed and are negative.    Physical Exam Updated Vital Signs BP (!) 95/59   Pulse (!) 54   Temp 98.5 F (36.9 C) (Oral)   Resp 20   LMP 02/06/2019 (Exact Date)   SpO2 98%   Breastfeeding No   Physical Exam Vitals signs and nursing note reviewed. Exam conducted with a chaperone present.  Constitutional:      Appearance: Normal appearance. She is normal weight.  HENT:     Head: Normocephalic and atraumatic.     Right Ear: External ear normal.     Left Ear: External ear normal.     Nose: Nose normal.     Mouth/Throat:     Mouth: Mucous membranes are dry.  Eyes:     Extraocular Movements: Extraocular movements intact.     Pupils: Pupils are equal, round, and reactive to light.  Neck:     Musculoskeletal: Normal range of motion and neck supple.  Cardiovascular:     Rate  and Rhythm: Normal rate and regular rhythm.     Pulses: Normal pulses.     Heart sounds: Normal heart sounds.  Pulmonary:     Effort: Pulmonary effort is normal.     Breath sounds: Normal breath sounds.  Abdominal:     General: Abdomen is flat.     Palpations: Abdomen is soft.  Genitourinary:    Cervix: Discharge present.     Uterus: Normal.      Adnexa: Right adnexa normal and left adnexa normal.     Comments: Yellowish vaginal d/c Musculoskeletal: Normal range of motion.  Skin:    General: Skin is warm.     Capillary Refill: Capillary refill takes less than 2 seconds.  Neurological:     General: No focal deficit present.     Mental Status: She is alert and oriented to person, place, and time.  Psychiatric:        Mood and Affect: Mood normal.        Behavior: Behavior normal.      ED Treatments / Results  Labs (all labs ordered are listed, but only abnormal results are displayed) Labs Reviewed  WET PREP, GENITAL - Abnormal; Notable for the following components:      Result Value   Clue Cells Wet Prep HPF POC PRESENT (*)    WBC, Wet Prep HPF POC MANY (*)    All other components within normal limits  HCG, QUANTITATIVE, PREGNANCY - Abnormal; Notable for the following components:   hCG, Beta Chain, Quant, S 58,715 (*)    All other components within normal limits  CBC - Abnormal; Notable for the following components:   Hemoglobin 11.5 (*)    HCT 35.2 (*)    All other components within normal limits  BASIC METABOLIC PANEL - Abnormal; Notable for the following components:   Potassium 3.0 (*)    All other components within normal limits  PREGNANCY, URINE  URINALYSIS, ROUTINE W REFLEX MICROSCOPIC  ABO/RH  GC/CHLAMYDIA PROBE AMP ([place]) NOT [facility]    EKG None  Radiology [place] Ob Comp Less 14 Wks  Result Date: 02/20/2019 CLINICAL DATA:  28 year old pregnant female presents with lower abdominal pain for 1 day. Quantitative beta HCG [id number]. EDC by LMP: 10/13/2019,  projecting to an expected gestational age of [redacted] weeks 3 days. EXAM: OBSTETRIC <14 WK US AND TRANSVAGINAL OB US TECHNIQUE: Both transabdominal and transvaginal ultrasound examinations were performed  for complete evaluation of the gestation as well as the maternal uterus, adnexal regions, and pelvic cul-de-sac. Transvaginal technique was performed to assess early pregnancy. COMPARISON:  No prior scans from this gestation. FINDINGS: Intrauterine gestational sac: Single intrauterine gestational sac appears normal in size, shape and position. Yolk sac:  Visualized. Embryo:  Visualized. Cardiac Activity: Visualized. Heart Rate: 128 bpm CRL:  5.9 mm   6 w   3 d                  [place] EDC: 10/13/2019 Subchorionic hemorrhage: No definite perigestational bleed. Curvilinear hypoechogenicity in the uterine cavity surrounding the gestational sac is favored to represent decidual vascularity. Maternal uterus/adnexae: Anteverted uterus, with no uterine fibroids. Right ovary measures 5.4 x 2.8 x 3.8 cm and contains a hypoechoic corpus luteum and a separate simple 2.3 cm follicular cyst. Left ovary measures 2.7 x 1.2 x 1.4 cm and is normal. No suspicious ovarian or adnexal masses. Small volume simple free fluid in the pelvic cul-de-sac. IMPRESSION: 1. Single living intrauterine gestation at 6 weeks 3 days by crown-rump length, concordant with provided menstrual dating. No acute first-trimester gestational abnormality. 2. Simple 2.3 cm right ovarian follicular cyst. No suspicious ovarian or adnexal masses. 3. Nonspecific small volume simple free fluid in the pelvic cul-de-sac. Electronically Signed   By: Delbert Phenix M.D.   On: 02/20/2019 11:28   US Ob Transvaginal  Result Date: 02/20/2019 CLINICAL DATA:  29 year old pregnant female presents with lower abdominal pain for 1 day. Quantitative beta HCG [id number]. EDC by LMP: 10/13/2019, projecting to an expected gestational age of [redacted] weeks 3 days. EXAM: OBSTETRIC <14 WK [place] AND TRANSVAGINAL  OB US TECHNIQUE: Both transabdominal and transvaginal ultrasound examinations were performed for complete evaluation of the gestation as well as the maternal uterus, adnexal regions, and pelvic cul-de-sac. Transvaginal technique was performed to assess early pregnancy. COMPARISON:  No prior scans from this gestation. FINDINGS: Intrauterine gestational sac: Single intrauterine gestational sac appears normal in size, shape and position. Yolk sac:  Visualized. Embryo:  Visualized. Cardiac Activity: Visualized. Heart Rate: 128 bpm CRL:  5.9 mm   6 w   3 d                  [place] EDC: 10/13/2019 Subchorionic hemorrhage: No definite perigestational bleed. Curvilinear hypoechogenicity in the uterine cavity surrounding the gestational sac is favored to represent decidual vascularity. Maternal uterus/adnexae: Anteverted uterus, with no uterine fibroids. Right ovary measures 5.4 x 2.8 x 3.8 cm and contains a hypoechoic corpus luteum and a separate simple 2.3 cm follicular cyst. Left ovary measures 2.7 x 1.2 x 1.4 cm and is normal. No suspicious ovarian or adnexal masses. Small volume simple free fluid in the pelvic cul-de-sac. IMPRESSION: 1. Single living intrauterine gestation at 6 weeks 3 days by crown-rump length, concordant with provided menstrual dating. No acute first-trimester gestational abnormality. 2. Simple 2.3 cm right ovarian follicular cyst. No suspicious ovarian or adnexal masses. 3. Nonspecific small volume simple free fluid in the pelvic cul-de-sac. Electronically Signed   By: Delbert Phenix M.D.   On: 02/20/2019 11:28    Procedures Procedures (including critical care time)  Medications Ordered in ED Medications  lidocaine (PF) (XYLOCAINE) 1 % injection (has no administration in time range)  sodium chloride 0.9 % bolus 1,000 mL (1,000 mLs Intravenous New Bag/Given 02/20/19 0853)  morphine 4 MG/ML injection 4 mg (4 mg Intravenous Given 02/20/19 0853)  ondansetron (ZOFRAN) injection 4 mg (4 mg Intravenous  Given 02/20/19 0852)  promethazine (PHENERGAN) injection 12.5 mg (12.5 mg Intravenous Given 02/20/19 1122)  cefTRIAXone (ROCEPHIN) injection 250 mg (250 mg Intramuscular Given 02/20/19 1122)  azithromycin (ZITHROMAX) tablet 1,000 mg (1,000 mg Oral Given 02/20/19 1122)     Initial Impression / Assessment and Plan / ED Course  I have reviewed the triage vital signs and the nursing notes.  Pertinent labs & imaging results that were available during my care of the patient were reviewed by me and considered in my medical decision making (see chart for details).      Pt is feeling better after IVFs.  US shows IUP with a EDD 10/13/19.    Pt is treated for gonorrhea/chlamydia.  She is d/c home with zofran and prenatal vitamins.  She is instructed to f/u with obgyn.  Return if worse.  Final Clinical Impressions(s) / ED Diagnoses   Final diagnoses:  Abdominal pain  Less than [redacted] weeks gestation of pregnancy  Hyperemesis gravidarum  Cervicitis    ED Discharge Orders         Ordered    Prenatal Vit-Fe Fumarate-FA (PRENATAL VITAMINS) 28-0.8 MG TABS  Daily     02/20/19 1135    ondansetron (ZOFRAN ODT) 4 MG disintegrating tablet  Every 8 hours PRN     02/20/19 1135           Jacalyn Lefevre, MD 02/20/19 1138
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
## 2019-02-21 ENCOUNTER — Encounter (HOSPITAL_COMMUNITY): Payer: Self-pay | Admitting: *Deleted

## 2019-02-21 ENCOUNTER — Other Ambulatory Visit: Payer: Self-pay

## 2019-02-21 ENCOUNTER — Inpatient Hospital Stay (HOSPITAL_COMMUNITY)
Admission: EM | Admit: 2019-02-21 | Discharge: 2019-02-21 | Disposition: A | Discharge status: Home / Self Care | Payer: Medicaid Other | Attending: Obstetrics and Gynecology | Admitting: Obstetrics and Gynecology

## 2019-02-21 DIAGNOSIS — Z880 Allergy status to penicillin: Secondary | ICD-10-CM | POA: Diagnosis not present

## 2019-02-21 DIAGNOSIS — N8301 Follicular cyst of right ovary: Secondary | ICD-10-CM | POA: Insufficient documentation

## 2019-02-21 DIAGNOSIS — R1084 Generalized abdominal pain: Secondary | ICD-10-CM | POA: Diagnosis not present

## 2019-02-21 DIAGNOSIS — O99511 Diseases of the respiratory system complicating pregnancy, first trimester: Secondary | ICD-10-CM | POA: Diagnosis not present

## 2019-02-21 DIAGNOSIS — O219 Vomiting of pregnancy, unspecified: Secondary | ICD-10-CM

## 2019-02-21 DIAGNOSIS — E876 Hypokalemia: Secondary | ICD-10-CM

## 2019-02-21 DIAGNOSIS — O26891 Other specified pregnancy related conditions, first trimester: Secondary | ICD-10-CM | POA: Diagnosis present

## 2019-02-21 DIAGNOSIS — J45909 Unspecified asthma, uncomplicated: Secondary | ICD-10-CM | POA: Diagnosis not present

## 2019-02-21 DIAGNOSIS — Z87442 Personal history of urinary calculi: Secondary | ICD-10-CM | POA: Insufficient documentation

## 2019-02-21 DIAGNOSIS — O3481 Maternal care for other abnormalities of pelvic organs, first trimester: Secondary | ICD-10-CM | POA: Insufficient documentation

## 2019-02-21 DIAGNOSIS — K219 Gastro-esophageal reflux disease without esophagitis: Secondary | ICD-10-CM | POA: Diagnosis not present

## 2019-02-21 DIAGNOSIS — O99611 Diseases of the digestive system complicating pregnancy, first trimester: Secondary | ICD-10-CM | POA: Insufficient documentation

## 2019-02-21 DIAGNOSIS — Z87891 Personal history of nicotine dependence: Secondary | ICD-10-CM | POA: Diagnosis not present

## 2019-02-21 DIAGNOSIS — O99281 Endocrine, nutritional and metabolic diseases complicating pregnancy, first trimester: Secondary | ICD-10-CM | POA: Insufficient documentation

## 2019-02-21 DIAGNOSIS — E860 Dehydration: Secondary | ICD-10-CM

## 2019-02-21 DIAGNOSIS — Z3A01 Less than 8 weeks gestation of pregnancy: Secondary | ICD-10-CM | POA: Diagnosis not present

## 2019-02-21 DIAGNOSIS — Z79899 Other long term (current) drug therapy: Secondary | ICD-10-CM | POA: Diagnosis not present

## 2019-02-21 DIAGNOSIS — O26899 Other specified pregnancy related conditions, unspecified trimester: Secondary | ICD-10-CM

## 2019-02-21 LAB — URINALYSIS, ROUTINE W REFLEX MICROSCOPIC
Bilirubin Urine: NEGATIVE
Glucose, UA: NEGATIVE mg/dL
Hgb urine dipstick: NEGATIVE
Ketones, ur: 80 mg/dL — AB
Nitrite: NEGATIVE
Protein, ur: 30 mg/dL — AB
Specific Gravity, Urine: 1.033 — ABNORMAL HIGH (ref 1.005–1.030)
pH: 6 (ref 5.0–8.0)

## 2019-02-21 LAB — COMPREHENSIVE METABOLIC PANEL
ALT: 10 U/L (ref 0–44)
AST: 14 U/L — ABNORMAL LOW (ref 15–41)
Albumin: 3.5 g/dL (ref 3.5–5.0)
Alkaline Phosphatase: 72 U/L (ref 38–126)
Anion gap: 9 (ref 5–15)
BUN: 9 mg/dL (ref 6–20)
CO2: 28 mmol/L (ref 22–32)
Calcium: 9.2 mg/dL (ref 8.9–10.3)
Chloride: 99 mmol/L (ref 98–111)
Creatinine, Ser: 0.67 mg/dL (ref 0.44–1.00)
GFR calc Af Amer: >60 mL/min (ref >60)
GFR calc non Af Amer: >60 mL/min (ref >60)
Glucose, Bld: 91 mg/dL (ref 70–99)
Potassium: 2.9 mmol/L — ABNORMAL LOW (ref 3.5–5.1)
Sodium: 136 mmol/L (ref 135–145)
Total Bilirubin: 0.9 mg/dL (ref 0.3–1.2)
Total Protein: 7 g/dL (ref 6.5–8.1)

## 2019-02-21 LAB — CBC WITH DIFFERENTIAL/PLATELET
Abs Immature Granulocytes: 0.02 10*3/uL (ref 0.00–0.07)
Basophils Absolute: 0 10*3/uL (ref 0.0–0.1)
Basophils Relative: 0 %
Eosinophils Absolute: 0.1 10*3/uL (ref 0.0–0.5)
Eosinophils Relative: 1 %
HCT: 35.4 % — ABNORMAL LOW (ref 36.0–46.0)
Hemoglobin: 11.6 g/dL — ABNORMAL LOW (ref 12.0–15.0)
Immature Granulocytes: 0 %
Lymphocytes Relative: 16 %
Lymphs Abs: 1.6 10*3/uL (ref 0.7–4.0)
MCH: 27.8 pg (ref 26.0–34.0)
MCHC: 32.8 g/dL (ref 30.0–36.0)
MCV: 84.9 fL (ref 80.0–100.0)
Monocytes Absolute: 0.8 10*3/uL (ref 0.1–1.0)
Monocytes Relative: 7 %
Neutro Abs: 7.9 10*3/uL — ABNORMAL HIGH (ref 1.7–7.7)
Neutrophils Relative %: 76 %
Platelets: 223 10*3/uL (ref 150–400)
RBC: 4.17 MIL/uL (ref 3.87–5.11)
RDW: 12.6 % (ref 11.5–15.5)
WBC: 10.4 10*3/uL (ref 4.0–10.5)
nRBC: 0 % (ref 0.0–0.2)

## 2019-02-21 LAB — GC/CHLAMYDIA PROBE AMP (~~LOC~~) NOT AT ARMC
Chlamydia: NEGATIVE
Neisseria Gonorrhea: NEGATIVE

## 2019-02-21 MED ORDER — FAMOTIDINE 40 MG PO TABS: 40.0000 mg | ORAL_TABLET | Freq: Every day | ORAL | 0 refills | Status: DC

## 2019-02-21 MED ORDER — SODIUM CHLORIDE 0.9 % IV SOLN
8.0000 mg | Freq: Once | INTRAVENOUS | Status: AC
  Administered 2019-02-21: 13:00:00 8 mg via INTRAVENOUS
  Filled 2019-02-21: qty 4

## 2019-02-21 MED ORDER — LACTATED RINGERS IV BOLUS
1000.0000 mL | Freq: Once | INTRAVENOUS | Status: AC
  Administered 2019-02-21: 1000 mL via INTRAVENOUS

## 2019-02-21 MED ORDER — PROMETHAZINE HCL 25 MG PO TABS: 12.5000 mg | ORAL_TABLET | Freq: Four times a day (QID) | ORAL | 0 refills | Status: DC | PRN

## 2019-02-21 MED ORDER — GLYCOPYRROLATE 1 MG PO TABS
1.0000 mg | ORAL_TABLET | Freq: Once | ORAL | Status: AC
  Administered 2019-02-21: 10:00:00 1 mg via ORAL
  Filled 2019-02-21: qty 1

## 2019-02-21 MED ORDER — PROMETHAZINE HCL 25 MG/ML IJ SOLN
25.0000 mg | Freq: Once | INTRAMUSCULAR | Status: AC
  Administered 2019-02-21: 10:00:00 25 mg via INTRAVENOUS
  Filled 2019-02-21: qty 1

## 2019-02-21 MED ORDER — GLYCOPYRROLATE 1 MG PO TABS: 1.0000 mg | ORAL_TABLET | Freq: Three times a day (TID) | ORAL | 0 refills | Status: DC

## 2019-02-21 MED ORDER — POTASSIUM CHLORIDE CRYS ER 20 MEQ PO TBCR: 20.0000 meq | EXTENDED_RELEASE_TABLET | Freq: Every day | ORAL | 0 refills | Status: DC

## 2019-02-21 MED ORDER — FAMOTIDINE IN NACL 20-0.9 MG/50ML-% IV SOLN
20.0000 mg | Freq: Once | INTRAVENOUS | Status: AC
  Administered 2019-02-21: 20 mg via INTRAVENOUS
  Filled 2019-02-21: qty 50

## 2019-02-21 MED ORDER — POTASSIUM CHLORIDE CRYS ER 20 MEQ PO TBCR
40.0000 meq | EXTENDED_RELEASE_TABLET | Freq: Once | ORAL | Status: DC
  Filled 2019-02-21: qty 2

## 2019-02-21 NOTE — MAU Provider Note (Signed)
History     CSN: [id number]  Arrival date and time: 02/21/19 [id number]   First Provider Initiated Contact with Patient 02/21/19 [phone]      Chief Complaint  Patient presents with  . Abdominal Pain  . Emesis   Mercedes [name] is a 28 y.o. [phone] at 3844w4d with documented IUP on US from 02/20/2019 who is here today with abdominal pain and vomiting. She was seen in the ED for this on 02/20/2019. She states that it is worse today. She denies any vaginal bleeding. Patient was treated for GC/CT on 02/20/2019 with rocephin 250mg  IM and Azithromycin 1g PO.   Abdominal Pain  This is a new problem. The current episode started yesterday. The onset quality is sudden. The problem occurs constantly. The problem has been gradually worsening. The pain is located in the generalized abdominal region. The pain is at a severity of 10/10. The abdominal pain does not radiate. Associated symptoms include constipation (last BM "a few days ago") and vomiting. Pertinent negatives include no diarrhea, dysuria, fever or frequency. Nothing aggravates the pain. The pain is relieved by nothing. She has tried nothing for the symptoms.  Emesis   This is a new problem. The current episode started in the past 7 days. Episode frequency: "I can't even count" The problem has been unchanged. The emesis has an appearance of stomach contents. There has been no fever. Associated symptoms include abdominal pain. Pertinent negatives include no chills, diarrhea or fever. Risk factors: pregnancy  Treatments tried: zofran, last taken on 02/20/2019. The treatment provided no relief.    OB History    Gravida  5   Para  3   Term  3   Preterm  0   AB  1   Living  3     SAB  0   TAB  0   Ectopic  0   Multiple  0   Live Births  3        Obstetric Comments  G1: 2014 3430gm SVD G2: 01/2014 3195gm SVD/2nd        Past Medical History:  Diagnosis Date  . Anemia   . Asthma   . Hypokalemia   . Kidney stone 2018    Past  Surgical History:  Procedure Laterality Date  . NO PAST SURGERIES    . WISDOM TOOTH EXTRACTION      Family History  Problem Relation Age of Onset  . Cancer Mother        ovarian  . Asthma Mother     Social History   Tobacco Use  . Smoking status: Former Smoker    Types: Cigarettes  . Smokeless tobacco: Never Used  . Tobacco comment: with +preg test  Substance Use Topics  . Alcohol use: No  . Drug use: Yes    Types: Marijuana, Barbituates    Comment: may 2018 last "smoked"    Allergies:  Allergies  Allergen Reactions  . Penicillins Other (See Comments)    Reaction:  Unknown  Has patient had a PCN reaction causing immediate rash, facial/tongue/throat swelling, SOB or lightheadedness with hypotension: Unknown Has patient had a PCN reaction causing severe rash involving mucus membranes or skin necrosis: Unknown Has patient had a PCN reaction that required hospitalization: Unknown Has patient had a PCN reaction occurring within the last 10 years: No If all of the above answers are "NO", then may proceed with Cephalosporin use.    Medications Prior to Admission  Medication Sig Dispense Refill Last  Dose  . albuterol (PROVENTIL HFA;VENTOLIN HFA) 108 (90 Base) MCG/ACT inhaler Inhale 1-2 puffs into the lungs every 6 (six) hours as needed for wheezing or shortness of breath. 1 Inhaler 0   . ondansetron (ZOFRAN ODT) 4 MG disintegrating tablet Take 1 tablet (4 mg total) by mouth every 8 (eight) hours as needed. 10 tablet 0   . ondansetron (ZOFRAN) 4 MG tablet Take 1 tablet (4 mg total) by mouth every 8 (eight) hours as needed for nausea or vomiting. 10 tablet 0   . Prenatal Vit-Fe Fumarate-FA (PRENATAL MULTIVITAMIN) TABS tablet Take 1 tablet by mouth daily at 12 noon.   08/05/2017 at Unknown time  . Prenatal Vit-Fe Fumarate-FA (PRENATAL VITAMINS) 28-0.8 MG TABS Take 1 tablet by mouth daily. 30 tablet 0     Review of Systems  Constitutional: Negative for chills and fever.   Gastrointestinal: Positive for abdominal pain, constipation (last BM "a few days ago") and vomiting. Negative for diarrhea.  Genitourinary: Negative for dysuria, frequency, pelvic pain, vaginal bleeding and vaginal discharge.   Physical Exam   Blood pressure (!) 103/59, pulse 73, temperature 98.8 F (37.1 C), resp. rate 18, last menstrual period 01/06/2019, not currently breastfeeding.  Physical Exam  Nursing note and vitals reviewed. Constitutional: She is oriented to person, place, and time. She appears well-developed and well-nourished. No distress.  HENT:  Head: Normocephalic.  Cardiovascular: Normal rate.  Respiratory: Effort normal.  GI: Soft. There is no abdominal tenderness. There is no rebound.  Neurological: She is alert and oriented to person, place, and time.  Skin: Skin is warm and dry.  Psychiatric: She has a normal mood and affect.  Patient is tearful and crying    US Ob Comp Less 14 Wks US Ob Transvaginal  Result Date: 02/20/2019 CLINICAL DATA:  28 year old pregnant female presents with lower abdominal pain for 1 day. Quantitative beta HCG [id number]. EDC by LMP: 10/13/2019, projecting to an expected gestational age of [redacted] weeks 3 days. EXAM: OBSTETRIC <14 WK [place] AND TRANSVAGINAL OB US TECHNIQUE: Both transabdominal and transvaginal ultrasound examinations were performed for complete evaluation of the gestation as well as the maternal uterus, adnexal regions, and pelvic cul-de-sac. Transvaginal technique was performed to assess early pregnancy.   COMPARISON:  No prior scans from this gestation.   FINDINGS: Intrauterine gestational sac: Single intrauterine gestational sac appears normal in size, shape and position. Yolk sac:  Visualized. Embryo:  Visualized. Cardiac Activity: Visualized. Heart Rate: 128 bpm CRL:  5.9 mm   6 w   3 d                    [place] EDC: 10/13/2019   Subchorionic hemorrhage: No definite perigestational bleed. Curvilinear hypoechogenicity in the uterine  cavity surrounding the gestational sac is favored to represent decidual vascularity. Maternal uterus/adnexae: Anteverted uterus, with no uterine fibroids. Right ovary measures 5.4 x 2.8 x 3.8 cm and contains a hypoechoic corpus luteum and a separate simple 2.3 cm follicular cyst. Left ovary measures 2.7 x 1.2 x 1.4 cm and is normal. No suspicious ovarian or adnexal masses. Small volume simple free fluid in the pelvic cul-de-sac.   IMPRESSION: 1. Single living intrauterine gestation at 6 weeks 3 days by crown-rump length, concordant with provided menstrual dating. No acute first-trimester gestational abnormality. 2. Simple 2.3 cm right ovarian follicular cyst. No suspicious ovarian or adnexal masses. 3. Nonspecific small volume simple free fluid in the pelvic cul-de-sac. Electronically Signed   By: Jannifer Rodney.D.  On: 02/20/2019 11:28   Results for orders placed or performed during the hospital encounter of 02/21/19 (from the past 24 hour(s))  Urinalysis, Routine w reflex microscopic     Status: Abnormal   Collection Time: 02/21/19  9:01 AM  Result Value Ref Range   Color, Urine YELLOW YELLOW   APPearance HAZY (A) CLEAR   Specific Gravity, Urine 1.033 (H) 1.005 - 1.030   pH 6.0 5.0 - 8.0   Glucose, UA NEGATIVE NEGATIVE mg/dL   Hgb urine dipstick NEGATIVE NEGATIVE   Bilirubin Urine NEGATIVE NEGATIVE   Ketones, ur 80 (A) NEGATIVE mg/dL   Protein, ur 30 (A) NEGATIVE mg/dL   Nitrite NEGATIVE NEGATIVE   Leukocytes,Ua TRACE (A) NEGATIVE   RBC / HPF 0-5 0 - 5 RBC/hpf   WBC, UA 0-5 0 - 5 WBC/hpf   Bacteria, UA RARE (A) NONE SEEN   Squamous Epithelial / LPF 6-10 0 - 5   Mucus PRESENT   CBC with Differential/Platelet     Status: Abnormal   Collection Time: 02/21/19  9:38 AM  Result Value Ref Range   WBC 10.4 4.0 - 10.5 K/uL   RBC 4.17 3.87 - 5.11 MIL/uL   Hemoglobin 11.6 (L) 12.0 - 15.0 g/dL   HCT 16.1 (L) 09.6 - 04.5 %   MCV 84.9 80.0 - 100.0 fL   MCH 27.8 26.0 - 34.0 pg   MCHC 32.8 30.0 -  36.0 g/dL   RDW 40.9 81.1 - 91.4 %   Platelets 223 150 - 400 K/uL   nRBC 0.0 0.0 - 0.2 %   Neutrophils Relative % 76 %   Neutro Abs 7.9 (H) 1.7 - 7.7 K/uL   Lymphocytes Relative 16 %   Lymphs Abs 1.6 0.7 - 4.0 K/uL   Monocytes Relative 7 %   Monocytes Absolute 0.8 0.1 - 1.0 K/uL   Eosinophils Relative 1 %   Eosinophils Absolute 0.1 0.0 - 0.5 K/uL   Basophils Relative 0 %   Basophils Absolute 0.0 0.0 - 0.1 K/uL   Immature Granulocytes 0 %   Abs Immature Granulocytes 0.02 0.00 - 0.07 K/uL  Comprehensive metabolic panel     Status: Abnormal   Collection Time: 02/21/19  9:38 AM  Result Value Ref Range   Sodium 136 135 - 145 mmol/L   Potassium 2.9 (L) 3.5 - 5.1 mmol/L   Chloride 99 98 - 111 mmol/L   CO2 28 22 - 32 mmol/L   Glucose, Bld 91 70 - 99 mg/dL   BUN 9 6 - 20 mg/dL   Creatinine, Ser 7.82 0.44 - 1.00 mg/dL   Calcium 9.2 8.9 - 95.6 mg/dL   Total Protein 7.0 6.5 - 8.1 g/dL   Albumin 3.5 3.5 - 5.0 g/dL   AST 14 (L) 15 - 41 U/L   ALT 10 0 - 44 U/L   Alkaline Phosphatase 72 38 - 126 U/L   Total Bilirubin 0.9 0.3 - 1.2 mg/dL   GFR calc non Af Amer >60 >60 mL/min   GFR calc Af Amer >60 >60 mL/min   Anion gap 9 5 - 15     MAU Course  Procedures  MDM  Patient has had LR bolus x 2 L  Zofran IV  Pepcid IV  Robinul PO  Patient still vomiting after phenergan. Will give dose of Zofran IV    Patient no longer vomiting after zofran. Tolerating PO.   Assessment and Plan   1. Nausea/vomiting in pregnancy   2. Dehydration during  pregnancy   3. Hypokalemia due to excessive gastrointestinal loss of potassium   4. [redacted] weeks gestation of pregnancy   5. Gastroesophageal reflux disease, esophagitis presence not specified    DC home Comfort measures reviewed  1st Trimester precautions  RX: Pepcid 40mg  Qhs, Phenergan PRN #30, Robinul 1mg  TID, K-Dur 20 meq daily x 7 days  Return to MAU as needed FU with OB as planned  Follow-up Information    Department, [facility] Follow up.   Contact information: [address] [place] [address] [phone]            Thressa Sheller DNP, CNM  02/21/19  2:45 PM

## 2019-02-21 NOTE — MAU Note (Signed)
Pt seen in ED yesterday for abd pain . Told she was pregnant and treated for chlamydia. Pain got worse overnight and rating it as a 10/10. Denies any vag bleeding at this time

## 2019-02-21 NOTE — Discharge Instructions (Signed)
[facility] Prenatal Care Providers °         °[facility]  ° Phone: [phone] ° °[facility] @ [facility]  ° Phone: [phone] ° °[facility]      ° Phone: [phone]   °         °[facility] @     ° Phone: [phone] °         °[facility] @ [place] [facility]  ° Phone: [phone] ° °[facility] @ [facility] ° Phone: [phone] °    °[facility] () ° Phone: [phone] ° °

## 2019-02-22 LAB — CULTURE, OB URINE: Culture: <10000 — AB

## 2019-05-30 ENCOUNTER — Encounter (HOSPITAL_COMMUNITY): Payer: Self-pay | Admitting: Emergency Medicine

## 2019-05-30 ENCOUNTER — Emergency Department (HOSPITAL_COMMUNITY)
Admission: EM | Admit: 2019-05-30 | Discharge: 2019-05-30 | Disposition: A | Discharge status: Home / Self Care | Payer: Medicaid Other | Attending: Emergency Medicine | Admitting: Emergency Medicine

## 2019-05-30 ENCOUNTER — Other Ambulatory Visit: Payer: Self-pay

## 2019-05-30 ENCOUNTER — Emergency Department (HOSPITAL_COMMUNITY): Payer: Medicaid Other

## 2019-05-30 DIAGNOSIS — Z87891 Personal history of nicotine dependence: Secondary | ICD-10-CM | POA: Insufficient documentation

## 2019-05-30 DIAGNOSIS — Z79899 Other long term (current) drug therapy: Secondary | ICD-10-CM | POA: Diagnosis not present

## 2019-05-30 DIAGNOSIS — J189 Pneumonia, unspecified organism: Secondary | ICD-10-CM | POA: Diagnosis not present

## 2019-05-30 DIAGNOSIS — I880 Nonspecific mesenteric lymphadenitis: Secondary | ICD-10-CM | POA: Insufficient documentation

## 2019-05-30 DIAGNOSIS — E669 Obesity, unspecified: Secondary | ICD-10-CM | POA: Insufficient documentation

## 2019-05-30 DIAGNOSIS — R1030 Lower abdominal pain, unspecified: Secondary | ICD-10-CM | POA: Diagnosis present

## 2019-05-30 DIAGNOSIS — R10815 Periumbilic abdominal tenderness: Secondary | ICD-10-CM | POA: Diagnosis not present

## 2019-05-30 DIAGNOSIS — Z6839 Body mass index (BMI) 39.0-39.9, adult: Secondary | ICD-10-CM | POA: Insufficient documentation

## 2019-05-30 DIAGNOSIS — Z20822 Contact with and (suspected) exposure to covid-19: Secondary | ICD-10-CM

## 2019-05-30 DIAGNOSIS — R10816 Epigastric abdominal tenderness: Secondary | ICD-10-CM | POA: Insufficient documentation

## 2019-05-30 DIAGNOSIS — K6289 Other specified diseases of anus and rectum: Secondary | ICD-10-CM | POA: Insufficient documentation

## 2019-05-30 LAB — URINALYSIS, ROUTINE W REFLEX MICROSCOPIC
Bilirubin Urine: NEGATIVE
Glucose, UA: NEGATIVE mg/dL
Hgb urine dipstick: NEGATIVE
Ketones, ur: NEGATIVE mg/dL
Leukocytes,Ua: NEGATIVE
Nitrite: NEGATIVE
Protein, ur: NEGATIVE mg/dL
Specific Gravity, Urine: 1.023 (ref 1.005–1.030)
pH: 6 (ref 5.0–8.0)

## 2019-05-30 LAB — COMPREHENSIVE METABOLIC PANEL
ALT: 12 U/L (ref 0–44)
AST: 17 U/L (ref 15–41)
Albumin: 3.6 g/dL (ref 3.5–5.0)
Alkaline Phosphatase: 66 U/L (ref 38–126)
Anion gap: 12 (ref 5–15)
BUN: 15 mg/dL (ref 6–20)
CO2: 21 mmol/L — ABNORMAL LOW (ref 22–32)
Calcium: 9.1 mg/dL (ref 8.9–10.3)
Chloride: 106 mmol/L (ref 98–111)
Creatinine, Ser: 0.72 mg/dL (ref 0.44–1.00)
GFR calc Af Amer: >60 mL/min (ref >60)
GFR calc non Af Amer: >60 mL/min (ref >60)
Glucose, Bld: 98 mg/dL (ref 70–99)
Potassium: 3.6 mmol/L (ref 3.5–5.1)
Sodium: 139 mmol/L (ref 135–145)
Total Bilirubin: 0.4 mg/dL (ref 0.3–1.2)
Total Protein: 6.8 g/dL (ref 6.5–8.1)

## 2019-05-30 LAB — I-STAT BETA HCG BLOOD, ED (MC, WL, AP ONLY): I-stat hCG, quantitative: <5 m[IU]/mL (ref <5)

## 2019-05-30 LAB — CBC
HCT: 37.4 % (ref 36.0–46.0)
Hemoglobin: 11.5 g/dL — ABNORMAL LOW (ref 12.0–15.0)
MCH: 27.4 pg (ref 26.0–34.0)
MCHC: 30.7 g/dL (ref 30.0–36.0)
MCV: 89 fL (ref 80.0–100.0)
Platelets: 193 10*3/uL (ref 150–400)
RBC: 4.2 MIL/uL (ref 3.87–5.11)
RDW: 14.6 % (ref 11.5–15.5)
WBC: 7 10*3/uL (ref 4.0–10.5)
nRBC: 0 % (ref 0.0–0.2)

## 2019-05-30 LAB — WET PREP, GENITAL
Sperm: NONE SEEN
Trich, Wet Prep: NONE SEEN
Yeast Wet Prep HPF POC: NONE SEEN

## 2019-05-30 LAB — LIPASE, BLOOD: Lipase: 33 U/L (ref 11–51)

## 2019-05-30 MED ORDER — ONDANSETRON HCL 4 MG/2ML IJ SOLN
4.0000 mg | Freq: Once | INTRAMUSCULAR | Status: AC
  Administered 2019-05-30: 4 mg via INTRAVENOUS
  Filled 2019-05-30: qty 2

## 2019-05-30 MED ORDER — IOHEXOL 300 MG/ML  SOLN
100.0000 mL | Freq: Once | INTRAMUSCULAR | Status: AC | PRN
  Administered 2019-05-30: 10:00:00 100 mL via INTRAVENOUS

## 2019-05-30 MED ORDER — SODIUM CHLORIDE 0.9% FLUSH
3.0000 mL | Freq: Once | INTRAVENOUS | Status: AC
  Administered 2019-05-30: 3 mL via INTRAVENOUS

## 2019-05-30 MED ORDER — AZITHROMYCIN 250 MG PO TABS: ORAL_TABLET | ORAL | 0 refills | Status: DC

## 2019-05-30 MED ORDER — MORPHINE SULFATE (PF) 4 MG/ML IV SOLN
4.0000 mg | Freq: Once | INTRAVENOUS | Status: AC
  Administered 2019-05-30: 10:00:00 4 mg via INTRAVENOUS
  Filled 2019-05-30: qty 1

## 2019-05-30 MED ORDER — IBUPROFEN 600 MG PO TABS: 600.0000 mg | ORAL_TABLET | Freq: Four times a day (QID) | ORAL | 0 refills | Status: DC | PRN

## 2019-05-30 NOTE — ED Notes (Signed)
Patient declined COVID swab, stating she does not want to a swab

## 2019-05-30 NOTE — ED Triage Notes (Signed)
Pt reports lower right abdominal pain that woke her up out of sleep.  She also complains of rectal pain.

## 2019-05-30 NOTE — Discharge Instructions (Addendum)
You have been evaluated for your condition.  CT shows evidence of pneumonia.  Take antibiotic as prescribed.  A covid-19 test have been sent out.  Please self quarantine, wear mask, and wash your hands regularly while waiting for the result.  Return if you have any concerns.

## 2019-05-30 NOTE — ED Notes (Signed)
Care team and [facility] notified pt was not swabbed d/t refusing until her husband arrived. Mercedes [name] will call her to let her know where she can get swabbed if she wants.

## 2019-05-30 NOTE — ED Provider Notes (Signed)
[facility]   CSN: [id number] Arrival date & time: 05/30/19  0441     History   Chief Complaint Chief Complaint  Patient presents with   Abdominal Pain   Rectal Pain    HPI Cole Burr MedicoMonne Sherwin is a 28 y.o. female.     The history is provided by the patient. No language interpreter was used.  Abdominal Pain    28 year old female with history of obesity, substance abuse, presenting for evaluation of abdominal pain.  Patient developed acute onset of lower abdominal pain that started last night.  Pain is described as a sharp sensation that started in her epigastric region as well as her right lower pain has been persistent, sharp, stabbing, worse with movement.  She tries using bathroom without any relief.  Pain still intensified currently rates as 10 out of 10 prompting her to come here.  No report of fever chills no chest pain shortness of breath productive cough nausea vomiting diarrhea constipation dysuria hematuria vaginal bleeding or vaginal discharge.  No strenuous activities, no recent sexual activity.  She is a G3, P1, last menstruation was approximately 3 weeks ago.  Denies any history of ovarian cysts or uterine fibroid.  Still has an intact appendix.  No specific treatment tried prior to arrival.  No recent sick contact with anyone with COVID-19.  Past Medical History:  Diagnosis Date   Anemia    Asthma    Hypokalemia    Kidney stone 2018    Patient Active Problem List   Diagnosis Date Noted   SVD (spontaneous vaginal delivery) 08/08/2017   Post-dates pregnancy 08/06/2017   Supervision of high risk pregnancy, antepartum 06/08/2017   Insufficient prenatal care in third trimester 06/08/2017   Substance abuse affecting pregnancy in third trimester, antepartum 06/08/2017   Obesity in pregnancy with antepartum complication 06/08/2017   Obesity (BMI 30-39.9) 06/08/2017   Nausea and vomiting of pregnancy,  antepartum 06/08/2017   Anemia in pregnancy 02/27/2016   Asthma affecting pregnancy, antepartum 02/27/2016    Past Surgical History:  Procedure Laterality Date   NO PAST SURGERIES     WISDOM TOOTH EXTRACTION       OB History    Gravida  5   Para  3   Term  3   Preterm  0   AB  1   Living  3     SAB  0   TAB  0   Ectopic  0   Multiple  0   Live Births  3        Obstetric Comments  G1: 2014 3430gm SVD G2: 01/2014 3195gm SVD/2nd         Home Medications    Prior to Admission medications   Medication Sig Start Date End Date Taking? Authorizing Provider  albuterol (PROVENTIL HFA;VENTOLIN HFA) 108 (90 Base) MCG/ACT inhaler Inhale 1-2 puffs into the lungs every 6 (six) hours as needed for wheezing or shortness of breath. 11/23/17   Georgetta HaberBurky, Natalie B, NP  famotidine (PEPCID) 40 MG tablet Take 1 tablet (40 mg total) by mouth at bedtime. 02/21/19   Armando ReichertHogan, Heather D, CNM  glycopyrrolate (ROBINUL) 1 MG tablet Take 1 tablet (1 mg total) by mouth 3 (three) times daily. 02/21/19   Thressa ShellerHogan, Heather D, CNM  ondansetron (ZOFRAN ODT) 4 MG disintegrating tablet Take 1 tablet (4 mg total) by mouth every 8 (eight) hours as needed. 02/20/19   Jacalyn LefevreHaviland, Julie, MD  ondansetron [facility](ZOFRAN) 4  MG tablet Take 1 tablet (4 mg total) by mouth every 8 (eight) hours as needed for nausea or vomiting. 11/23/17   Linus MakoBurky, Natalie B, NP  potassium chloride SA (K-DUR) 20 MEQ tablet Take 1 tablet (20 mEq total) by mouth daily for 7 doses. 02/21/19 02/28/19  Thressa ShellerHogan, Heather D, CNM  Prenatal Vit-Fe Fumarate-FA (PRENATAL MULTIVITAMIN) TABS tablet Take 1 tablet by mouth daily at 12 noon.    [provider]  Prenatal Vit-Fe Fumarate-FA (PRENATAL VITAMINS) 28-0.8 MG TABS Take 1 tablet by mouth daily. 02/20/19   Jacalyn LefevreHaviland, Julie, MD  promethazine (PHENERGAN) 25 MG tablet Take 0.5-1 tablets (12.5-25 mg total) by mouth every 6 (six) hours as needed for nausea or vomiting. 02/21/19   Armando ReichertHogan, Heather D, CNM     Family History Family History  Problem Relation Age of Onset   Cancer Mother        ovarian   Asthma Mother     Social History Social History   Tobacco Use   Smoking status: Former Smoker    Types: Cigarettes   Smokeless tobacco: Never Used   Tobacco comment: with +preg test  Substance Use Topics   Alcohol use: No   Drug use: Yes    Types: Marijuana, Barbituates    Comment: may 2018 last "smoked"     Allergies   Penicillins   Review of Systems Review of Systems  Gastrointestinal: Positive for abdominal pain.  All other systems reviewed and are negative.    Physical Exam Updated Vital Signs BP 128/82 (BP Location: Right Arm)    Pulse 78    Temp 98.2 F (36.8 C) (Oral)    Resp 18    Ht 5\' 4"  (1.626 m)    Wt 104.3 kg    LMP 01/06/2019    SpO2 100%    BMI 39.48 kg/m   Physical Exam Vitals signs and nursing note reviewed.  Constitutional:      General: She is not in acute distress.    Appearance: She is well-developed. She is obese.  HENT:     Head: Atraumatic.  Eyes:     Conjunctiva/sclera: Conjunctivae normal.  Neck:     Musculoskeletal: Neck supple.  Cardiovascular:     Rate and Rhythm: Normal rate and regular rhythm.  Pulmonary:     Effort: Pulmonary effort is normal.     Breath sounds: Normal breath sounds.  Abdominal:     General: Abdomen is flat.     Palpations: Abdomen is soft.     Tenderness: There is abdominal tenderness in the right lower quadrant, epigastric area and suprapubic area. There is no guarding or rebound. Negative signs include Murphy's sign, Rovsing's sign and McBurney's sign.  Genitourinary:    Comments: Pelvic exam performed with permission of pt and female ED tech assist during exam.  External genitalia w/out lesions.  Vaginal vault with normal functional discharge.  Cervix w/out lesions, not friable, GC/Chlamydia and wet prep obtained and sent to lab.  Bimanual exam w/out CMT, uterine or adnexal tenderness  Skin:     Findings: No rash.  Neurological:     Mental Status: She is alert.      ED Treatments / Results  Labs (all labs ordered are listed, but only abnormal results are displayed) Labs Reviewed  WET PREP, GENITAL - Abnormal; Notable for the following components:      Result Value   Clue Cells Wet Prep HPF POC PRESENT (*)    WBC, Wet Prep HPF POC MANY (*)  All other components within normal limits  COMPREHENSIVE METABOLIC PANEL - Abnormal; Notable for the following components:   CO2 21 (*)    All other components within normal limits  CBC - Abnormal; Notable for the following components:   Hemoglobin 11.5 (*)    All other components within normal limits  NOVEL CORONAVIRUS, NAA (HOSPITAL ORDER, SEND-OUT TO REF LAB)  LIPASE, BLOOD  URINALYSIS, ROUTINE W REFLEX MICROSCOPIC  I-STAT BETA HCG BLOOD, ED ([facility], [facility], [facility] ONLY)  GC/CHLAMYDIA PROBE AMP ([place]) NOT AT [facility]    EKG None  Radiology Ct Abdomen Pelvis W Contrast  Result Date: 05/30/2019 CLINICAL DATA:  Abdominal pain, primarily right-sided EXAM: CT ABDOMEN AND PELVIS WITH CONTRAST TECHNIQUE: Multidetector CT imaging of the abdomen and pelvis was performed using the standard protocol following bolus administration of intravenous contrast. CONTRAST:  OMNIPAQUE IOHEXOL 300 MG/ML  SOLN COMPARISON:  None. FINDINGS: Lower chest: There is ground-glass type opacity in the lateral segment right lower lobe. A lesser degree of ground-glass type opacity is noted in the posterior segment left lower lobe. There is no frank consolidation or edema in the lung bases. Hepatobiliary: There is a 4 mm probable cyst in the anterior segment of the right lobe of the liver. There is a 3 mm probable cyst in the left lobe of the liver. No other liver lesions are demonstrable. Gallbladder wall is not appreciably thickened. There is no biliary duct dilatation. Pancreas: There is no pancreatic mass or inflammatory focus. Spleen: No splenic lesions are evident.  Adrenals/Urinary Tract: Adrenals bilaterally appear normal. Kidneys bilaterally show no evident mass or hydronephrosis on either side.w There is a 1 mm calculus in the mid left kidney. There is no evident ureteral calculus on either side. Urinary bladder is midline with wall thickness within normal limits. Stomach/Bowel: There is no appreciable bowel wall or mesenteric thickening. There is moderate stool in the colon. There is no evident bowel obstruction. The terminal ileum appears unremarkable. There is no free air or portal venous air. Vascular/Lymphatic: There is no abdominal aortic aneurysm. No vascular lesions are evident on this study. There is no adenopathy by size criteria in the abdomen or pelvis. Note that there are several subcentimeter right mid to lower abdomen mesenteric lymph nodes. Reproductive: Uterus is anteverted. There are several mass lesions within the uterus, likely leiomyomas. Largest of these apparent leiomyoma seen by CT arises from the anterior fundus measuring 3.5 x 3.5 cm. There are follicles in the left ovary, physiologic. No extrauterine pelvic mass beyond physiologic follicles evident. Other: The appendix appears normal. There is no evident abscess or ascites in the abdomen or pelvis. There is thinning of the rectus muscle in the midline of the upper pelvis. Musculoskeletal: There are no blastic or lytic bone lesions. No intramuscular lesions are evident. IMPRESSION: 1. Ground glass opacity in each lower lobe concerning for atypical organism pneumonia. 2. Appendix appears normal. No bowel obstruction. No abscess in the abdomen or pelvis. 3. Subcentimeter lymph nodes in the right mid to lower abdomen. In the appropriate clinical setting, these lymph nodes may indicate a degree of mesenteric adenitis. No adenopathy by size criteria evident on this study. 4. Nonobstructing 1 mm calculus mid left kidney. No hydronephrosis. No ureteral calculus on either side. Urinary bladder wall  thickness normal. 5.  Apparent leiomyomatous uterus. Electronically Signed   By: Bretta Bang III M.D.   On: 05/30/2019 10:29    Procedures Procedures (including critical care time)  Medications Ordered in  ED Medications  sodium chloride flush (NS) 0.9 % injection 3 mL (3 mLs Intravenous Given 05/30/19 0948)  morphine 4 MG/ML injection 4 mg (4 mg Intravenous Given 05/30/19 0947)  ondansetron (ZOFRAN) injection 4 mg (4 mg Intravenous Given 05/30/19 0947)  iohexol (OMNIPAQUE) 300 MG/ML solution 100 mL (100 mLs Intravenous Contrast Given 05/30/19 1015)     Initial Impression / Assessment and Plan / ED Course  I have reviewed the triage vital signs and the nursing notes.  Pertinent labs & imaging results that were available during my care of the patient were reviewed by me and considered in my medical decision making (see chart for details).        BP 119/71    Pulse 68    Temp 98.2 F (36.8 C) (Oral)    Resp 18    Ht 5\' 4"  (1.626 m)    Wt 104.3 kg    LMP 01/06/2019    SpO2 96%    BMI 39.48 kg/m    Final Clinical Impressions(s) / ED Diagnoses   Final diagnoses:  Mesenteric adenitis  Atypical pneumonia    ED Discharge Orders         Ordered    azithromycin (ZITHROMAX Z-PAK) 250 MG tablet     05/30/19 1055    ibuprofen (ADVIL) 600 MG tablet  Every 6 hours PRN     05/30/19 1055         7:20 AM Patient here with lower abdominal pain.  Pain locates to her right lower quadrant as well as suprapubic region.  Will perform pelvic examination and likely further imaging to rule out acute pathology.  Will provide pain medication as well.  7:53 AM No significant discomfort on pelvic examination to suggest PID or ovarian torsion.  Pt exhibit quite a bit of discomfort while moving about the bed.  CT abd/pelvis ordered.  10:45 AM Wet prep shows evidence of clue cells and many WBC however a finding is not suggestive of PID.  Labs otherwise reassuring.  CT scan of abdomen pelvis shows  evidence of groundglass opacity in each of the lower lobes concerning for atypical organism pneumonia.  There are some subcentimeter lymph nodes in the right mid to lower abdomen as these can represents mesenteric adenitis but no evidence of appendicitis.  Some evidence of uterine fibroid.  Patient denies any chest pain shortness of breath but she does have epigastric pain and a CT scan that shows potential atypical pneumonia.  Because of that, COVID-19 test will be obtained.  Her right lower quadrant abdominal tenderness may reflect mesenteric adenitis.  Patient otherwise is afebrile, no hypoxia, vital signs stable, labs are reassuring therefore I felt she can be discharged home with azithromycin for potential atypical pneumonia.  She will be notified if she test positive for COVID-19.  Encourage patient to self quarantine wearing mask, and washing hands regularly while waiting for her COVID-19 test.  Lindaann PascalShawnte Monne Winegarden was evaluated in Emergency Department on 05/30/2019 for the symptoms described in the history of present illness. She was evaluated in the context of the global COVID-19 pandemic, which necessitated consideration that the patient might be at risk for infection with the SARS-CoV-2 virus that causes COVID-19. Institutional protocols and algorithms that pertain to the evaluation of patients at risk for COVID-19 are in a state of rapid change based on information released by regulatory bodies including the CDC and federal and state organizations. These policies and algorithms were followed during the patient's care in the  ED.  Pt report she does not want to get tested for COVID-19.  Swab test was not obtain.   Domenic Moras, PA-C 05/30/19 1059    Gareth Morgan, MD 06/01/19 1146

## 2019-05-31 LAB — GC/CHLAMYDIA PROBE AMP (~~LOC~~) NOT AT ARMC
Chlamydia: NEGATIVE
Neisseria Gonorrhea: NEGATIVE

## 2020-06-06 IMAGING — US OBSTETRIC <14 WK ULTRASOUND
1 series · 13 of 28 positions shown · non-contrast
Comparison: No prior scans from this gestation.

CLINICAL DATA: 28-year-old pregnant female presents with lower
abdominal pain for 1 day. Quantitative beta HCG [DATE].

EDC by LMP: 10/13/2019, projecting to an expected gestational age of
6 weeks 3 days.
EXAM:
OBSTETRIC <14 WK US AND TRANSVAGINAL OB US
TECHNIQUE: Both transabdominal and transvaginal ultrasound examinations were
performed for complete evaluation of the gestation as well as the
maternal uterus, adnexal regions, and pelvic cul-de-sac.
Transvaginal technique was performed to assess early pregnancy.

[Series 1: obstetric <14 wk ultrasound · 13 of 89 slices shown]
[im 4/89]
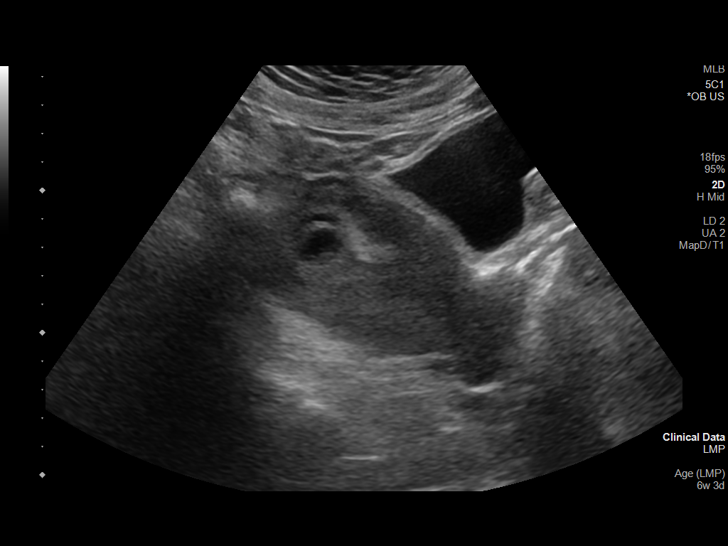
[im 10/89]
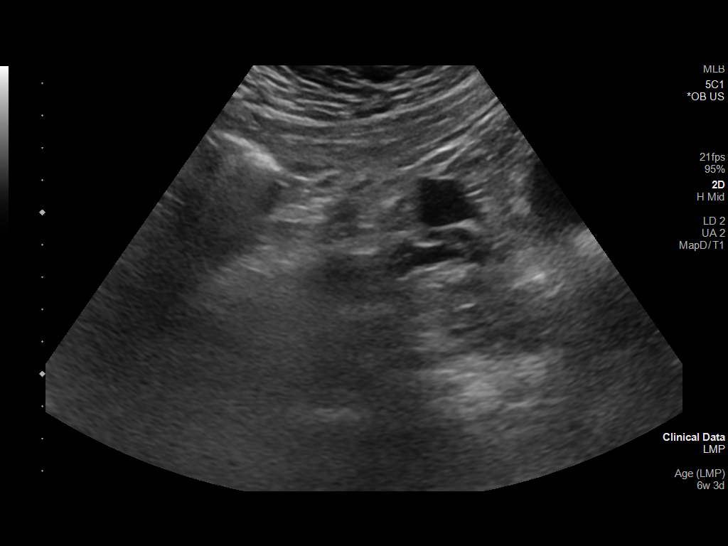
[im 17/89]
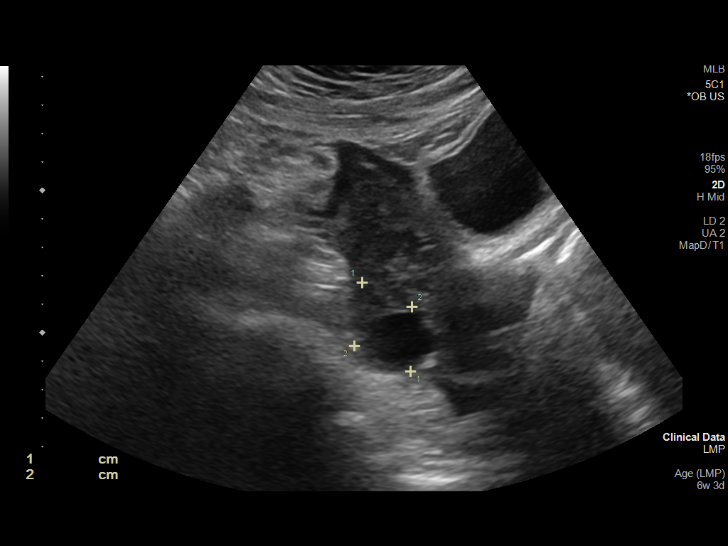
[im 23/89]
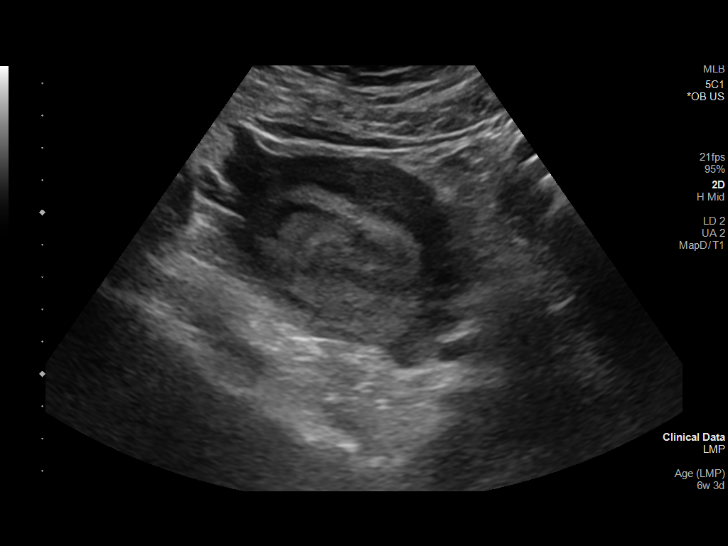
[im 30/89]
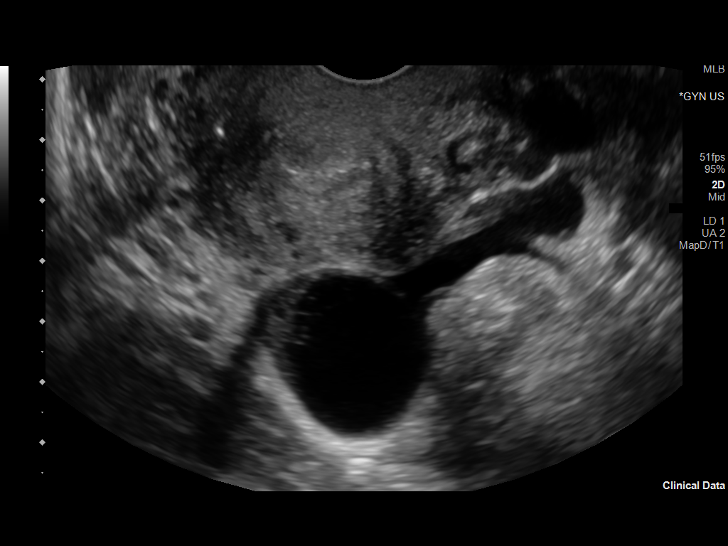
[im 36/89]
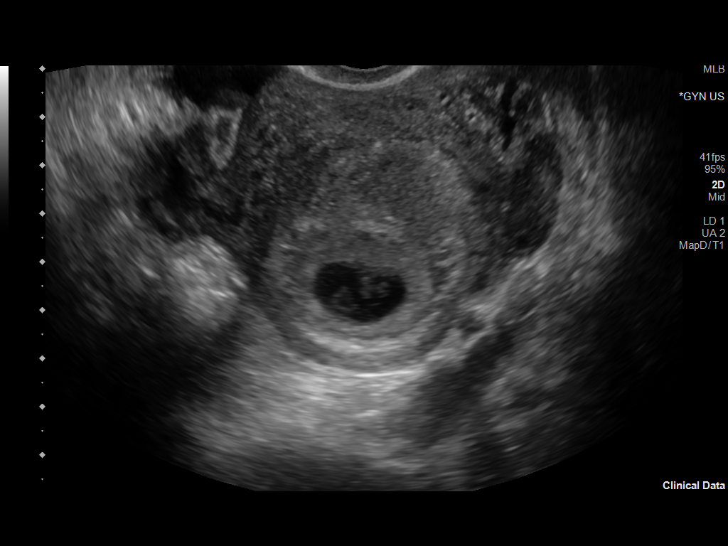
[im 46/89]
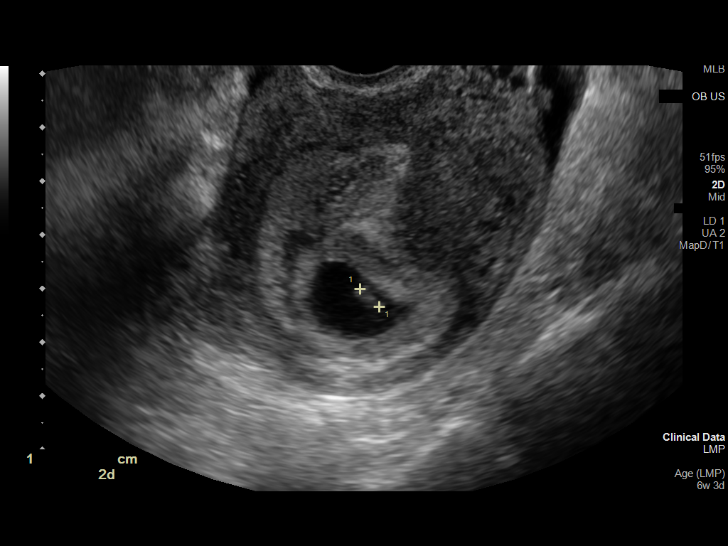
[im 53/89]
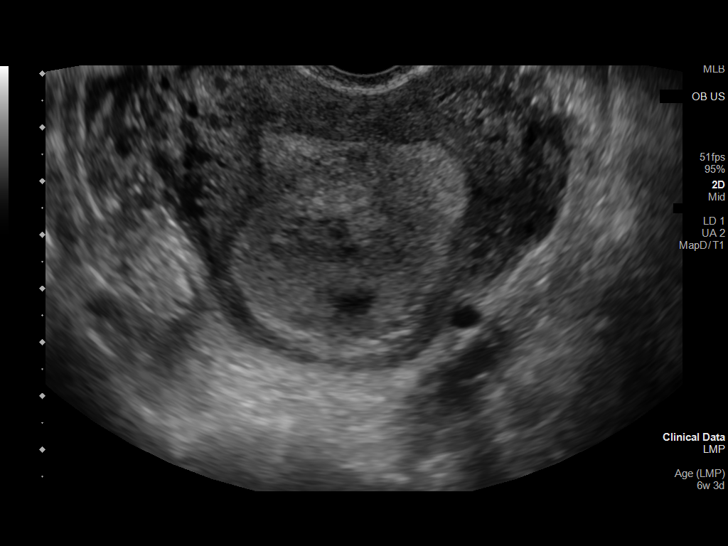
[im 59/89]
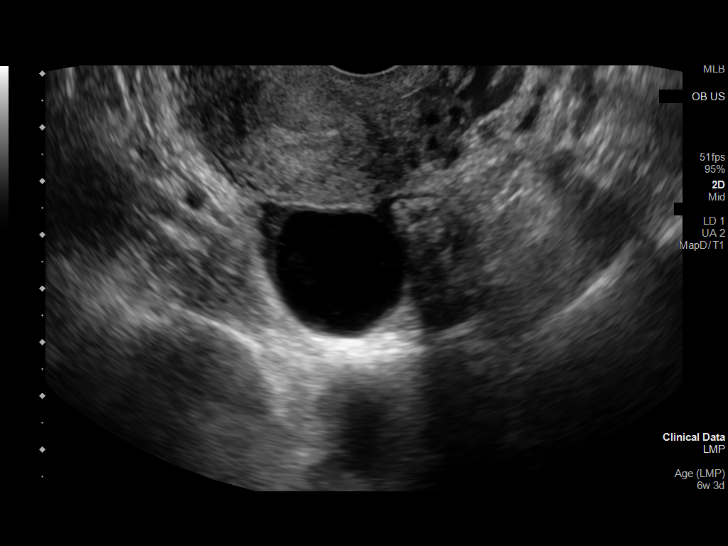
[im 66/89]
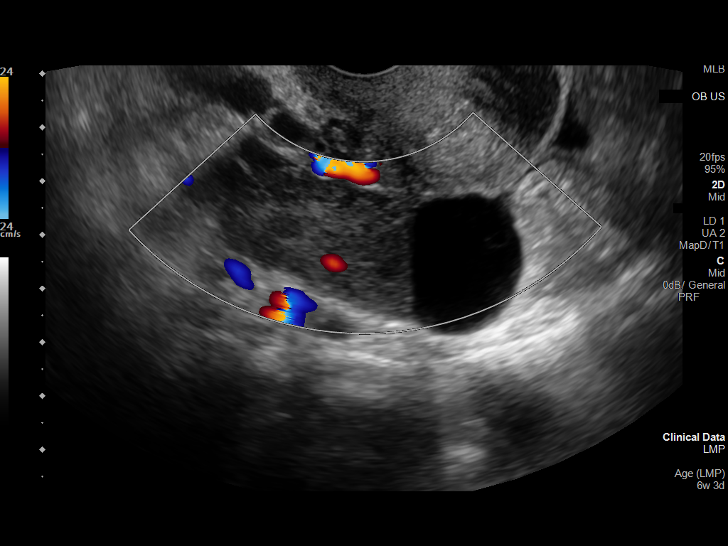
[im 72/89]
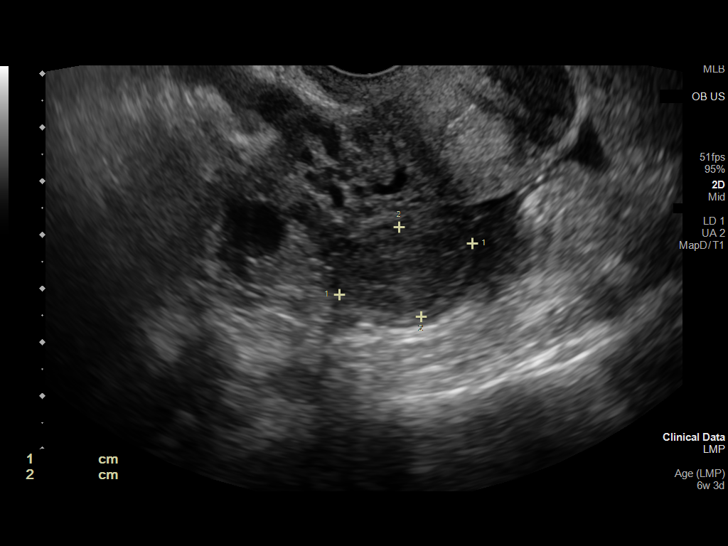
[im 79/89]
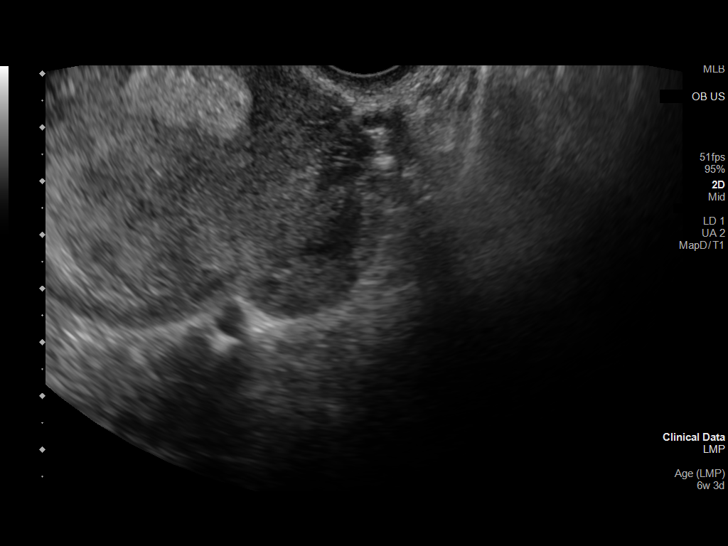
[im 85/89]
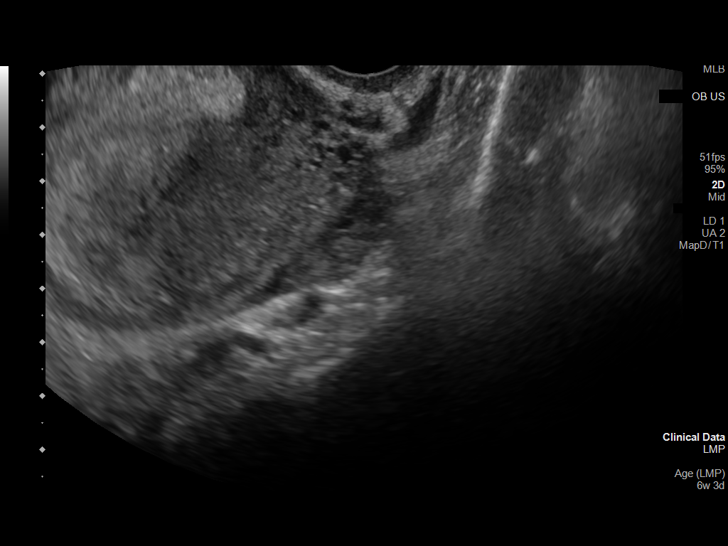

[13 of 28 positions shown; findings below may reference images not displayed]

FINDINGS: Intrauterine gestational sac: Single intrauterine gestational sac
appears normal in size, shape and position.

Yolk sac:  Visualized.

Embryo:  Visualized.

Cardiac Activity: Visualized.

Heart Rate: 128 bpm

CRL:  5.9 mm   6 w   3 d                  US EDC: 10/13/2019

Subchorionic hemorrhage: No definite perigestational bleed.
Curvilinear hypoechogenicity in the uterine cavity surrounding the
gestational sac is favored to represent decidual vascularity.

Maternal uterus/adnexae: Anteverted uterus, with no uterine
fibroids. Right ovary measures 5.4 x 2.8 x 3.8 cm and contains a
hypoechoic corpus luteum and a separate simple 2.3 cm follicular
cyst. Left ovary measures 2.7 x 1.2 x 1.4 cm and is normal. No
suspicious ovarian or adnexal masses. Small volume simple free fluid
in the pelvic cul-de-sac.
IMPRESSION: 1. Single living intrauterine gestation at 6 weeks 3 days by
crown-rump length, concordant with provided menstrual dating. No
acute first-trimester gestational abnormality.
2. Simple 2.3 cm right ovarian follicular cyst. No suspicious
ovarian or adnexal masses.
3. Nonspecific small volume simple free fluid in the pelvic
cul-de-sac.

## 2020-11-27 ENCOUNTER — Encounter (HOSPITAL_COMMUNITY): Payer: Self-pay

## 2020-11-27 ENCOUNTER — Other Ambulatory Visit: Payer: Self-pay

## 2020-11-27 ENCOUNTER — Emergency Department (HOSPITAL_COMMUNITY)
Admission: EM | Admit: 2020-11-27 | Discharge: 2020-11-27 | Disposition: A | Discharge status: Home / Self Care | Payer: Medicaid Other | Attending: Emergency Medicine | Admitting: Emergency Medicine

## 2020-11-27 ENCOUNTER — Emergency Department (HOSPITAL_COMMUNITY): Payer: Medicaid Other

## 2020-11-27 DIAGNOSIS — O23591 Infection of other part of genital tract in pregnancy, first trimester: Secondary | ICD-10-CM | POA: Diagnosis not present

## 2020-11-27 DIAGNOSIS — O219 Vomiting of pregnancy, unspecified: Secondary | ICD-10-CM | POA: Diagnosis not present

## 2020-11-27 DIAGNOSIS — J45909 Unspecified asthma, uncomplicated: Secondary | ICD-10-CM | POA: Insufficient documentation

## 2020-11-27 DIAGNOSIS — Z87891 Personal history of nicotine dependence: Secondary | ICD-10-CM | POA: Diagnosis not present

## 2020-11-27 DIAGNOSIS — O2341 Unspecified infection of urinary tract in pregnancy, first trimester: Secondary | ICD-10-CM | POA: Diagnosis not present

## 2020-11-27 DIAGNOSIS — R102 Pelvic and perineal pain: Secondary | ICD-10-CM

## 2020-11-27 DIAGNOSIS — B9689 Other specified bacterial agents as the cause of diseases classified elsewhere: Secondary | ICD-10-CM | POA: Diagnosis not present

## 2020-11-27 DIAGNOSIS — N760 Acute vaginitis: Secondary | ICD-10-CM

## 2020-11-27 DIAGNOSIS — N390 Urinary tract infection, site not specified: Secondary | ICD-10-CM

## 2020-11-27 DIAGNOSIS — Z3A01 Less than 8 weeks gestation of pregnancy: Secondary | ICD-10-CM | POA: Insufficient documentation

## 2020-11-27 DIAGNOSIS — O26899 Other specified pregnancy related conditions, unspecified trimester: Secondary | ICD-10-CM

## 2020-11-27 DIAGNOSIS — R112 Nausea with vomiting, unspecified: Secondary | ICD-10-CM

## 2020-11-27 LAB — COMPREHENSIVE METABOLIC PANEL
ALT: 10 U/L (ref 0–44)
AST: 12 U/L — ABNORMAL LOW (ref 15–41)
Albumin: 4.2 g/dL (ref 3.5–5.0)
Alkaline Phosphatase: 68 U/L (ref 38–126)
Anion gap: 14 (ref 5–15)
BUN: 14 mg/dL (ref 6–20)
CO2: 20 mmol/L — ABNORMAL LOW (ref 22–32)
Calcium: 9.4 mg/dL (ref 8.9–10.3)
Chloride: 104 mmol/L (ref 98–111)
Creatinine, Ser: 0.65 mg/dL (ref 0.44–1.00)
GFR, Estimated: >60 mL/min (ref >60)
Glucose, Bld: 82 mg/dL (ref 70–99)
Potassium: 3.5 mmol/L (ref 3.5–5.1)
Sodium: 138 mmol/L (ref 135–145)
Total Bilirubin: 1.1 mg/dL (ref 0.3–1.2)
Total Protein: 7.9 g/dL (ref 6.5–8.1)

## 2020-11-27 LAB — URINALYSIS, ROUTINE W REFLEX MICROSCOPIC
Bilirubin Urine: NEGATIVE
Glucose, UA: NEGATIVE mg/dL
Hgb urine dipstick: NEGATIVE
Ketones, ur: 80 mg/dL — AB
Nitrite: NEGATIVE
Protein, ur: 100 mg/dL — AB
Specific Gravity, Urine: 1.035 — ABNORMAL HIGH (ref 1.005–1.030)
Squamous Epithelial / HPF: >50 — ABNORMAL HIGH (ref 0–5)
pH: 5 (ref 5.0–8.0)

## 2020-11-27 LAB — CBC
HCT: 37.6 % (ref 36.0–46.0)
Hemoglobin: 12.6 g/dL (ref 12.0–15.0)
MCH: 29.8 pg (ref 26.0–34.0)
MCHC: 33.5 g/dL (ref 30.0–36.0)
MCV: 88.9 fL (ref 80.0–100.0)
Platelets: 196 10*3/uL (ref 150–400)
RBC: 4.23 MIL/uL (ref 3.87–5.11)
RDW: 13.1 % (ref 11.5–15.5)
WBC: 9.5 10*3/uL (ref 4.0–10.5)
nRBC: 0 % (ref 0.0–0.2)

## 2020-11-27 LAB — WET PREP, GENITAL
Sperm: NONE SEEN
Trich, Wet Prep: NONE SEEN
Yeast Wet Prep HPF POC: NONE SEEN

## 2020-11-27 LAB — LIPASE, BLOOD: Lipase: 25 U/L (ref 11–51)

## 2020-11-27 LAB — I-STAT BETA HCG BLOOD, ED (MC, WL, AP ONLY): I-stat hCG, quantitative: >2000 m[IU]/mL — ABNORMAL HIGH (ref <5)

## 2020-11-27 LAB — HCG, QUANTITATIVE, PREGNANCY: hCG, Beta Chain, Quant, S: 46559 m[IU]/mL — ABNORMAL HIGH (ref <5)

## 2020-11-27 MED ORDER — METOCLOPRAMIDE HCL 5 MG/ML IJ SOLN
10.0000 mg | Freq: Once | INTRAMUSCULAR | Status: AC
  Administered 2020-11-27: 10 mg via INTRAVENOUS
  Filled 2020-11-27: qty 2

## 2020-11-27 MED ORDER — SODIUM CHLORIDE 0.9 % IV BOLUS
1000.0000 mL | Freq: Once | INTRAVENOUS | Status: AC
  Administered 2020-11-27: 1000 mL via INTRAVENOUS

## 2020-11-27 MED ORDER — METRONIDAZOLE 500 MG PO TABS: 500.0000 mg | ORAL_TABLET | Freq: Two times a day (BID) | ORAL | 0 refills | Status: DC

## 2020-11-27 MED ORDER — SODIUM CHLORIDE 0.9 % IV SOLN
1.0000 g | Freq: Once | INTRAVENOUS | Status: AC
  Administered 2020-11-27: 1 g via INTRAVENOUS
  Filled 2020-11-27: qty 10

## 2020-11-27 MED ORDER — PROMETHAZINE HCL 25 MG/ML IJ SOLN
25.0000 mg | Freq: Once | INTRAMUSCULAR | Status: AC
  Administered 2020-11-27: 25 mg via INTRAVENOUS
  Filled 2020-11-27: qty 1

## 2020-11-27 MED ORDER — DIPHENHYDRAMINE HCL 50 MG/ML IJ SOLN
12.5000 mg | Freq: Once | INTRAMUSCULAR | Status: AC
  Administered 2020-11-27: 12.5 mg via INTRAVENOUS
  Filled 2020-11-27: qty 1

## 2020-11-27 MED ORDER — PROMETHAZINE HCL 12.5 MG PO TABS: 12.5000 mg | ORAL_TABLET | Freq: Four times a day (QID) | ORAL | 0 refills | Status: DC | PRN

## 2020-11-27 MED ORDER — PRENATAL VITAMIN 27-0.8 MG PO TABS: ORAL_TABLET | ORAL | 0 refills | Status: DC

## 2020-11-27 MED ORDER — CEPHALEXIN 500 MG PO CAPS: 500.0000 mg | ORAL_CAPSULE | Freq: Two times a day (BID) | ORAL | 0 refills | Status: DC

## 2020-11-27 NOTE — ED Notes (Signed)
Pt given juice at this time for fluid/PO challenge.

## 2020-11-27 NOTE — ED Notes (Addendum)
RN entered room to check on pt and to see if the pt was able to tolerate PO fluids. Pt was not in room and IV was laying on the bed. Pt was not seen in department. Pt was last seen stable, A&O, and in no distress. PA made aware.

## 2020-11-27 NOTE — ED Provider Notes (Signed)
[facility]-EMERGENCY DEPT Provider Note   CSN: [id number] Arrival date & time: 11/27/20  1130    History Chief Complaint  Patient presents with  . Abdominal Pain  . Nausea    Mercedes [name] is a 30 y.o. female G7, P3 who presents for evaluation of lower abdominal pain. Began 3 days ago. Mild dysuria hematuria.  Does not radiate into flanks.  No focal right or left lower quadrant pain.  Has had multiple episodes of NBNB emesis at home. Took a pregnancy test which was positive.  LMP 10/06/2020.  No vaginal bleeding, vaginal discharge, no concerns for STDs. No prior history of ectopic pregnancies.  States she also has burning sensation into her upper abdomen which she states feels similar to her prior reflux.  She is on a medication for this.  She denies fever, chills, chest pain, shortness of breath, cough, upper abdominal pain, diarrhea, constipation.  Denies aggravating or relieving factors.  Does state that she has history of hyperemesis gravidarum in prior pregnancies. Rates pain 6/10.   History obtained from patient and past medical records.  No interpreter used  EDD Jul 14, 2019 with estimate gestational age of [redacted]w[redacted]d based off LMP  HPI     Past Medical History:  Diagnosis Date  . Anemia   . Asthma   . Hypokalemia   . Kidney stone 2018    Patient Active Problem List   Diagnosis Date Noted  . SVD (spontaneous vaginal delivery) 08/08/2017  . Post-dates pregnancy 08/06/2017  . Supervision of high risk pregnancy, antepartum 06/08/2017  . Insufficient prenatal care in third trimester 06/08/2017  . Substance abuse affecting pregnancy in third trimester, antepartum 06/08/2017  . Obesity in pregnancy with antepartum complication 06/08/2017  . Obesity (BMI 30-39.9) 06/08/2017  . Nausea and vomiting of pregnancy, antepartum 06/08/2017  . Anemia in pregnancy 02/27/2016  . Asthma affecting pregnancy, antepartum 02/27/2016    Past Surgical History:   Procedure Laterality Date  . NO PAST SURGERIES    . WISDOM TOOTH EXTRACTION       OB History    Gravida  6   Para  3   Term  3   Preterm  0   AB  1   Living  3     SAB  0   IAB  0   Ectopic  0   Multiple  0   Live Births  3        Obstetric Comments  G1: 2014 3430gm SVD G2: 01/2014 3195gm SVD/2nd        Family History  Problem Relation Age of Onset  . Cancer Mother        ovarian  . Asthma Mother     Social History   Tobacco Use  . Smoking status: Former Smoker    Types: Cigarettes  . Smokeless tobacco: Never Used  . Tobacco comment: with +preg test  Substance Use Topics  . Alcohol use: No  . Drug use: Yes    Types: Marijuana, Barbituates    Comment: may 2018 last "smoked"    Home Medications Prior to Admission medications   Medication Sig Start Date End Date Taking? Authorizing Provider  cephALEXin (KEFLEX) 500 MG capsule Take 1 capsule (500 mg total) by mouth 2 (two) times daily for 7 days. 11/27/20 12/04/20 Yes Henderly, Britni A, PA-C  metroNIDAZOLE (FLAGYL) 500 MG tablet Take 1 tablet (500 mg total) by mouth 2 (two) times daily. 11/27/20  Yes Henderly, Britni A,  PA-C  Prenatal Vit-Fe Fumarate-FA (PRENATAL VITAMIN) 27-0.8 MG TABS Take one tablet daily 11/27/20  Yes Henderly, Britni A, PA-C  promethazine (PHENERGAN) 12.5 MG tablet Take 1 tablet (12.5 mg total) by mouth every 6 (six) hours as needed for nausea or vomiting. 11/27/20  Yes Henderly, Britni A, PA-C  albuterol (PROVENTIL HFA;VENTOLIN HFA) 108 (90 Base) MCG/ACT inhaler Inhale 1-2 puffs into the lungs every 6 (six) hours as needed for wheezing or shortness of breath. 11/23/17   Georgetta Haber, NP  azithromycin (ZITHROMAX Z-PAK) 250 MG tablet 2 po day one, then 1 daily x 4 days 05/30/19   Fayrene Helper, PA-C  famotidine (PEPCID) 40 MG tablet Take 1 tablet (40 mg total) by mouth at bedtime. 02/21/19   Armando Reichert, CNM  glycopyrrolate (ROBINUL) 1 MG tablet Take 1 tablet (1 mg total) by mouth 3  (three) times daily. 02/21/19   Thressa Sheller D, CNM  ibuprofen (ADVIL) 600 MG tablet Take 1 tablet (600 mg total) by mouth every 6 (six) hours as needed. 05/30/19   Fayrene Helper, PA-C  ondansetron (ZOFRAN ODT) 4 MG disintegrating tablet Take 1 tablet (4 mg total) by mouth every 8 (eight) hours as needed. 02/20/19   Jacalyn Lefevre, MD  ondansetron (ZOFRAN) 4 MG tablet Take 1 tablet (4 mg total) by mouth every 8 (eight) hours as needed for nausea or vomiting. 11/23/17   Linus Mako B, NP  potassium chloride SA (K-DUR) 20 MEQ tablet Take 1 tablet (20 mEq total) by mouth daily for 7 doses. 02/21/19 02/28/19  Thressa Sheller D, CNM  Prenatal Vit-Fe Fumarate-FA (PRENATAL MULTIVITAMIN) TABS tablet Take 1 tablet by mouth daily at 12 noon.    [provider]    Allergies    Penicillins  Review of Systems   Review of Systems  Constitutional: Negative.   HENT: Negative.   Respiratory: Negative.   Cardiovascular: Negative.   Gastrointestinal: Positive for abdominal pain (Lower abd pain, diffuse), nausea and vomiting. Negative for abdominal distention, anal bleeding, blood in stool, constipation, diarrhea and rectal pain.  Genitourinary: Positive for dysuria and urgency. Negative for difficulty urinating, hematuria, menstrual problem and pelvic pain.  Musculoskeletal: Negative.   Skin: Negative.   Neurological: Negative.   All other systems reviewed and are negative.   Physical Exam Updated Vital Signs BP 122/68   Pulse 85   Temp 98.5 F (36.9 C) (Oral)   Resp 15   LMP 10/06/2020 (Approximate)   SpO2 100%   Physical Exam Vitals and nursing note reviewed.  Constitutional:      General: She is not in acute distress.    Appearance: She is well-developed and well-nourished. She is not ill-appearing, toxic-appearing or diaphoretic.  HENT:     Head: Normocephalic and atraumatic.     Mouth/Throat:     Mouth: Mucous membranes are moist.  Eyes:     Pupils: Pupils are equal, round, and  reactive to light.  Cardiovascular:     Rate and Rhythm: Normal rate.     Pulses: Intact distal pulses.     Heart sounds: Normal heart sounds.  Pulmonary:     Effort: Pulmonary effort is normal. No respiratory distress.     Breath sounds: Normal breath sounds.  Abdominal:     General: Bowel sounds are normal. There is no distension.     Palpations: Abdomen is soft.     Tenderness: There is abdominal tenderness in the suprapubic area. There is no right CVA tenderness, left CVA tenderness,  guarding or rebound. Negative signs include Murphy's sign and McBurney's sign.     Hernia: No hernia is present.  Genitourinary:    Comments: Normal appearing external female genitalia without rashes or lesions, normal vaginal epithelium. Normal appearing closed cervix. Moderate white discharge in vaginal vault. No cervical petechiae. Cervical os is closed. There is no bleeding noted at the os. No odor. Bimanual: No CMT, nontender.  No palpable adnexal masses or tenderness. Uterus midline and not fixed. Rectovaginal exam was deferred.  No cystocele or rectocele noted. No pelvic lymphadenopathy noted. Wet prep was obtained.  Cultures for gonorrhea and chlamydia collected. Exam performed with chaperone Carley, RN in room. Musculoskeletal:        General: Normal range of motion.     Cervical back: Normal range of motion.  Skin:    General: Skin is warm and dry.     Capillary Refill: Capillary refill takes less than 2 seconds.  Neurological:     General: No focal deficit present.     Mental Status: She is alert and oriented to person, place, and time.  Psychiatric:        Mood and Affect: Mood and affect normal.    ED Results / Procedures / Treatments   Labs (all labs ordered are listed, but only abnormal results are displayed) Labs Reviewed  WET PREP, GENITAL - Abnormal; Notable for the following components:      Result Value   Clue Cells Wet Prep HPF POC PRESENT (*)    WBC, Wet Prep HPF POC MANY (*)     All other components within normal limits  COMPREHENSIVE METABOLIC PANEL - Abnormal; Notable for the following components:   CO2 20 (*)    AST 12 (*)    All other components within normal limits  URINALYSIS, ROUTINE W REFLEX MICROSCOPIC - Abnormal; Notable for the following components:   Color, Urine AMBER (*)    APPearance CLOUDY (*)    Specific Gravity, Urine 1.035 (*)    Ketones, ur 80 (*)    Protein, ur 100 (*)    Leukocytes,Ua LARGE (*)    Bacteria, UA MANY (*)    Squamous Epithelial / LPF >50 (*)    All other components within normal limits  HCG, QUANTITATIVE, PREGNANCY - Abnormal; Notable for the following components:   hCG, Beta Chain, Quant, S 46,559 (*)    All other components within normal limits  I-STAT BETA HCG BLOOD, ED ([facility], [facility], [facility] ONLY) - Abnormal; Notable for the following components:   I-stat hCG, quantitative >2,000.0 (*)    All other components within normal limits  URINE CULTURE  LIPASE, BLOOD  CBC  GC/CHLAMYDIA PROBE AMP ([place]) NOT AT [facility]    EKG None  Radiology US OB LESS THAN 14 WEEKS WITH OB TRANSVAGINAL  Result Date: 11/27/2020 CLINICAL DATA:  Abdominal pain, beta HCG greater than 21,000 EXAM: OBSTETRIC <14 WK [place] AND TRANSVAGINAL OB US TECHNIQUE: Both transabdominal and transvaginal ultrasound examinations were performed for complete evaluation of the gestation as well as the maternal uterus, adnexal regions, and pelvic cul-de-sac. Transvaginal technique was performed to assess early pregnancy. COMPARISON:  None. FINDINGS: Intrauterine gestational sac: Single Yolk sac:  Visualized. Embryo:  Visualized. Cardiac Activity: Visualized. Heart Rate: 145 bpm CRL: 10.0 mm   7 w   0 d                  [place] EDC: 07/16/2021 Subchorionic hemorrhage:  Trace Maternal uterus/adnexae: Right ovary measures  2.0 x 2.5 x 2.2 cm in the left ovary measures 3.5 x 3.1 by 3.8 cm. Within the left adnexa there is a probable corpus luteum cyst measuring up to 2.7 cm in diameter.  Trace free fluid is seen within the cul-de-sac. IMPRESSION: 1. Single live intrauterine pregnancy as above, estimated age 41 weeks and 0 days. 2. Trace subchorionic hemorrhage. 3. Trace pelvic free fluid. Electronically Signed   By: Sharlet SalinaMichael  Triska M.D.   On: 11/27/2020 15:16    Procedures Procedures   Medications Ordered in ED Medications  sodium chloride 0.9 % bolus 1,000 mL (0 mLs Intravenous Stopped 11/27/20 1619)  promethazine (PHENERGAN) injection 25 mg (25 mg Intravenous Given 11/27/20 1402)  cefTRIAXone (ROCEPHIN) 1 g in sodium chloride 0.9 % 100 mL IVPB (0 g Intravenous Stopped 11/27/20 1557)  metoCLOPramide (REGLAN) injection 10 mg (10 mg Intravenous Given 11/27/20 1557)  diphenhydrAMINE (BENADRYL) injection 12.5 mg (12.5 mg Intravenous Given 11/27/20 1557)   ED Course  I have reviewed the triage vital signs and the nursing notes.  Pertinent labs & imaging results that were available during my care of the patient were reviewed by me and considered in my medical decision making (see chart for details).  30 year old G7, P3, lower abdominal pain and emesis in setting of home pregnancy test which was positive.  Multiple episodes of NBNB emesis at home.  Has had some dysuria as well as some frequency.  No CVA tap bilaterally.  Abdomen soft with some very minimal suprapubic tenderness however no focal right or left lower quadrant tenderness.    Work-up started from triage which I personally reviewed and interpreted:  CBC without leukocytosis Metabolic panel, CO2 20, likely due to emesis, no additional electrolyte, renal or liver abnormality Lipase 25 I-STAT hCG greater than 2000 Quant hcg 46,559 UA large leuks, many bacteria however squames greater than 50 will culture, given symptomatic we will give antibiotics, allergy listed to penicillins however review of chart patient has had cephalosporins multiple times.  Will give Rocephin. US with single IUP trace subchorionic hemorrhage  Wet Prep  with BV, may WBC  Patient reassessed. No emesis after phenergan however with mild nausea. GU exam with moderate white dc. No CMT, Adnexal tenderness. Pending US.  Patient reassessed. Discussed US findings. Will plan on PO trial.  Nursing went to reassess patient after PO challenge however patient has eloped form the ED and removed her IV.  Attempted to call patient to notify of BV on wet prep however no answer. Did send in Rx of antiemetic, Abx for UTI and BV.       MDM Rules/Calculators/A&P                         Final Clinical Impression(s) / ED Diagnoses Final diagnoses:  Less than [redacted] weeks gestation of pregnancy  Lower urinary tract infectious disease  Non-intractable vomiting with nausea, unspecified vomiting type  Bacterial vaginosis    Rx / DC Orders ED Discharge Orders         Ordered    cephALEXin (KEFLEX) 500 MG capsule  2 times daily        11/27/20 1627    promethazine (PHENERGAN) 12.5 MG tablet  Every 6 hours PRN        11/27/20 1627    Prenatal Vit-Fe Fumarate-FA (PRENATAL VITAMIN) 27-0.8 MG TABS        11/27/20 1627    metroNIDAZOLE (FLAGYL) 500 MG tablet  2  times daily        11/27/20 1634    Ambulatory referral to Obstetrics / Gynecology        11/27/20 [address] Britni A, PA-C 11/27/20 1636    Terrilee Files, MD 11/28/20 1135

## 2020-11-27 NOTE — ED Notes (Signed)
US at bedside

## 2020-11-27 NOTE — ED Triage Notes (Signed)
Pt presents with c/o abdominal pain and nausea for approx 2 days. Pt reports she found out she was pregant approx 4 days ago. LMP was 10/06/20.

## 2020-11-28 ENCOUNTER — Emergency Department (HOSPITAL_COMMUNITY)
Admission: EM | Admit: 2020-11-28 | Discharge: 2020-11-28 | Disposition: A | Discharge status: Home / Self Care | Payer: Medicaid Other | Attending: Emergency Medicine | Admitting: Emergency Medicine

## 2020-11-28 ENCOUNTER — Encounter (HOSPITAL_COMMUNITY): Payer: Self-pay

## 2020-11-28 ENCOUNTER — Other Ambulatory Visit: Payer: Self-pay

## 2020-11-28 DIAGNOSIS — R110 Nausea: Secondary | ICD-10-CM | POA: Diagnosis present

## 2020-11-28 DIAGNOSIS — Z5321 Procedure and treatment not carried out due to patient leaving prior to being seen by health care provider: Secondary | ICD-10-CM | POA: Insufficient documentation

## 2020-11-28 LAB — URINE CULTURE

## 2020-11-28 NOTE — ED Notes (Signed)
Pt called 3x for room placement. Eloped from waiting area.  

## 2020-11-28 NOTE — ED Triage Notes (Signed)
Pt arrives EMS with c/o nausea. Positive home pregnancy test. Seen for same yesterday.

## 2020-11-29 ENCOUNTER — Other Ambulatory Visit: Payer: Self-pay

## 2020-11-29 ENCOUNTER — Inpatient Hospital Stay (HOSPITAL_COMMUNITY): Payer: Medicaid Other

## 2020-11-29 ENCOUNTER — Encounter (HOSPITAL_COMMUNITY): Payer: Self-pay | Admitting: Obstetrics and Gynecology

## 2020-11-29 ENCOUNTER — Other Ambulatory Visit: Payer: Self-pay | Admitting: Certified Nurse Midwife

## 2020-11-29 ENCOUNTER — Inpatient Hospital Stay (HOSPITAL_COMMUNITY)
Admission: AD | Admit: 2020-11-29 | Discharge: 2020-11-29 | Disposition: A | Discharge status: Home / Self Care | Payer: Medicaid Other | Attending: Obstetrics and Gynecology | Admitting: Obstetrics and Gynecology

## 2020-11-29 DIAGNOSIS — O418X10 Other specified disorders of amniotic fluid and membranes, first trimester, not applicable or unspecified: Secondary | ICD-10-CM

## 2020-11-29 DIAGNOSIS — O99281 Endocrine, nutritional and metabolic diseases complicating pregnancy, first trimester: Secondary | ICD-10-CM | POA: Insufficient documentation

## 2020-11-29 DIAGNOSIS — O208 Other hemorrhage in early pregnancy: Secondary | ICD-10-CM | POA: Insufficient documentation

## 2020-11-29 DIAGNOSIS — O468X1 Other antepartum hemorrhage, first trimester: Secondary | ICD-10-CM

## 2020-11-29 DIAGNOSIS — Z87891 Personal history of nicotine dependence: Secondary | ICD-10-CM | POA: Insufficient documentation

## 2020-11-29 DIAGNOSIS — E860 Dehydration: Secondary | ICD-10-CM | POA: Diagnosis not present

## 2020-11-29 DIAGNOSIS — O26891 Other specified pregnancy related conditions, first trimester: Secondary | ICD-10-CM

## 2020-11-29 DIAGNOSIS — O219 Vomiting of pregnancy, unspecified: Secondary | ICD-10-CM

## 2020-11-29 DIAGNOSIS — Z3A01 Less than 8 weeks gestation of pregnancy: Secondary | ICD-10-CM | POA: Insufficient documentation

## 2020-11-29 DIAGNOSIS — Z880 Allergy status to penicillin: Secondary | ICD-10-CM | POA: Diagnosis not present

## 2020-11-29 DIAGNOSIS — IMO0001 Reserved for inherently not codable concepts without codable children: Secondary | ICD-10-CM

## 2020-11-29 DIAGNOSIS — R109 Unspecified abdominal pain: Secondary | ICD-10-CM | POA: Insufficient documentation

## 2020-11-29 LAB — POCT PREGNANCY, URINE: Preg Test, Ur: POSITIVE — AB

## 2020-11-29 LAB — URINALYSIS, ROUTINE W REFLEX MICROSCOPIC
Glucose, UA: NEGATIVE mg/dL
Hgb urine dipstick: NEGATIVE
Ketones, ur: 80 mg/dL — AB
Nitrite: NEGATIVE
Protein, ur: 100 mg/dL — AB
Specific Gravity, Urine: 1.036 — ABNORMAL HIGH (ref 1.005–1.030)
Squamous Epithelial / HPF: >50 — ABNORMAL HIGH (ref 0–5)
pH: 5 (ref 5.0–8.0)

## 2020-11-29 LAB — WET PREP, GENITAL
Sperm: NONE SEEN
Trich, Wet Prep: NONE SEEN
Yeast Wet Prep HPF POC: NONE SEEN

## 2020-11-29 MED ORDER — FAMOTIDINE 40 MG PO TABS: 40.0000 mg | ORAL_TABLET | Freq: Every day | ORAL | 2 refills | Status: DC

## 2020-11-29 MED ORDER — PROMETHAZINE HCL 25 MG PO TABS: 12.5000 mg | ORAL_TABLET | Freq: Every evening | ORAL | 2 refills | Status: DC | PRN

## 2020-11-29 MED ORDER — LACTATED RINGERS IV BOLUS
1000.0000 mL | Freq: Once | INTRAVENOUS | Status: AC
  Administered 2020-11-29: 1000 mL via INTRAVENOUS

## 2020-11-29 MED ORDER — PROMETHAZINE HCL 25 MG/ML IJ SOLN
12.5000 mg | Freq: Once | INTRAMUSCULAR | Status: AC
  Administered 2020-11-29: 12.5 mg via INTRAVENOUS
  Filled 2020-11-29: qty 1

## 2020-11-29 MED ORDER — M.V.I. ADULT IV INJ
Freq: Once | INTRAVENOUS | Status: AC
  Filled 2020-11-29: qty 1000

## 2020-11-29 MED ORDER — SCOPOLAMINE 1 MG/3DAYS TD PT72
1.0000 | MEDICATED_PATCH | TRANSDERMAL | Status: DC
  Administered 2020-11-29: 1.5 mg via TRANSDERMAL
  Filled 2020-11-29: qty 1

## 2020-11-29 MED ORDER — GLYCOPYRROLATE 2 MG PO TABS: 2.0000 mg | ORAL_TABLET | Freq: Three times a day (TID) | ORAL | 3 refills | Status: DC | PRN

## 2020-11-29 MED ORDER — SCOPOLAMINE 1 MG/3DAYS TD PT72: 1.0000 | MEDICATED_PATCH | TRANSDERMAL | 12 refills | Status: DC

## 2020-11-29 MED ORDER — GLYCOPYRROLATE 0.2 MG/ML IJ SOLN
0.2000 mg | Freq: Once | INTRAMUSCULAR | Status: AC
  Administered 2020-11-29: 0.2 mg via INTRAVENOUS
  Filled 2020-11-29: qty 1

## 2020-11-29 MED ORDER — ONDANSETRON HCL 4 MG/2ML IJ SOLN
4.0000 mg | Freq: Once | INTRAMUSCULAR | Status: AC
  Administered 2020-11-29: 4 mg via INTRAVENOUS
  Filled 2020-11-29: qty 2

## 2020-11-29 MED ORDER — METOCLOPRAMIDE HCL 10 MG PO TABS: 10.0000 mg | ORAL_TABLET | Freq: Three times a day (TID) | ORAL | 2 refills | Status: DC | PRN

## 2020-11-29 MED ORDER — FAMOTIDINE IN NACL 20-0.9 MG/50ML-% IV SOLN
20.0000 mg | Freq: Once | INTRAVENOUS | Status: AC
  Administered 2020-11-29: 20 mg via INTRAVENOUS
  Filled 2020-11-29: qty 50

## 2020-11-29 NOTE — Progress Notes (Signed)
Weekly phenergan infusions scheduled for hyperemesis gravidarum. Message sent to office to get pt scheduled.  Edd Arbour, CNM, MSN, IBCLC Certified Nurse Midwife, [facility]

## 2020-11-29 NOTE — MAU Provider Note (Addendum)
Chief Complaint: Emesis and Nausea   Event Date/Time   First Provider Initiated Contact with Patient 11/29/20 1349      SUBJECTIVE HPI: Mercedes [name] is a 30 y.o. [id number] at [redacted]w[redacted]d by LMP with IUP confirmed by [place] on 11/27/20, who presents to maternity admissions reporting nausea vomiting and abdominal cramping. She had hyperemesis in her previous pregnancy and reports that medications helped but she does not have any for this pregnancy. The pain is in her mid abdomen and low abdomen, is intermittent cramping pain, and is worse with the vomiting. She denies vaginal bleeding, vaginal itching/burning, urinary symptoms, h/a, dizziness, or fever/chills.     Location: low abdomen Quality: cramping Severity: 6/10 on pain scale Duration: 2 days Timing: intermittent Modifying factors: worse with vomiting Associated signs and symptoms: n/v  HPI  Past Medical History:  Diagnosis Date  . Anemia   . Asthma   . Hypokalemia   . Kidney stone 2018   Past Surgical History:  Procedure Laterality Date  . NO PAST SURGERIES    . WISDOM TOOTH EXTRACTION     Social History   Socioeconomic History  . Marital status: Single    Spouse name: Not on file  . Number of children: Not on file  . Years of education: Not on file  . Highest education level: Not on file  Occupational History  . Not on file  Tobacco Use  . Smoking status: Former Smoker    Types: Cigarettes  . Smokeless tobacco: Never Used  . Tobacco comment: with +preg test  Vaping Use  . Vaping Use: Never used  Substance and Sexual Activity  . Alcohol use: No  . Drug use: Yes    Types: Marijuana    Comment: last smoked January 2022  . Sexual activity: Yes    Birth control/protection: None    Comment: Patient wants tubes tied  Other Topics Concern  . Not on file  Social History Narrative  . Not on file   Social Determinants of Health   Financial Resource Strain: Not on file  Food Insecurity: Not on file  Transportation  Needs: Not on file  Physical Activity: Not on file  Stress: Not on file  Social Connections: Not on file  Intimate Partner Violence: Not on file   No current facility-administered medications on file prior to encounter.   Current Outpatient Medications on File Prior to Encounter  Medication Sig Dispense Refill  . albuterol (PROVENTIL HFA;VENTOLIN HFA) 108 (90 Base) MCG/ACT inhaler Inhale 1-2 puffs into the lungs every 6 (six) hours as needed for wheezing or shortness of breath. 1 Inhaler 0  . azithromycin (ZITHROMAX Z-PAK) 250 MG tablet 2 po day one, then 1 daily x 4 days 5 tablet 0  . cephALEXin (KEFLEX) 500 MG capsule Take 1 capsule (500 mg total) by mouth 2 (two) times daily for 7 days. 14 capsule 0  . famotidine (PEPCID) 40 MG tablet Take 1 tablet (40 mg total) by mouth at bedtime. 30 tablet 0  . glycopyrrolate (ROBINUL) 1 MG tablet Take 1 tablet (1 mg total) by mouth 3 (three) times daily. 90 tablet 0  . ibuprofen (ADVIL) 600 MG tablet Take 1 tablet (600 mg total) by mouth every 6 (six) hours as needed. 30 tablet 0  . metroNIDAZOLE (FLAGYL) 500 MG tablet Take 1 tablet (500 mg total) by mouth 2 (two) times daily. 14 tablet 0  . ondansetron (ZOFRAN ODT) 4 MG disintegrating tablet Take 1 tablet (4 mg total) by  mouth every 8 (eight) hours as needed. 10 tablet 0  . ondansetron (ZOFRAN) 4 MG tablet Take 1 tablet (4 mg total) by mouth every 8 (eight) hours as needed for nausea or vomiting. 10 tablet 0  . potassium chloride SA (K-DUR) 20 MEQ tablet Take 1 tablet (20 mEq total) by mouth daily for 7 doses. 7 tablet 0  . Prenatal Vit-Fe Fumarate-FA (PRENATAL MULTIVITAMIN) TABS tablet Take 1 tablet by mouth daily at 12 noon.    . Prenatal Vit-Fe Fumarate-FA (PRENATAL VITAMIN) 27-0.8 MG TABS Take one tablet daily 30 tablet 0  . promethazine (PHENERGAN) 12.5 MG tablet Take 1 tablet (12.5 mg total) by mouth every 6 (six) hours as needed for nausea or vomiting. 30 tablet 0   Allergies  Allergen  Reactions  . Lac Bovis Hives  . Penicillins Other (See Comments)    Reaction:  Unknown  Has patient had a PCN reaction causing immediate rash, facial/tongue/throat swelling, SOB or lightheadedness with hypotension: Unknown Has patient had a PCN reaction causing severe rash involving mucus membranes or skin necrosis: Unknown Has patient had a PCN reaction that required hospitalization: Unknown Has patient had a PCN reaction occurring within the last 10 years: No If all of the above answers are "NO", then may proceed with Cephalosporin use. Unknown reaction Childhood allergy    ROS:  Review of Systems  Constitutional: Negative for chills, fatigue and fever.  Respiratory: Negative for shortness of breath.   Cardiovascular: Negative for chest pain.  Gastrointestinal: Positive for abdominal pain, nausea and vomiting.  Genitourinary: Positive for pelvic pain. Negative for difficulty urinating, dysuria, flank pain, vaginal bleeding, vaginal discharge and vaginal pain.  Neurological: Negative for dizziness and headaches.  Psychiatric/Behavioral: Negative.      I have reviewed patient's Past Medical Hx, Surgical Hx, Family Hx, Social Hx, medications and allergies.   Physical Exam   Patient Vitals for the past 24 hrs:  BP Temp Temp src Pulse Resp SpO2 Height Weight  11/29/20 1726 125/65 98.1 F (36.7 C) Oral 70 18 97 % -- --  11/29/20 1300 121/75 98.8 F (37.1 C) Oral 72 20 98 % -- --  11/29/20 1250 -- -- -- -- -- -- 5\' 4"  (1.626 m) 96.3 kg   Constitutional: Well-developed, well-nourished female in no acute distress.  Cardiovascular: normal rate Respiratory: normal effort GI: Abd soft, non-tender. Pos BS x 4 MS: Extremities nontender, no edema, normal ROM Neurologic: Alert and oriented x 4.  GU: Neg CVAT.  PELVIC EXAM: Vaginal swabs collected via blind swab   LAB RESULTS Results for orders placed or performed during the hospital encounter of 11/29/20 (from the past 24 hour(s))   Pregnancy, urine POC     Status: Abnormal   Collection Time: 11/29/20 12:33 PM  Result Value Ref Range   Preg Test, Ur POSITIVE (A) NEGATIVE  Urinalysis, Routine w reflex microscopic Urine, Clean Catch     Status: Abnormal   Collection Time: 11/29/20  1:50 PM  Result Value Ref Range   Color, Urine AMBER (A) YELLOW   APPearance CLOUDY (A) CLEAR   Specific Gravity, Urine 1.036 (H) 1.005 - 1.030   pH 5.0 5.0 - 8.0   Glucose, UA NEGATIVE NEGATIVE mg/dL   Hgb urine dipstick NEGATIVE NEGATIVE   Bilirubin Urine SMALL (A) NEGATIVE   Ketones, ur 80 (A) NEGATIVE mg/dL   Protein, ur 226 (A) NEGATIVE mg/dL   Nitrite NEGATIVE NEGATIVE   Leukocytes,Ua MODERATE (A) NEGATIVE   RBC / HPF 0-5 0 -  5 RBC/hpf   WBC, UA 11-20 0 - 5 WBC/hpf   Bacteria, UA RARE (A) NONE SEEN   Squamous Epithelial / LPF >50 (H) 0 - 5   Mucus PRESENT   Wet prep, genital     Status: Abnormal   Collection Time: 11/29/20  5:25 PM  Result Value Ref Range   Yeast Wet Prep HPF POC NONE SEEN NONE SEEN   Trich, Wet Prep NONE SEEN NONE SEEN   Clue Cells Wet Prep HPF POC PRESENT (A) NONE SEEN   WBC, Wet Prep HPF POC MANY (A) NONE SEEN   Sperm NONE SEEN        IMAGING US OB Transvaginal  Result Date: 11/29/2020 CLINICAL DATA:  Pain EXAM: TRANSVAGINAL OB ULTRASOUND TECHNIQUE: Transvaginal ultrasound was performed for complete evaluation of the gestation as well as the maternal uterus, adnexal regions, and pelvic cul-de-sac. COMPARISON:  None. FINDINGS: Intrauterine gestational sac: Single Yolk sac:  Visualized. Embryo:  Visualized. Cardiac Activity: Visualized. Heart Rate: 159 bpm MSD:   mm    w     d CRL:   14.2 mm   7 w 5 d                  [place] EDC: 07/13/2021 Subchorionic hemorrhage:  Moderate-sized subchorionic hemorrhage. Maternal uterus/adnexae: No adnexal mass. Small amount of free fluid in the pelvis. IMPRESSION: Seven week 5 day intrauterine pregnancy. Fetal heart rate 159 beats per minute. Moderate-sized subchorionic  hemorrhage. Electronically Signed   By: Charlett Nose M.D.   On: 11/29/2020 19:33   US OB LESS THAN 14 WEEKS WITH OB TRANSVAGINAL  Result Date: 11/27/2020 CLINICAL DATA:  Abdominal pain, beta HCG greater than 21,000 EXAM: OBSTETRIC <14 WK [place] AND TRANSVAGINAL OB US TECHNIQUE: Both transabdominal and transvaginal ultrasound examinations were performed for complete evaluation of the gestation as well as the maternal uterus, adnexal regions, and pelvic cul-de-sac. Transvaginal technique was performed to assess early pregnancy. COMPARISON:  None. FINDINGS: Intrauterine gestational sac: Single Yolk sac:  Visualized. Embryo:  Visualized. Cardiac Activity: Visualized. Heart Rate: 145 bpm CRL: 10.0 mm   7 w   0 d                  [place] EDC: 07/16/2021 Subchorionic hemorrhage:  Trace Maternal uterus/adnexae: Right ovary measures 2.0 x 2.5 x 2.2 cm in the left ovary measures 3.5 x 3.1 by 3.8 cm. Within the left adnexa there is a probable corpus luteum cyst measuring up to 2.7 cm in diameter. Trace free fluid is seen within the cul-de-sac. IMPRESSION: 1. Single live intrauterine pregnancy as above, estimated age 30 weeks and 0 days. 2. Trace subchorionic hemorrhage. 3. Trace pelvic free fluid. Electronically Signed   By: Sharlet Salina M.D.   On: 11/27/2020 15:16    MAU Management/MDM: Orders Placed This Encounter  Procedures  . Culture, OB Urine  . Wet prep, genital  . US OB Transvaginal  . Urinalysis, Routine w reflex microscopic Urine, Clean Catch  . Pregnancy, urine POC    Meds ordered this encounter  Medications  . lactated ringers bolus 1,000 mL  . promethazine (PHENERGAN) injection 12.5 mg  . famotidine (PEPCID) IVPB 20 mg premix  . lactated ringers 1,000 mL with multivitamins adult (INFUVITE ADULT) 10 mL infusion  . metoCLOPramide (REGLAN) 10 MG tablet    Sig: Take 1 tablet (10 mg total) by mouth 3 (three) times daily with meals as needed for nausea.    Dispense:  90  tablet    Refill:  2    Order  Specific Question:   Supervising Provider    Answer:   ERVIN, MICHAEL L [[id number]]  . promethazine (PHENERGAN) 25 MG tablet    Sig: Take 0.5-1 tablets (12.5-25 mg total) by mouth at bedtime as needed for nausea or vomiting.    Dispense:  30 tablet    Refill:  2    Order Specific Question:   Supervising Provider    Answer:   ERVIN, MICHAEL L [[id number]]  . famotidine (PEPCID) 40 MG tablet    Sig: Take 1 tablet (40 mg total) by mouth daily.    Dispense:  30 tablet    Refill:  2    Order Specific Question:   Supervising Provider    Answer:   ERVIN, MICHAEL L [[id number]]  . scopolamine (TRANSDERM-SCOP) 1 MG/3DAYS 1.5 mg  . glycopyrrolate (ROBINUL) injection 0.2 mg  . promethazine (PHENERGAN) injection 12.5 mg  . scopolamine (TRANSDERM-SCOP) 1 MG/3DAYS    Sig: Place 1 patch (1.5 mg total) onto the skin every 3 (three) days.    Dispense:  10 patch    Refill:  12    Order Specific Question:   Supervising Provider    Answer:   Alysia Penna, MICHAEL L [[id number]]  . ondansetron (ZOFRAN) injection 4 mg  . glycopyrrolate (ROBINUL) 2 MG tablet    Sig: Take 1 tablet (2 mg total) by mouth 3 (three) times daily as needed.    Dispense:  30 tablet    Refill:  3    Order Specific Question:   Supervising Provider    Answer:   ERVIN, MICHAEL L [[id number]]    Viable IUP on [place] today.  Moderate SCH, discussed with pt, may be associated with cramping.  Bleeding precautions reviewed. Rx for Reglan, Phenergan, Scopolamine patches, robinul, and Pepcid sent to pharmacy. Zofran IV dose given prior to discharge due to returning nausea at time of discharge.   F/U with early prenatal care, list of providers given. Message sent to establish IV infusions for n/v of pregnancy.  Return to MAU as needed for emergencies.   ASSESSMENT 1. Nausea and vomiting during pregnancy prior to [redacted] weeks gestation   2. Mild dehydration   3. Subchorionic hemorrhage of placenta in first trimester, single or unspecified fetus   4. Abdominal pain during pregnancy  in first trimester     PLAN Discharge home Allergies as of 11/29/2020      Reactions   Lac Bovis Hives   Penicillins Other (See Comments)   Reaction:  Unknown  Has patient had a PCN reaction causing immediate rash, facial/tongue/throat swelling, SOB or lightheadedness with hypotension: Unknown Has patient had a PCN reaction causing severe rash involving mucus membranes or skin necrosis: Unknown Has patient had a PCN reaction that required hospitalization: Unknown Has patient had a PCN reaction occurring within the last 10 years: No If all of the above answers are "NO", then may proceed with Cephalosporin use. Unknown reaction Childhood allergy      Medication List    STOP taking these medications   azithromycin 250 MG tablet Commonly known as: Zithromax Z-Pak   cephALEXin 500 MG capsule Commonly known as: KEFLEX   ibuprofen 600 MG tablet Commonly known as: ADVIL   metroNIDAZOLE 500 MG tablet Commonly known as: FLAGYL   ondansetron 4 MG disintegrating tablet Commonly known as: Zofran ODT   ondansetron 4 MG tablet Commonly known as: Zofran   potassium chloride SA 20 MEQ tablet  Commonly known as: KLOR-CON   prenatal multivitamin Tabs tablet     TAKE these medications   albuterol 108 (90 Base) MCG/ACT inhaler Commonly known as: VENTOLIN HFA Inhale 1-2 puffs into the lungs every 6 (six) hours as needed for wheezing or shortness of breath.   famotidine 40 MG tablet Commonly known as: PEPCID Take 1 tablet (40 mg total) by mouth daily. What changed: when to take this   glycopyrrolate 2 MG tablet Commonly known as: ROBINUL Take 1 tablet (2 mg total) by mouth 3 (three) times daily as needed. What changed:   medication strength  how much to take  when to take this  reasons to take this   metoCLOPramide 10 MG tablet Commonly known as: REGLAN Take 1 tablet (10 mg total) by mouth 3 (three) times daily with meals as needed for nausea.   Prenatal Vitamin 27-0.8  MG Tabs Take one tablet daily   promethazine 25 MG tablet Commonly known as: PHENERGAN Take 0.5-1 tablets (12.5-25 mg total) by mouth at bedtime as needed for nausea or vomiting. What changed:   medication strength  how much to take  when to take this   scopolamine 1 MG/3DAYS Commonly known as: TRANSDERM-SCOP Place 1 patch (1.5 mg total) onto the skin every 3 (three) days. Start taking on: December 02, 2020       Follow-up Information    [facility].   Specialty: Obstetrics and Gynecology Why: Or provider of your choice, see list provided Contact information: [address] [place] [phone]              Sharen CounterLisa Leftwich-Kirby Certified Nurse-Midwife 11/29/2020  8:51 PM

## 2020-11-29 NOTE — MAU Note (Signed)
Presents with N/V, states can't keep anything down.  States been vomiting x5 days.  LPM 10/06/2021.  +HPT.

## 2020-11-29 NOTE — Discharge Instructions (Signed)
Prenatal Care Providers           Center for [facility]  [address] [phone]  Center for [facility] @ [facility]   [address]  [phone]  Center For [facility] @ [facility]       [address] [phone]            Center for [facility] @ [place]     [phone]          Center for [facility] @ [facility]   [address] [phone]  Center for [facility] @ [facility]  [address] [phone]     Center for [facility] @ [address] [place])  [address]   [phone]     [facility]  Phone: [phone]  [facility]  Phone: [phone]  [facility] Phone: [phone]  [facility] Phone: [phone]  [facility] Phone: [phone]  [facility] Phone: [phone]  [facility]  Phone: [phone]   Morning Sickness  Morning sickness is when a woman feels nauseous during pregnancy. This nauseous feeling may or may not come with vomiting. It often occurs in the morning, but it can be a problem at any time of day. Morning sickness is most common during the first trimester. In some cases, it may continue throughout pregnancy. Although morning sickness is unpleasant, it is usually harmless unless the woman develops severe and continual vomiting (hyperemesis gravidarum), a condition that requires more intense treatment. What are the causes? The exact cause of this condition is not known, but it seems to be related to normal hormonal changes that occur in pregnancy. What increases the risk? You are more likely to develop this condition if:  You experienced nausea or vomiting before your pregnancy.  You had morning sickness during a previous pregnancy.  You are pregnant with more  than one baby, such as twins. What are the signs or symptoms? Symptoms of this condition include:  Nausea.  Vomiting. How is this diagnosed? This condition is usually diagnosed based on your signs and symptoms. How is this treated? In many cases, treatment is not needed for this condition. Making some changes to what you eat may help to control symptoms. Your health care provider may also prescribe or recommend:  Vitamin B6 supplements.  Anti-nausea medicines.  Ginger. Follow these instructions at home: Medicines  Take over-the-counter and prescription medicines only as told by your health care provider. Do not use any prescription, over-the-counter, or herbal medicines for morning sickness without first talking with your health care provider.  Take multivitamins before getting pregnant. This can prevent or decrease the severity of morning sickness in most women. Eating and drinking  Eat a piece of dry toast or crackers before getting out of bed in the morning.  Eat 5 or 6 small meals a day.  Eat dry and bland foods, such as rice or a baked potato. Foods that are high in carbohydrates are often helpful.  Avoid greasy, fatty, and spicy foods.  Have someone cook for you if the smell of any food causes nausea and vomiting.  If you feel nauseous after taking prenatal vitamins, take the vitamins at night or with a snack.  Eat a protein snack between meals if you are hungry. Nuts, yogurt, and cheese are good options.  Drink fluids throughout  the day.  Try ginger ale made with real ginger, ginger tea made from fresh grated ginger, or ginger candies. General instructions  Do not use any products that contain nicotine or tobacco. These products include cigarettes, chewing tobacco, and vaping devices, such as e-cigarettes. If you need help quitting, ask your health care provider.  Get an air purifier to keep the air in your house free of odors.  Get plenty of fresh air.  Try  to avoid odors that trigger your nausea.  Consider trying these methods to help relieve symptoms: ? Wearing an acupressure wristband. These wristbands are often worn for seasickness. ? Acupuncture. Contact a health care provider if:  Your home remedies are not working and you need medicine.  You feel dizzy or light-headed.  You are losing weight. Get help right away if:  You have persistent and uncontrolled nausea and vomiting.  You faint.  You have severe pain in your abdomen. Summary  Morning sickness is when a woman feels nauseous during pregnancy. This nauseous feeling may or may not come with vomiting.  Morning sickness is most common during the first trimester.  It often occurs in the morning, but it can be a problem at any time of day.  In many cases, treatment is not needed for this condition. Making some changes to what you eat may help to control symptoms. This information is not intended to replace advice given to you by your health care provider. Make sure you discuss any questions you have with your health care provider. Document Revised: 05/25/2020 Document Reviewed: 05/04/2020 Elsevier Patient Education  2021 [employer].

## 2020-11-29 NOTE — Progress Notes (Signed)
U/S done by L. Leftwich-Kirby, CNM secondary pt c/o abdominal pain.

## 2020-11-30 LAB — GC/CHLAMYDIA PROBE AMP (~~LOC~~) NOT AT ARMC
Chlamydia: NEGATIVE
Comment: NEGATIVE
Comment: NORMAL
Neisseria Gonorrhea: NEGATIVE

## 2020-11-30 LAB — CULTURE, OB URINE

## 2020-12-01 ENCOUNTER — Telehealth: Payer: Self-pay | Admitting: Lactation Services

## 2020-12-01 NOTE — Telephone Encounter (Signed)
Called Short Stay Unit and Scheduled patients first Phenergan infusion for Monday 2/14 at 10:00 am.   Called patient to inform her of date, time and location of first infusion. Answering machine said the phone belonged to "[place]". LM to have patient call the office for appointment information. Called Significant Other phone number and received message that call cannot be completed at this time.   Will try to call once more and then send letter.

## 2020-12-01 NOTE — Telephone Encounter (Signed)
-----   Message from Bernerd Limbo, [place] sent at 11/29/2020  9:17 PM EST ----- Regarding: Phenergan Infusions Hello! Can you please get this patient scheduled for phenergan infusions every Monday morning at 10am? The order has been put in for the next 16wks starting next Monday. Let me know if you need anything else, thank you!

## 2020-12-01 NOTE — Telephone Encounter (Signed)
Left message on voicemail stating that we have scheduled her an appt for Phenergan infusion at Short Stay on Monday 12/07/20 @ 1000.  If you could please give the office a return call back.  I am sending a letter with appt information to the address on file.    Leonette Nutting  12/01/20

## 2020-12-07 ENCOUNTER — Inpatient Hospital Stay (HOSPITAL_COMMUNITY)
Admission: RE | Admit: 2020-12-07 | Discharge: 2020-12-07 | Disposition: A | Discharge status: Home / Self Care | Payer: Medicaid Other | Source: Ambulatory Visit | Attending: Certified Nurse Midwife | Admitting: Certified Nurse Midwife

## 2020-12-07 NOTE — Progress Notes (Signed)
Pt was a no show/no call for IVF appt in [facility] - subsequent appts canceled per unit policy - pt will need to call to reschedule

## 2020-12-14 ENCOUNTER — Ambulatory Visit (HOSPITAL_COMMUNITY): Payer: Medicaid Other

## 2020-12-23 ENCOUNTER — Telehealth: Payer: Self-pay | Admitting: Advanced Practice Midwife

## 2020-12-23 NOTE — Telephone Encounter (Signed)
Attempted to reach patient about getting scheduled for prenatal care. Left a message for her to call the office.

## 2020-12-31 ENCOUNTER — Ambulatory Visit: Payer: Medicaid Other | Admitting: Obstetrics & Gynecology

## 2020-12-31 ENCOUNTER — Telehealth: Payer: Self-pay

## 2021-01-01 NOTE — Telephone Encounter (Signed)
Called pt to follow up on missed appt yesterday for follow up to [facility]. VM left with callback number to reschedule. MyChart message sent.

## 2021-04-11 ENCOUNTER — Emergency Department (HOSPITAL_COMMUNITY): Payer: Medicaid Other

## 2021-04-11 ENCOUNTER — Encounter (HOSPITAL_COMMUNITY): Payer: Self-pay | Admitting: Emergency Medicine

## 2021-04-11 ENCOUNTER — Emergency Department (HOSPITAL_COMMUNITY)
Admission: EM | Admit: 2021-04-11 | Discharge: 2021-04-11 | Disposition: A | Discharge status: Home / Self Care | Payer: Medicaid Other | Attending: Emergency Medicine | Admitting: Emergency Medicine

## 2021-04-11 DIAGNOSIS — R102 Pelvic and perineal pain: Secondary | ICD-10-CM | POA: Diagnosis not present

## 2021-04-11 DIAGNOSIS — J45909 Unspecified asthma, uncomplicated: Secondary | ICD-10-CM | POA: Insufficient documentation

## 2021-04-11 DIAGNOSIS — O4690 Antepartum hemorrhage, unspecified, unspecified trimester: Secondary | ICD-10-CM

## 2021-04-11 DIAGNOSIS — Z3A00 Weeks of gestation of pregnancy not specified: Secondary | ICD-10-CM | POA: Insufficient documentation

## 2021-04-11 DIAGNOSIS — O99511 Diseases of the respiratory system complicating pregnancy, first trimester: Secondary | ICD-10-CM | POA: Insufficient documentation

## 2021-04-11 DIAGNOSIS — O26851 Spotting complicating pregnancy, first trimester: Secondary | ICD-10-CM | POA: Insufficient documentation

## 2021-04-11 DIAGNOSIS — Z87891 Personal history of nicotine dependence: Secondary | ICD-10-CM | POA: Insufficient documentation

## 2021-04-11 LAB — WET PREP, GENITAL
Sperm: NONE SEEN
Trich, Wet Prep: NONE SEEN
Yeast Wet Prep HPF POC: NONE SEEN

## 2021-04-11 LAB — URINALYSIS, ROUTINE W REFLEX MICROSCOPIC
Bacteria, UA: NONE SEEN
Bilirubin Urine: NEGATIVE
Glucose, UA: NEGATIVE mg/dL
Ketones, ur: NEGATIVE mg/dL
Leukocytes,Ua: NEGATIVE
Nitrite: NEGATIVE
Protein, ur: NEGATIVE mg/dL
Specific Gravity, Urine: 1.019 (ref 1.005–1.030)
pH: 5 (ref 5.0–8.0)

## 2021-04-11 LAB — HIV ANTIBODY (ROUTINE TESTING W REFLEX): HIV Screen 4th Generation wRfx: NONREACTIVE

## 2021-04-11 LAB — CBC WITH DIFFERENTIAL/PLATELET
Abs Immature Granulocytes: 0.01 10*3/uL (ref 0.00–0.07)
Basophils Absolute: 0 10*3/uL (ref 0.0–0.1)
Basophils Relative: 0 %
Eosinophils Absolute: 0.1 10*3/uL (ref 0.0–0.5)
Eosinophils Relative: 2 %
HCT: 37.9 % (ref 36.0–46.0)
Hemoglobin: 12.3 g/dL (ref 12.0–15.0)
Immature Granulocytes: 0 %
Lymphocytes Relative: 34 %
Lymphs Abs: 2.2 10*3/uL (ref 0.7–4.0)
MCH: 29.2 pg (ref 26.0–34.0)
MCHC: 32.5 g/dL (ref 30.0–36.0)
MCV: 90 fL (ref 80.0–100.0)
Monocytes Absolute: 0.5 10*3/uL (ref 0.1–1.0)
Monocytes Relative: 8 %
Neutro Abs: 3.7 10*3/uL (ref 1.7–7.7)
Neutrophils Relative %: 56 %
Platelets: 163 10*3/uL (ref 150–400)
RBC: 4.21 MIL/uL (ref 3.87–5.11)
RDW: 13 % (ref 11.5–15.5)
WBC: 6.6 10*3/uL (ref 4.0–10.5)
nRBC: 0 % (ref 0.0–0.2)

## 2021-04-11 LAB — I-STAT BETA HCG BLOOD, ED (MC, WL, AP ONLY): I-stat hCG, quantitative: 911.5 m[IU]/mL — ABNORMAL HIGH (ref <5)

## 2021-04-11 LAB — COMPREHENSIVE METABOLIC PANEL
ALT: 10 U/L (ref 0–44)
AST: 13 U/L — ABNORMAL LOW (ref 15–41)
Albumin: 3.8 g/dL (ref 3.5–5.0)
Alkaline Phosphatase: 62 U/L (ref 38–126)
Anion gap: 5 (ref 5–15)
BUN: 12 mg/dL (ref 6–20)
CO2: 22 mmol/L (ref 22–32)
Calcium: 8.8 mg/dL — ABNORMAL LOW (ref 8.9–10.3)
Chloride: 107 mmol/L (ref 98–111)
Creatinine, Ser: 0.56 mg/dL (ref 0.44–1.00)
GFR, Estimated: >60 mL/min (ref >60)
Glucose, Bld: 81 mg/dL (ref 70–99)
Potassium: 3.8 mmol/L (ref 3.5–5.1)
Sodium: 134 mmol/L — ABNORMAL LOW (ref 135–145)
Total Bilirubin: 0.9 mg/dL (ref 0.3–1.2)
Total Protein: 7.2 g/dL (ref 6.5–8.1)

## 2021-04-11 LAB — ABO/RH: ABO/RH(D): A POS

## 2021-04-11 MED ORDER — RHO D IMMUNE GLOBULIN 1500 UNIT/2ML IJ SOSY
300.0000 ug | PREFILLED_SYRINGE | Freq: Once | INTRAMUSCULAR | Status: DC
  Filled 2021-04-11: qty 2

## 2021-04-11 NOTE — ED Notes (Signed)
US at bedside

## 2021-04-11 NOTE — Discharge Instructions (Addendum)
Your ultrasound today did not show a fetus in the uterus.  This may be because it is too early to see or due to early miscarriage. You will need your hormone level rechecked in 2 days.  This can be done at her OB/GYN clinic or at the MAU. Go to the MAU as needed for further pregnancy concerns including severe worsening pain, increasing bleeding, or fever. Return to the emergency room with any new, worsening, concerning symptoms.

## 2021-04-11 NOTE — ED Notes (Signed)
Pt noted to be walking out of the department as this RN was returning to the floor.  ED staff report no one actually discharged her and rho injection not given.

## 2021-04-11 NOTE — ED Provider Notes (Signed)
[facility]-EMERGENCY DEPT Provider Note   CSN: [id number] Arrival date & time: 04/11/21  1128     History Chief Complaint  Patient presents with   Vaginal Bleeding   Pregnant    Mercedes [name] is a 30 y.o. female presenting for evaluation of of vaginal bleeding, lower abdominal pain.   Patient states that when she woke up this morning, she noticed vaginal bleeding.  She is also having pelvic/lower abdominal pain.  It is mostly in the middle, but slightly worse in the right side.  Patient states that she is pregnant, does not know how far along.  Her last period was about 6 weeks ago.  She took a home pregnancy test 2 weeks ago which was positive.  She has not had an OB visit yet, no ultrasound to confirm IUP.  She has had previous pregnancies complicated by hyperemesis gravidarum, however has not had any issues with nausea or vomiting with this pregnancy yet.  She denies fevers, chills, chest pain, shortness of breath, cough, nausea, vomiting, upper abdominal pain, urinary symptoms, vaginal discharge, abnormal bowel movements.  States she takes no medications daily.  She does not know if she has needed RhoGAM in the past.  HPI     Past Medical History:  Diagnosis Date   Anemia    Asthma    Hypokalemia    Kidney stone 2018    Patient Active Problem List   Diagnosis Date Noted   SVD (spontaneous vaginal delivery) 08/08/2017   Post-dates pregnancy 08/06/2017   Supervision of high risk pregnancy, antepartum 06/08/2017   Insufficient prenatal care in third trimester 06/08/2017   Substance abuse affecting pregnancy in third trimester, antepartum 06/08/2017   Obesity in pregnancy with antepartum complication 06/08/2017   Obesity (BMI 30-39.9) 06/08/2017   Nausea and vomiting of pregnancy, antepartum 06/08/2017   Anemia in pregnancy 02/27/2016   Asthma affecting pregnancy, antepartum 02/27/2016    Past Surgical History:  Procedure Laterality Date   NO  PAST SURGERIES     WISDOM TOOTH EXTRACTION       OB History     Gravida  6   Para  3   Term  3   Preterm  0   AB  1   Living  3      SAB  0   IAB  0   Ectopic  0   Multiple  0   Live Births  3        Obstetric Comments  G1: 2014 3430gm SVD G2: 01/2014 3195gm SVD/2nd         Family History  Problem Relation Age of Onset   Cancer Mother        ovarian   Asthma Mother     Social History   Tobacco Use   Smoking status: Former    Pack years: 0.00    Types: Cigarettes   Smokeless tobacco: Never   Tobacco comments:    with +preg test  Vaping Use   Vaping Use: Never used  Substance Use Topics   Alcohol use: No   Drug use: Yes    Types: Marijuana    Comment: last smoked January 2022    Home Medications Prior to Admission medications   Medication Sig Start Date End Date Taking? Authorizing Provider  famotidine (PEPCID) 40 MG tablet Take 1 tablet (40 mg total) by mouth daily. Patient not taking: Reported on 04/11/2021 11/29/20   Hurshel Party, CNM  glycopyrrolate (ROBINUL) 2  MG tablet Take 1 tablet (2 mg total) by mouth 3 (three) times daily as needed. Patient not taking: Reported on 04/11/2021 11/29/20   Hurshel Party, CNM  metoCLOPramide (REGLAN) 10 MG tablet Take 1 tablet (10 mg total) by mouth 3 (three) times daily with meals as needed for nausea. Patient not taking: Reported on 04/11/2021 11/29/20   Hurshel Party, CNM  Prenatal Vit-Fe Fumarate-FA (PRENATAL VITAMIN) 27-0.8 MG TABS Take one tablet daily Patient not taking: Reported on 04/11/2021 11/27/20   Henderly, Britni A, PA-C  promethazine (PHENERGAN) 25 MG tablet Take 0.5-1 tablets (12.5-25 mg total) by mouth at bedtime as needed for nausea or vomiting. Patient not taking: Reported on 04/11/2021 11/29/20   Sharen Counter A, CNM  scopolamine (TRANSDERM-SCOP) 1 MG/3DAYS Place 1 patch (1.5 mg total) onto the skin every 3 (three) days. Patient not taking: Reported on 04/11/2021  12/02/20   Sharen Counter A, CNM    Allergies    Lac bovis and Penicillins  Review of Systems   Review of Systems  Genitourinary:  Positive for pelvic pain and vaginal bleeding.  All other systems reviewed and are negative.  Physical Exam Updated Vital Signs BP 134/82 (BP Location: Left Arm)   Pulse 78   Temp 98 F (36.7 C) (Oral)   Resp 16   LMP 10/06/2020 (Approximate)   SpO2 99%   Physical Exam Vitals and nursing note reviewed. Exam conducted with a chaperone present.  Constitutional:      General: She is not in acute distress.    Appearance: Normal appearance.     Comments: nontoxic  HENT:     Head: Normocephalic and atraumatic.  Eyes:     Conjunctiva/sclera: Conjunctivae normal.     Pupils: Pupils are equal, round, and reactive to light.  Cardiovascular:     Rate and Rhythm: Normal rate and regular rhythm.     Pulses: Normal pulses.  Pulmonary:     Effort: Pulmonary effort is normal. No respiratory distress.     Breath sounds: Normal breath sounds. No wheezing.     Comments: Speaking in full sentences.  Clear lung sounds in all fields. Abdominal:     General: There is no distension.     Palpations: Abdomen is soft. There is no mass.     Tenderness: There is abdominal tenderness. There is no guarding or rebound.     Comments: Suprapubic ttp. No ttp elsewhere  Genitourinary:    Comments: Minimal dark blood noted coming from the cervix.  No clots.  Tenderness palpation of bilateral adnexa.  No discharge. Musculoskeletal:        General: Normal range of motion.     Cervical back: Normal range of motion and neck supple.  Skin:    General: Skin is warm and dry.     Capillary Refill: Capillary refill takes less than 2 seconds.  Neurological:     Mental Status: She is alert and oriented to person, place, and time.  Psychiatric:        Mood and Affect: Mood and affect normal.        Speech: Speech normal.        Behavior: Behavior normal.    ED Results /  Procedures / Treatments   Labs (all labs ordered are listed, but only abnormal results are displayed) Labs Reviewed  WET PREP, GENITAL - Abnormal; Notable for the following components:      Result Value   Clue Cells Wet Prep HPF POC PRESENT (*)  WBC, Wet Prep HPF POC FEW (*)    All other components within normal limits  URINALYSIS, ROUTINE W REFLEX MICROSCOPIC - Abnormal; Notable for the following components:   Hgb urine dipstick LARGE (*)    All other components within normal limits  COMPREHENSIVE METABOLIC PANEL - Abnormal; Notable for the following components:   Sodium 134 (*)    Calcium 8.8 (*)    AST 13 (*)    All other components within normal limits  I-STAT BETA HCG BLOOD, ED ([facility], [facility], [facility] ONLY) - Abnormal; Notable for the following components:   I-stat hCG, quantitative 911.5 (*)    All other components within normal limits  CBC WITH DIFFERENTIAL/PLATELET  RPR  HIV ANTIBODY (ROUTINE TESTING W REFLEX)  ABO/RH  GC/CHLAMYDIA PROBE AMP ([place]) NOT AT [facility]    EKG None  Radiology US OB Comp < 14 Wks  Result Date: 04/11/2021 CLINICAL DATA:  Vaginal bleeding in 1st trimester pregnancy. EXAM: OBSTETRIC <14 WK US AND TRANSVAGINAL OB US TECHNIQUE: Both transabdominal and transvaginal ultrasound examinations were performed for complete evaluation of the gestation as well as the maternal uterus, adnexal regions, and pelvic cul-de-sac. Transvaginal technique was performed to assess early pregnancy. COMPARISON:  None. FINDINGS: Intrauterine gestational sac: None Maternal uterus/adnexae: Endometrial thickness measures 14 mm. No fibroids identified. Normal appearance of the left ovary. Small corpus luteum cyst noted in the right ovary. No adnexal mass identified. Small amount of free fluid noted in the pelvic cul-de-sac. IMPRESSION: Pregnancy of unknown anatomic location (no intrauterine gestational sac or adnexal mass identified). Differential diagnosis includes recent spontaneous  abortion, IUP too early to visualize, and non-visualized ectopic pregnancy. Recommend correlation with serial beta-hCG levels, and follow up US if warranted clinically. Electronically Signed   By: Danae OrleansJohn A Stahl M.D.   On: 04/11/2021 13:19   US OB Transvaginal  Result Date: 04/11/2021 CLINICAL DATA:  Vaginal bleeding in 1st trimester pregnancy. EXAM: OBSTETRIC <14 WK US AND TRANSVAGINAL OB US TECHNIQUE: Both transabdominal and transvaginal ultrasound examinations were performed for complete evaluation of the gestation as well as the maternal uterus, adnexal regions, and pelvic cul-de-sac. Transvaginal technique was performed to assess early pregnancy. COMPARISON:  None. FINDINGS: Intrauterine gestational sac: None Maternal uterus/adnexae: Endometrial thickness measures 14 mm. No fibroids identified. Normal appearance of the left ovary. Small corpus luteum cyst noted in the right ovary. No adnexal mass identified. Small amount of free fluid noted in the pelvic cul-de-sac. IMPRESSION: Pregnancy of unknown anatomic location (no intrauterine gestational sac or adnexal mass identified). Differential diagnosis includes recent spontaneous abortion, IUP too early to visualize, and non-visualized ectopic pregnancy. Recommend correlation with serial beta-hCG levels, and follow up US if warranted clinically. Electronically Signed   By: Danae OrleansJohn A Stahl M.D.   On: 04/11/2021 13:19    Procedures Procedures   Medications Ordered in ED Medications  rho (d) immune globulin (RHIG/RHOPHYLAC) injection 300 mcg (has no administration in time range)    ED Course  I have reviewed the triage vital signs and the nursing notes.  Pertinent labs & imaging results that were available during my care of the patient were reviewed by me and considered in my medical decision making (see chart for details).    MDM Rules/Calculators/A&P                          Patient presented for evaluation of vaginal bleeding in the setting of  pregnancy.  On exam, patient appears nontoxic.  She does have lower abdominal pain.  Consider abortion versus normal bleeding during pregnancy versus ectopic pregnancy.  Pelvic exam shows minimal bleeding, however no hemorrhage.  Labs pending.  Per chart review, patient has needed RhoGAM in the past, will order it.  Labs interpreted by me, overall reassuring.  Hemoglobin stable.  No leukocytosis.  Patient's hCG is 911, will need to be trended.  Pelvic ultrasound does not show pregnancy, which could be early miscarriage, early pregnancy, or an visualized ectopic.  Discussed findings with patient.  Discussed importance of close follow-up with OB/GYN for recheck of hormone level.  Patient left prior to receiving discharge paperwork and prior to receiving RhoGAM shot.   Final Clinical Impression(s) / ED Diagnoses Final diagnoses:  Vaginal bleeding in pregnancy    Rx / DC Orders ED Discharge Orders     None        Alveria Apley, PA-C 04/11/21 1423    Virgina Norfolk, DO 04/11/21 1430

## 2021-04-12 LAB — GC/CHLAMYDIA PROBE AMP (~~LOC~~) NOT AT ARMC
Chlamydia: NEGATIVE
Comment: NEGATIVE
Comment: NORMAL
Neisseria Gonorrhea: NEGATIVE

## 2021-04-12 LAB — RPR: RPR Ser Ql: NONREACTIVE

## 2021-08-05 ENCOUNTER — Other Ambulatory Visit: Payer: Self-pay

## 2021-08-05 ENCOUNTER — Inpatient Hospital Stay (HOSPITAL_COMMUNITY): Payer: Medicaid Other

## 2021-08-05 ENCOUNTER — Inpatient Hospital Stay (HOSPITAL_COMMUNITY)
Admission: AD | Admit: 2021-08-05 | Discharge: 2021-08-05 | Disposition: A | Discharge status: Home / Self Care | Payer: Medicaid Other | Attending: Obstetrics and Gynecology | Admitting: Obstetrics and Gynecology

## 2021-08-05 ENCOUNTER — Encounter (HOSPITAL_COMMUNITY): Payer: Self-pay | Admitting: Obstetrics and Gynecology

## 2021-08-05 DIAGNOSIS — Z3A01 Less than 8 weeks gestation of pregnancy: Secondary | ICD-10-CM | POA: Insufficient documentation

## 2021-08-05 DIAGNOSIS — R109 Unspecified abdominal pain: Secondary | ICD-10-CM

## 2021-08-05 DIAGNOSIS — O2341 Unspecified infection of urinary tract in pregnancy, first trimester: Secondary | ICD-10-CM

## 2021-08-05 DIAGNOSIS — Z880 Allergy status to penicillin: Secondary | ICD-10-CM | POA: Diagnosis not present

## 2021-08-05 DIAGNOSIS — R1030 Lower abdominal pain, unspecified: Secondary | ICD-10-CM | POA: Diagnosis not present

## 2021-08-05 DIAGNOSIS — Z87891 Personal history of nicotine dependence: Secondary | ICD-10-CM | POA: Insufficient documentation

## 2021-08-05 DIAGNOSIS — O3680X0 Pregnancy with inconclusive fetal viability, not applicable or unspecified: Secondary | ICD-10-CM

## 2021-08-05 DIAGNOSIS — O219 Vomiting of pregnancy, unspecified: Secondary | ICD-10-CM

## 2021-08-05 DIAGNOSIS — O210 Mild hyperemesis gravidarum: Secondary | ICD-10-CM | POA: Diagnosis not present

## 2021-08-05 DIAGNOSIS — O26891 Other specified pregnancy related conditions, first trimester: Secondary | ICD-10-CM | POA: Insufficient documentation

## 2021-08-05 LAB — CBC
HCT: 39.1 % (ref 36.0–46.0)
Hemoglobin: 13.3 g/dL (ref 12.0–15.0)
MCH: 29.9 pg (ref 26.0–34.0)
MCHC: 34 g/dL (ref 30.0–36.0)
MCV: 87.9 fL (ref 80.0–100.0)
Platelets: 212 10*3/uL (ref 150–400)
RBC: 4.45 MIL/uL (ref 3.87–5.11)
RDW: 12.6 % (ref 11.5–15.5)
WBC: 10.1 10*3/uL (ref 4.0–10.5)
nRBC: 0 % (ref 0.0–0.2)

## 2021-08-05 LAB — URINALYSIS, ROUTINE W REFLEX MICROSCOPIC
Glucose, UA: NEGATIVE mg/dL
Ketones, ur: >80 mg/dL — AB
Leukocytes,Ua: NEGATIVE
Nitrite: NEGATIVE
Protein, ur: 30 mg/dL — AB
Specific Gravity, Urine: >1.03 — ABNORMAL HIGH (ref 1.005–1.030)
pH: 6 (ref 5.0–8.0)

## 2021-08-05 LAB — URINALYSIS, MICROSCOPIC (REFLEX)

## 2021-08-05 LAB — POCT PREGNANCY, URINE: Preg Test, Ur: POSITIVE — AB

## 2021-08-05 LAB — WET PREP, GENITAL
Sperm: NONE SEEN
Trich, Wet Prep: NONE SEEN
WBC, Wet Prep HPF POC: NONE SEEN
Yeast Wet Prep HPF POC: NONE SEEN

## 2021-08-05 LAB — HCG, QUANTITATIVE, PREGNANCY: hCG, Beta Chain, Quant, S: 49428 m[IU]/mL — ABNORMAL HIGH (ref <5)

## 2021-08-05 MED ORDER — FAMOTIDINE IN NACL 20-0.9 MG/50ML-% IV SOLN
20.0000 mg | Freq: Once | INTRAVENOUS | Status: AC
  Administered 2021-08-05: 20 mg via INTRAVENOUS
  Filled 2021-08-05: qty 50

## 2021-08-05 MED ORDER — LACTATED RINGERS IV BOLUS
1000.0000 mL | Freq: Once | INTRAVENOUS | Status: AC
  Administered 2021-08-05: 1000 mL via INTRAVENOUS

## 2021-08-05 MED ORDER — PROMETHAZINE HCL 25 MG PO TABS: 25.0000 mg | ORAL_TABLET | Freq: Four times a day (QID) | ORAL | 1 refills | Status: DC | PRN

## 2021-08-05 MED ORDER — CEFADROXIL 500 MG PO CAPS: 500.0000 mg | ORAL_CAPSULE | Freq: Two times a day (BID) | ORAL | 0 refills | Status: DC

## 2021-08-05 MED ORDER — SODIUM CHLORIDE 0.9 % IV SOLN
25.0000 mg | Freq: Once | INTRAVENOUS | Status: AC
  Administered 2021-08-05: 25 mg via INTRAVENOUS
  Filled 2021-08-05: qty 25

## 2021-08-05 NOTE — Discharge Instructions (Signed)
Return to care  If you have heavier bleeding that soaks through more than 2 pads per hour for an hour or more If you bleed so much that you feel like you might pass out or you do pass out If you have significant abdominal pain that is not improved with Tylenol   

## 2021-08-05 NOTE — MAU Note (Signed)
Just feels terrible.  Can't keep anything down.  Can't sleep, stomach is killing her.  Doesn't know how far along she is.  Had a miscarriage in June, this is a new preg, never went for follow up.

## 2021-08-05 NOTE — MAU Provider Note (Addendum)
History     CSN: [id number]  Arrival date and time: 08/05/21 1103   Event Date/Time   First Provider Initiated Contact with Patient 08/05/21 1215      Chief Complaint  Patient presents with   Abdominal Pain   Emesis   Possible Pregnancy   Mercedes [name] is a 30yo female [id number] at [redacted]w[redacted]d by LMP presenting today for nausea and vomiting. She states her N/V began Monday evening and she has not been able to keep any food of water down since it began. She has a history of hyperemesis gravidarum, she thinks phenergan has worked best for her in the past. Of note, she began experiencing sharp lower abdominal pain yesterday morning. Denies vaginal bleeding or leaking of fluids. Denies headaches, fevers, cough.   Abdominal Pain Associated symptoms include nausea and vomiting. Pertinent negatives include no constipation, diarrhea, dysuria, fever or headaches.  Emesis  Associated symptoms include abdominal pain. Pertinent negatives include no chest pain, diarrhea, fever or headaches.  Possible Pregnancy Associated symptoms include abdominal pain, nausea and vomiting. Pertinent negatives include no chest pain, congestion, fever or headaches.   OB History     Gravida  6   Para  3   Term  3   Preterm  0   AB  2   Living  3      SAB  2   IAB  0   Ectopic  0   Multiple  0   Live Births  3        Obstetric Comments  G1: 2014 3430gm SVD G2: 01/2014 3195gm SVD/2nd         Past Medical History:  Diagnosis Date   Anemia    Asthma    Hypokalemia    Kidney stone 2018    Past Surgical History:  Procedure Laterality Date   NO PAST SURGERIES     WISDOM TOOTH EXTRACTION      Family History  Problem Relation Age of Onset   Cancer Mother        ovarian   Asthma Mother     Social History   Tobacco Use   Smoking status: Former    Types: Cigarettes    Quit date: 2021    Years since quitting: 1.7   Smokeless tobacco: Never   Tobacco comments:    with +preg  test  Vaping Use   Vaping Use: Never used  Substance Use Topics   Alcohol use: No   Drug use: Not Currently    Types: Marijuana    Comment: last used marijuana one week ago as of 08/05/2021    Allergies:  Allergies  Allergen Reactions   Lac Bovis Hives   Penicillins Other (See Comments)    Reaction:  Unknown  Has patient had a PCN reaction causing immediate rash, facial/tongue/throat swelling, SOB or lightheadedness with hypotension: Unknown Has patient had a PCN reaction causing severe rash involving mucus membranes or skin necrosis: Unknown Has patient had a PCN reaction that required hospitalization: Unknown Has patient had a PCN reaction occurring within the last 10 years: No If all of the above answers are "NO", then may proceed with Cephalosporin use. Unknown reaction Childhood allergy    Medications Prior to Admission  Medication Sig Dispense Refill Last Dose   famotidine (PEPCID) 40 MG tablet Take 1 tablet (40 mg total) by mouth daily. (Patient not taking: Reported on 04/11/2021) 30 tablet 2    glycopyrrolate (ROBINUL) 2 MG tablet Take 1 tablet (2  mg total) by mouth 3 (three) times daily as needed. (Patient not taking: Reported on 04/11/2021) 30 tablet 3    metoCLOPramide (REGLAN) 10 MG tablet Take 1 tablet (10 mg total) by mouth 3 (three) times daily with meals as needed for nausea. (Patient not taking: Reported on 04/11/2021) 90 tablet 2    Prenatal Vit-Fe Fumarate-FA (PRENATAL VITAMIN) 27-0.8 MG TABS Take one tablet daily (Patient not taking: Reported on 04/11/2021) 30 tablet 0    promethazine (PHENERGAN) 25 MG tablet Take 0.5-1 tablets (12.5-25 mg total) by mouth at bedtime as needed for nausea or vomiting. (Patient not taking: Reported on 04/11/2021) 30 tablet 2    scopolamine (TRANSDERM-SCOP) 1 MG/3DAYS Place 1 patch (1.5 mg total) onto the skin every 3 (three) days. (Patient not taking: Reported on 04/11/2021) 10 patch 12     Review of Systems  Constitutional:  Negative  for fever.  HENT:  Negative for congestion.   Eyes:  Negative for visual disturbance.  Respiratory:  Negative for shortness of breath.   Cardiovascular:  Negative for chest pain.  Gastrointestinal:  Positive for abdominal pain, nausea and vomiting. Negative for constipation and diarrhea.  Genitourinary:  Negative for dysuria, vaginal bleeding and vaginal discharge.  Musculoskeletal:  Positive for back pain.  Neurological:  Positive for light-headedness. Negative for syncope and headaches.  Physical Exam   Blood pressure (!) 99/54, pulse 79, temperature 98.3 F (36.8 C), temperature source Oral, resp. rate 20, height 5\' 4"  (1.626 m), weight 103.2 kg, last menstrual period 06/28/2021, SpO2 98 %, unknown if currently breastfeeding.  Physical Exam Vitals reviewed.  HENT:     Head: Normocephalic.  Eyes:     Extraocular Movements: Extraocular movements intact.  Cardiovascular:     Rate and Rhythm: Normal rate.  Pulmonary:     Effort: Pulmonary effort is normal.  Abdominal:     General: Abdomen is flat.     Palpations: Abdomen is soft.  Skin:    General: Skin is warm.  Neurological:     General: No focal deficit present.     Mental Status: She is alert and oriented to person, place, and time.  Psychiatric:        Mood and Affect: Mood normal.    MAU Course  Procedures  MDM Moderate U/S less than 14 weeks with transvaginal  CBC, hCG Wet prep, GC/chlamydia  1L bolus LR, IV phenergan 25mg , IV Pepcid 20mg   Assessment and Plan  1. Abdominal Pain  - Ectopic workup initiated  - CBC unremarkable. Beta hCG 08/28/2021. U/S shows single gestational sac without yolk sac, embryo, or cardiac activity. Follow up in 14 days for outpatient U/S for definitive diagnosis. Return precautions given and pt voiced understanding.  - Wet prep shows clue cells. Pt denies vaginal discharge or odor. Does not meet Amsel criteria for BV diagnosis. Pt advised of findings and elected to start antibiotic  treatment with the hope of improving her abdominal pain.   2. Nausea/Vomiting  - Urinalysis shows ketones, protein with SG of over 1.030. LR 1L bolus given. IV Phenergan improved nausea to the point of almost being resolved. Pt given PO Phenergan prescription with discharge. Advised to return with any worsening of or new symptoms.  08/05/2021, 3:08 PM     Attestation of Supervision of Student:  I confirm that I have verified the information documented in the physician assistant student's note and that I have also personally performed the history, physical exam and all medical decision making  activities.  I have verified that all services and findings are accurately documented in this student's note; and I agree with management and plan as outlined in the documentation. I have also made any necessary editorial changes.  History Mercedes [name] is a 30 y.o. [phone] at 104w3d who presents with n/v, abdominal cramping, and back pain. Nausea & vomiting started on Monday. States she hasn't been able to keep anything down since then. Phenergan has worked best for her in previous pregnancies.  Also reports abdominal cramping & low back pain that started yesterday. Denies vaginal bleeding, vaginal discharge, dysuria, or fever.   Physical exam BP (!) 110/50 (BP Location: Right Arm)   Pulse 89   Temp 98.3 F (36.8 C) (Oral)   Resp 15   Ht 5\' 4"  (1.626 m)   Wt 103.2 kg   LMP 06/28/2021   SpO2 97%   Breastfeeding Unknown   BMI 39.05 kg/m   Physical Examination: General appearance - alert, well appearing, and in no distress Mental status - alert, oriented to person, place, and time Eyes - sclera anicteric Chest - normal respiratory effort Neurological - motor and sensory grossly normal bilaterally   Assessment/Plan 1. Nausea and vomiting during pregnancy prior to [redacted] weeks gestation  -rx phenergan  2. Abdominal pain during pregnancy in first trimester  -ultrasound shows  empty IUGS measuring 16.5 mm with HCG >40k. Reviewed with Dr. 08/28/2021 who recommends f/u ultrasound in 2 weeks.  -reviewed SAB precautions with patient  3. Pregnancy with uncertain fetal viability, single or unspecified fetus   4. Urinary tract infection in mother during first trimester of pregnancy  -urine culture pending -Rx duricef (no issues taking cephalosporins in the past)        Crissie Reese, NP [facility], [facility] 08/05/2021 5:28 PM

## 2021-08-06 LAB — GC/CHLAMYDIA PROBE AMP (~~LOC~~) NOT AT ARMC
Chlamydia: NEGATIVE
Comment: NEGATIVE
Comment: NORMAL
Neisseria Gonorrhea: NEGATIVE

## 2021-08-07 ENCOUNTER — Encounter (HOSPITAL_COMMUNITY): Payer: Self-pay | Admitting: Emergency Medicine

## 2021-08-07 ENCOUNTER — Emergency Department (HOSPITAL_COMMUNITY)
Admission: EM | Admit: 2021-08-07 | Discharge: 2021-08-07 | Disposition: A | Discharge status: Home / Self Care | Payer: Medicaid Other | Attending: Emergency Medicine | Admitting: Emergency Medicine

## 2021-08-07 ENCOUNTER — Other Ambulatory Visit: Payer: Self-pay

## 2021-08-07 DIAGNOSIS — R112 Nausea with vomiting, unspecified: Secondary | ICD-10-CM | POA: Diagnosis not present

## 2021-08-07 DIAGNOSIS — M79604 Pain in right leg: Secondary | ICD-10-CM | POA: Diagnosis not present

## 2021-08-07 DIAGNOSIS — M79605 Pain in left leg: Secondary | ICD-10-CM | POA: Insufficient documentation

## 2021-08-07 DIAGNOSIS — R109 Unspecified abdominal pain: Secondary | ICD-10-CM | POA: Insufficient documentation

## 2021-08-07 DIAGNOSIS — Z5321 Procedure and treatment not carried out due to patient leaving prior to being seen by health care provider: Secondary | ICD-10-CM | POA: Diagnosis not present

## 2021-08-07 NOTE — ED Triage Notes (Signed)
PT c/o abdominal pain with N/V and cramping to bilateral legs x5 days. Denies diarrhea.

## 2021-08-07 NOTE — ED Provider Notes (Signed)
Emergency Medicine Provider Triage Evaluation Note  Mercedes [name] , a 30 y.o. female  was evaluated in triage.  Pt complains of vomiting, nausea, and lower abdominal pain for the past 6 days. The patient reports she had a positive pregnancy test back in June, and was seen at [facility] twice for vaginal bleeding. She had one day of bleeding in August and does not know if she is still pregnant. Denies any dysuria or hematuria. Patient reports she is unable to keep down any foods or fluids and is only urinating once daily.   Review of Systems  Positive: Abdominal pain, nausea, vomiting,  Negative: Vaginal bleeding, vaginal discharge, dysuria, hematuria, fever,   Physical Exam  BP (!) 104/55 (BP Location: Left Arm)   Pulse 86   Temp 98.4 F (36.9 C) (Oral)   Resp 18   LMP 05/30/2021 (Approximate)   SpO2 99%   Breastfeeding Unknown  Gen:   Awake, no distress , appears uncomfrotable Resp:  Normal effort  MSK:   Moves extremities without difficulty  Other:  Abdomen soft. Generalized tenderness. No guarding or rebound.   Medical Decision Making  Medically screening exam initiated at 10:19 AM.  Appropriate orders placed.  Mercedes [name] was informed that the remainder of the evaluation will be completed by another provider, this initial triage assessment does not replace that evaluation, and the importance of remaining in the ED until their evaluation is complete.  Labs ordered   Achille Rich, PA-C 08/07/21 1024    Terald Sleeper, MD 08/07/21 [phone]

## 2021-08-07 NOTE — ED Notes (Signed)
Pt called 3 times with no response.  

## 2021-08-08 ENCOUNTER — Encounter (HOSPITAL_COMMUNITY): Payer: Self-pay | Admitting: Obstetrics & Gynecology

## 2021-08-08 ENCOUNTER — Other Ambulatory Visit: Payer: Self-pay

## 2021-08-08 ENCOUNTER — Inpatient Hospital Stay (HOSPITAL_COMMUNITY)
Admission: AD | Admit: 2021-08-08 | Discharge: 2021-08-08 | Disposition: A | Discharge status: Home / Self Care | Payer: Medicaid Other | Attending: Obstetrics & Gynecology | Admitting: Obstetrics & Gynecology

## 2021-08-08 DIAGNOSIS — O210 Mild hyperemesis gravidarum: Secondary | ICD-10-CM | POA: Diagnosis not present

## 2021-08-08 DIAGNOSIS — Z87891 Personal history of nicotine dependence: Secondary | ICD-10-CM | POA: Diagnosis not present

## 2021-08-08 DIAGNOSIS — O219 Vomiting of pregnancy, unspecified: Secondary | ICD-10-CM | POA: Insufficient documentation

## 2021-08-08 DIAGNOSIS — Z3A01 Less than 8 weeks gestation of pregnancy: Secondary | ICD-10-CM | POA: Diagnosis not present

## 2021-08-08 LAB — URINALYSIS, ROUTINE W REFLEX MICROSCOPIC
Bilirubin Urine: NEGATIVE
Glucose, UA: NEGATIVE mg/dL
Hgb urine dipstick: NEGATIVE
Ketones, ur: 20 mg/dL — AB
Nitrite: NEGATIVE
Protein, ur: 30 mg/dL — AB
Specific Gravity, Urine: 1.025 (ref 1.005–1.030)
pH: 5 (ref 5.0–8.0)

## 2021-08-08 MED ORDER — ONDANSETRON HCL 4 MG/2ML IJ SOLN
4.0000 mg | Freq: Once | INTRAMUSCULAR | Status: AC
  Administered 2021-08-08: 4 mg via INTRAVENOUS
  Filled 2021-08-08: qty 2

## 2021-08-08 MED ORDER — METOCLOPRAMIDE HCL 10 MG PO TABS: 10.0000 mg | ORAL_TABLET | Freq: Four times a day (QID) | ORAL | 1 refills | Status: DC

## 2021-08-08 MED ORDER — FAMOTIDINE 20 MG PO TABS: 20.0000 mg | ORAL_TABLET | Freq: Two times a day (BID) | ORAL | 1 refills | Status: DC

## 2021-08-08 MED ORDER — LACTATED RINGERS IV BOLUS
1000.0000 mL | Freq: Once | INTRAVENOUS | Status: AC
  Administered 2021-08-08: 1000 mL via INTRAVENOUS

## 2021-08-08 MED ORDER — SODIUM CHLORIDE 0.9 % IV SOLN
25.0000 mg | Freq: Once | INTRAVENOUS | Status: AC
  Administered 2021-08-08: 25 mg via INTRAVENOUS
  Filled 2021-08-08: qty 1

## 2021-08-08 MED ORDER — FAMOTIDINE IN NACL 20-0.9 MG/50ML-% IV SOLN
20.0000 mg | Freq: Once | INTRAVENOUS | Status: AC
  Administered 2021-08-08: 20 mg via INTRAVENOUS
  Filled 2021-08-08: qty 50

## 2021-08-08 MED ORDER — ONDANSETRON 8 MG PO TBDP: 8.0000 mg | ORAL_TABLET | Freq: Three times a day (TID) | ORAL | 1 refills | Status: DC | PRN

## 2021-08-08 MED ORDER — GLYCOPYRROLATE 0.2 MG/ML IJ SOLN: 0.3000 mg | Freq: Once | INTRAMUSCULAR | Status: DC

## 2021-08-08 MED ORDER — SCOPOLAMINE 1 MG/3DAYS TD PT72: 1.0000 | MEDICATED_PATCH | TRANSDERMAL | 12 refills | Status: DC

## 2021-08-08 MED ORDER — PROMETHAZINE HCL 25 MG RE SUPP: 25.0000 mg | Freq: Four times a day (QID) | RECTAL | 0 refills | Status: DC | PRN

## 2021-08-08 MED ORDER — GLYCOPYRROLATE 0.2 MG/ML IJ SOLN
0.2000 mg | Freq: Once | INTRAMUSCULAR | Status: AC
  Administered 2021-08-08: 0.2 mg via INTRAVENOUS
  Filled 2021-08-08: qty 1

## 2021-08-08 NOTE — Discharge Instructions (Signed)
Safe Medications in Pregnancy    Acne: Benzoyl Peroxide Salicylic Acid  Backache/Headache: Tylenol: 2 regular strength every 4 hours OR              2 Extra strength every 6 hours  Colds/Coughs/Allergies: Benadryl (alcohol free) 25 mg every 6 hours as needed Breath right strips Claritin Cepacol throat lozenges Chloraseptic throat spray Cold-Eeze- up to three times per day Cough drops, alcohol free Flonase (by prescription only) Guaifenesin Mucinex Robitussin DM (plain only, alcohol free) Saline nasal spray/drops Sudafed (pseudoephedrine) & Actifed ** use only after [redacted] weeks gestation and if you do not have high blood pressure Tylenol Vicks Vaporub Zinc lozenges Zyrtec   Constipation: Colace Ducolax suppositories Fleet enema Glycerin suppositories Metamucil Milk of magnesia Miralax Senokot Smooth move tea  Diarrhea: Kaopectate Imodium A-D  *NO pepto Bismol  Hemorrhoids: Anusol Anusol HC Preparation H Tucks  Indigestion: Tums Maalox Mylanta Zantac  Pepcid  Insomnia: Benadryl (alcohol free) 25mg every 6 hours as needed Tylenol PM Unisom, no Gelcaps  Leg Cramps: Tums MagGel  Nausea/Vomiting:  Bonine Dramamine Emetrol Ginger extract Sea bands Meclizine  Nausea medication to take during pregnancy:  Unisom (doxylamine succinate 25 mg tablets) Take one tablet daily at bedtime. If symptoms are not adequately controlled, the dose can be increased to a maximum recommended dose of two tablets daily (1/2 tablet in the morning, 1/2 tablet mid-afternoon and one at bedtime). Vitamin B6 100mg tablets. Take one tablet twice a day (up to 200 mg per day).  Skin Rashes: Aveeno products Benadryl cream or 25mg every 6 hours as needed Calamine Lotion 1% cortisone cream  Yeast infection: Gyne-lotrimin 7 Monistat 7   **If taking multiple medications, please check labels to avoid duplicating the same active ingredients **take  medication as directed on the label ** Do not exceed 4000 mg of tylenol in 24 hours **Do not take medications that contain aspirin or ibuprofen   Prenatal Care Providers           [facility]  [address] [phone]  [facility]   [address]  [phone]  [facility] [place]       [address] [phone]            [facility] @ [place]     [address] [phone]          [facility] @ [place]   [address] [phone]  [facility]  [address] [phone]     [facility] @ [facility])  [address]   [phone]     [facility]  Phone: [phone]  [facility]  Phone: [phone]  [facility] Phone: [phone]  [facility] Phone: [phone]  [facility] Phone: [phone]  [facility] Phone: [phone]  [facility]  Phone: [phone]  

## 2021-08-08 NOTE — MAU Provider Note (Signed)
History     CSN: [id number]  Arrival date and time: 08/08/21 0707   Event Date/Time   First Provider Initiated Contact with Patient 08/08/21 [phone]      Chief Complaint  Patient presents with   Nausea   Abdominal Pain   Emesis   HPI Mercedes [name] is a 30 y.o. [id number] at [redacted]w[redacted]d who presents with nausea and vomiting. She was seen recently in the MAU with the same complaint and prescribed phenergan. She reports it was helping until last night. She has thrown up more times than she can count over night. She also reports spitting. She denies any pain or bleeding. She has not established care anywhere yet.   OB History     Gravida  6   Para  3   Term  3   Preterm  0   AB  2   Living  3      SAB  2   IAB  0   Ectopic  0   Multiple  1   Live Births  3        Obstetric Comments  G1: 2014 3430gm SVD G2: 01/2014 3195gm SVD/2nd        OB History     Gravida  6   Para  3   Term  3   Preterm  0   AB  2   Living  3      SAB  2   IAB  0   Ectopic  0   Multiple  1   Live Births  3        Obstetric Comments  G1: 2014 3430gm SVD G2: 01/2014 3195gm SVD/2nd        Past Medical History:  Diagnosis Date   Anemia    Asthma    Hypokalemia    Kidney stone 2018    Past Surgical History:  Procedure Laterality Date   NO PAST SURGERIES     WISDOM TOOTH EXTRACTION      Family History  Problem Relation Age of Onset   Cancer Mother        ovarian   Asthma Mother     Social History   Tobacco Use   Smoking status: Former    Types: Cigarettes    Quit date: 2021    Years since quitting: 1.7   Smokeless tobacco: Never   Tobacco comments:    with +preg test  Vaping Use   Vaping Use: Never used  Substance Use Topics   Alcohol use: No   Drug use: Not Currently    Types: Marijuana    Comment: last used marijuana one week ago as of 08/05/2021    Allergies:  Allergies  Allergen Reactions   Lac Bovis Hives   Penicillins Other  (See Comments)    Reaction:  Unknown  Has patient had a PCN reaction causing immediate rash, facial/tongue/throat swelling, SOB or lightheadedness with hypotension: Unknown Has patient had a PCN reaction causing severe rash involving mucus membranes or skin necrosis: Unknown Has patient had a PCN reaction that required hospitalization: Unknown Has patient had a PCN reaction occurring within the last 10 years: No If all of the above answers are "NO", then may proceed with Cephalosporin use. Unknown reaction Childhood allergy    Medications Prior to Admission  Medication Sig Dispense Refill Last Dose   cefadroxil (DURICEF) 500 MG capsule Take 1 capsule (500 mg total) by mouth 2 (two) times daily for 7 days. 14  capsule 0    promethazine (PHENERGAN) 25 MG tablet Take 1 tablet (25 mg total) by mouth every 6 (six) hours as needed for nausea or vomiting. 30 tablet 1     Review of Systems  Constitutional: Negative.  Negative for fatigue and fever.  HENT: Negative.    Respiratory: Negative.  Negative for shortness of breath.   Cardiovascular: Negative.  Negative for chest pain.  Gastrointestinal:  Positive for nausea and vomiting. Negative for abdominal pain, constipation and diarrhea.  Genitourinary: Negative.  Negative for dysuria and vaginal discharge.  Neurological: Negative.  Negative for dizziness and headaches.  Physical Exam   Blood pressure (!) 147/74, pulse 76, temperature 98 F (36.7 C), temperature source Oral, resp. rate 15, last menstrual period 06/28/2021, SpO2 99 %, unknown if currently breastfeeding.  Physical Exam Vitals and nursing note reviewed.  Constitutional:      General: She is not in acute distress.    Appearance: She is well-developed.  HENT:     Head: Normocephalic.  Eyes:     Pupils: Pupils are equal, round, and reactive to light.  Cardiovascular:     Rate and Rhythm: Normal rate and regular rhythm.     Heart sounds: Normal heart sounds.  Pulmonary:      Effort: Pulmonary effort is normal. No respiratory distress.     Breath sounds: Normal breath sounds.  Abdominal:     General: Bowel sounds are normal. There is no distension.     Palpations: Abdomen is soft.     Tenderness: There is no abdominal tenderness.  Skin:    General: Skin is warm and dry.  Neurological:     Mental Status: She is alert and oriented to person, place, and time.  Psychiatric:        Mood and Affect: Mood normal.        Behavior: Behavior normal.        Thought Content: Thought content normal.        Judgment: Judgment normal.    MAU Course  Procedures Results for orders placed or performed during the hospital encounter of 08/08/21 (from the past 24 hour(s))  Urinalysis, Routine w reflex microscopic Urine, Clean Catch     Status: Abnormal   Collection Time: 08/08/21  7:26 AM  Result Value Ref Range   Color, Urine AMBER (A) YELLOW   APPearance CLOUDY (A) CLEAR   Specific Gravity, Urine 1.025 1.005 - 1.030   pH 5.0 5.0 - 8.0   Glucose, UA NEGATIVE NEGATIVE mg/dL   Hgb urine dipstick NEGATIVE NEGATIVE   Bilirubin Urine NEGATIVE NEGATIVE   Ketones, ur 20 (A) NEGATIVE mg/dL   Protein, ur 30 (A) NEGATIVE mg/dL   Nitrite NEGATIVE NEGATIVE   Leukocytes,Ua MODERATE (A) NEGATIVE   RBC / HPF 6-10 0 - 5 RBC/hpf   WBC, UA 11-20 0 - 5 WBC/hpf   Bacteria, UA RARE (A) NONE SEEN   Squamous Epithelial / LPF 21-50 0 - 5   Mucus PRESENT     MDM UA, UC LR bolus Zofran IV Pepcid IV Phenergan in LR bolus  Assessment and Plan   1. Nausea and vomiting during pregnancy   2. [redacted] weeks gestation of pregnancy    -Discharge home in stable condition -Rx for zofran, reglan, phenergan suppositories, pepcid and scop patches sent to patient's pharmacy -First trimester precautions discussed -Patient advised to follow-up with OB as scheduled for prenatal care -Patient may return to MAU as needed or if her condition were to  change or worsen   Rolm Bookbinder CNM 08/08/2021,  7:58 AM

## 2021-08-08 NOTE — MAU Note (Signed)
Pt. Reports to mau with c/o nausea and vomiting for the past few days. Reports 5 episodes of emesis today.  Pt states she last took her meds last night with dinner.  Denies vag bleeding but endorses lower abd pain.

## 2021-08-09 LAB — CULTURE, OB URINE

## 2021-08-11 ENCOUNTER — Inpatient Hospital Stay (HOSPITAL_COMMUNITY): Payer: Medicaid Other

## 2021-08-11 ENCOUNTER — Other Ambulatory Visit: Payer: Self-pay

## 2021-08-11 ENCOUNTER — Encounter (HOSPITAL_COMMUNITY): Payer: Self-pay | Admitting: Obstetrics & Gynecology

## 2021-08-11 ENCOUNTER — Inpatient Hospital Stay (HOSPITAL_COMMUNITY)
Admission: AD | Admit: 2021-08-11 | Discharge: 2021-08-12 | Disposition: A | Discharge status: Home / Self Care | Payer: Medicaid Other | Attending: Obstetrics & Gynecology | Admitting: Obstetrics & Gynecology

## 2021-08-11 DIAGNOSIS — O99281 Endocrine, nutritional and metabolic diseases complicating pregnancy, first trimester: Secondary | ICD-10-CM

## 2021-08-11 DIAGNOSIS — O210 Mild hyperemesis gravidarum: Secondary | ICD-10-CM | POA: Diagnosis present

## 2021-08-11 DIAGNOSIS — Z880 Allergy status to penicillin: Secondary | ICD-10-CM | POA: Diagnosis not present

## 2021-08-11 DIAGNOSIS — R1110 Vomiting, unspecified: Secondary | ICD-10-CM

## 2021-08-11 DIAGNOSIS — Z87891 Personal history of nicotine dependence: Secondary | ICD-10-CM | POA: Insufficient documentation

## 2021-08-11 DIAGNOSIS — E876 Hypokalemia: Secondary | ICD-10-CM

## 2021-08-11 DIAGNOSIS — E860 Dehydration: Secondary | ICD-10-CM | POA: Diagnosis not present

## 2021-08-11 DIAGNOSIS — Z3A01 Less than 8 weeks gestation of pregnancy: Secondary | ICD-10-CM | POA: Insufficient documentation

## 2021-08-11 DIAGNOSIS — O211 Hyperemesis gravidarum with metabolic disturbance: Secondary | ICD-10-CM | POA: Insufficient documentation

## 2021-08-11 DIAGNOSIS — O3680X0 Pregnancy with inconclusive fetal viability, not applicable or unspecified: Secondary | ICD-10-CM

## 2021-08-11 DIAGNOSIS — K117 Disturbances of salivary secretion: Secondary | ICD-10-CM

## 2021-08-11 LAB — URINALYSIS, ROUTINE W REFLEX MICROSCOPIC
Bilirubin Urine: NEGATIVE
Glucose, UA: NEGATIVE mg/dL
Ketones, ur: 80 mg/dL — AB
Nitrite: NEGATIVE
Protein, ur: 100 mg/dL — AB
Specific Gravity, Urine: 1.032 — ABNORMAL HIGH (ref 1.005–1.030)
WBC, UA: >50 WBC/hpf — ABNORMAL HIGH (ref 0–5)
pH: 5 (ref 5.0–8.0)

## 2021-08-11 LAB — BASIC METABOLIC PANEL
Anion gap: 14 (ref 5–15)
BUN: 12 mg/dL (ref 6–20)
CO2: 29 mmol/L (ref 22–32)
Calcium: 9.4 mg/dL (ref 8.9–10.3)
Chloride: 92 mmol/L — ABNORMAL LOW (ref 98–111)
Creatinine, Ser: 0.75 mg/dL (ref 0.44–1.00)
GFR, Estimated: >60 mL/min (ref >60)
Glucose, Bld: 104 mg/dL — ABNORMAL HIGH (ref 70–99)
Potassium: 2.7 mmol/L — CL (ref 3.5–5.1)
Sodium: 135 mmol/L (ref 135–145)

## 2021-08-11 LAB — HCG, QUANTITATIVE, PREGNANCY: hCG, Beta Chain, Quant, S: 95501 m[IU]/mL — ABNORMAL HIGH (ref <5)

## 2021-08-11 MED ORDER — FAMOTIDINE IN NACL 20-0.9 MG/50ML-% IV SOLN
20.0000 mg | Freq: Once | INTRAVENOUS | Status: AC
  Administered 2021-08-11: 20 mg via INTRAVENOUS
  Filled 2021-08-11: qty 50

## 2021-08-11 MED ORDER — POTASSIUM CHLORIDE 10 MEQ/100ML IV SOLN
10.0000 meq | INTRAVENOUS | Status: AC
  Administered 2021-08-11 – 2021-08-12 (×4): 10 meq via INTRAVENOUS
  Filled 2021-08-11 (×4): qty 100

## 2021-08-11 MED ORDER — SODIUM CHLORIDE 0.9 % IV SOLN: Freq: Once | INTRAVENOUS | Status: AC

## 2021-08-11 MED ORDER — GLYCOPYRROLATE 1 MG PO TABS
1.0000 mg | ORAL_TABLET | Freq: Once | ORAL | Status: AC
  Administered 2021-08-12: 1 mg via ORAL
  Filled 2021-08-11: qty 1

## 2021-08-11 MED ORDER — SODIUM CHLORIDE 0.9 % IV SOLN
12.5000 mg | Freq: Once | INTRAVENOUS | Status: AC
  Administered 2021-08-12: 12.5 mg via INTRAVENOUS
  Filled 2021-08-11: qty 12.5

## 2021-08-11 MED ORDER — LACTATED RINGERS IV SOLN: Freq: Once | INTRAVENOUS | Status: DC

## 2021-08-11 MED ORDER — ONDANSETRON HCL 4 MG/2ML IJ SOLN
4.0000 mg | Freq: Once | INTRAMUSCULAR | Status: AC
  Administered 2021-08-11: 4 mg via INTRAVENOUS
  Filled 2021-08-11: qty 2

## 2021-08-11 MED ORDER — LACTATED RINGERS IV BOLUS
1000.0000 mL | Freq: Once | INTRAVENOUS | Status: AC
  Administered 2021-08-11: 1000 mL via INTRAVENOUS

## 2021-08-11 NOTE — MAU Note (Signed)
Unable to keep down anything for a wk. Phenergan is not helping. Some red spotting earlier today. Some pain in left lower abdomen.

## 2021-08-11 NOTE — MAU Provider Note (Signed)
Chief Complaint: Emesis During Pregnancy   Event Date/Time   First Provider Initiated Contact with Patient 08/11/21 2108        SUBJECTIVE HPI: Mercedes [name] is a 30 y.o. [id number] at [redacted]w[redacted]d by LMP who presents to maternity admissions reporting persistent nausea, vomiting and spitting.  Last took Zofran yesterday. Has not tried any meds since then "because I felt so bad".  Did not try eating or drinking.  Has multiple meds prescribed previously. . She denies h/a, dizziness, or fever/chills.   Was seen for bleeding and pregnancy of unknown location but only had one [place] and one HCG level (49k).  Not scheduled for repeat US until 10/27.  [place] reading was unclear in that it said there was no cardiac activity but no fetal pole and positive HR.  It appears there was only an empty gestational sac. Therefore, ectopic has not been ruled out.   Emesis  This is a recurrent problem. The problem has been unchanged. There has been no fever. Pertinent negatives include no abdominal pain, chest pain, chills, diarrhea or fever. She has tried nothing for the symptoms.   RN Note: Unable to keep down anything for a wk. Phenergan is not helping. Some red spotting earlier today. Some pain in left lower abdomen  Past Medical History:  Diagnosis Date   Anemia    Asthma    Hypokalemia    Kidney stone 2018   Past Surgical History:  Procedure Laterality Date   NO PAST SURGERIES     WISDOM TOOTH EXTRACTION     Social History   Socioeconomic History   Marital status: Single    Spouse name: Not on file   Number of children: Not on file   Years of education: Not on file   Highest education level: Not on file  Occupational History   Not on file  Tobacco Use   Smoking status: Former    Types: Cigarettes    Quit date: 2021    Years since quitting: 1.7   Smokeless tobacco: Never   Tobacco comments:    with +preg test  Vaping Use   Vaping Use: Never used  Substance and Sexual Activity   Alcohol use: No    Drug use: Not Currently    Types: Marijuana    Comment: last used marijuana one week ago as of 08/05/2021   Sexual activity: Yes    Birth control/protection: None    Comment: Patient wants tubes tied  Other Topics Concern   Not on file  Social History Narrative   Not on file   Social Determinants of Health   Financial Resource Strain: Not on file  Food Insecurity: Not on file  Transportation Needs: Not on file  Physical Activity: Not on file  Stress: Not on file  Social Connections: Not on file  Intimate Partner Violence: Not on file   No current facility-administered medications on file prior to encounter.   Current Outpatient Medications on File Prior to Encounter  Medication Sig Dispense Refill   famotidine (PEPCID) 20 MG tablet Take 1 tablet (20 mg total) by mouth 2 (two) times daily. 30 tablet 1   metoCLOPramide (REGLAN) 10 MG tablet Take 1 tablet (10 mg total) by mouth every 6 (six) hours. 30 tablet 1   ondansetron (ZOFRAN ODT) 8 MG disintegrating tablet Take 1 tablet (8 mg total) by mouth every 8 (eight) hours as needed for nausea or vomiting. 30 tablet 1   promethazine (PHENERGAN) 25 MG suppository Place  1 suppository (25 mg total) rectally every 6 (six) hours as needed for nausea or vomiting. 12 each 0   promethazine (PHENERGAN) 25 MG tablet Take 1 tablet (25 mg total) by mouth every 6 (six) hours as needed for nausea or vomiting. 30 tablet 1   scopolamine (TRANSDERM-SCOP, 1.5 MG,) 1 MG/3DAYS Place 1 patch (1.5 mg total) onto the skin every 3 (three) days. 10 patch 12   Allergies  Allergen Reactions   Lac Bovis Hives   Penicillins Other (See Comments)    Reaction:  Unknown  Has patient had a PCN reaction causing immediate rash, facial/tongue/throat swelling, SOB or lightheadedness with hypotension: Unknown Has patient had a PCN reaction causing severe rash involving mucus membranes or skin necrosis: Unknown Has patient had a PCN reaction that required  hospitalization: Unknown Has patient had a PCN reaction occurring within the last 10 years: No If all of the above answers are "NO", then may proceed with Cephalosporin use. Unknown reaction Childhood allergy    I have reviewed patient's Past Medical Hx, Surgical Hx, Family Hx, Social Hx, medications and allergies.   ROS:  Review of Systems  Constitutional:  Negative for chills and fever.  Cardiovascular:  Negative for chest pain.  Gastrointestinal:  Positive for vomiting. Negative for abdominal pain and diarrhea.  Review of Systems  Other systems negative   Physical Exam  Physical Exam Patient Vitals for the past 24 hrs:  BP Temp Temp src Pulse Resp SpO2 Height Weight  08/11/21 2052 122/63 98.1 F (36.7 C) Oral 75 20 -- -- --  08/11/21 2038 127/70 -- -- 78 -- 100 % -- --  08/11/21 2036 -- 98.6 F (37 C) -- -- 17 -- 5\' 4"  (1.626 m) 98.9 kg   Constitutional: Well-developed, well-nourished female in no acute distress.  Cardiovascular: normal rate Respiratory: normal effort GI: Abd soft, non-tender. MS: Extremities nontender, no edema, normal ROM Neurologic: Alert and oriented x 4.  GU: Neg CVAT.  PELVIC EXAM: reports spotting, not reexamined  LAB RESULTS Results for orders placed or performed during the hospital encounter of 08/11/21 (from the past 24 hour(s))  Urinalysis, Routine w reflex microscopic Urine, Clean Catch     Status: Abnormal   Collection Time: 08/11/21  8:42 PM  Result Value Ref Range   Color, Urine AMBER (A) YELLOW   APPearance CLOUDY (A) CLEAR   Specific Gravity, Urine 1.032 (H) 1.005 - 1.030   pH 5.0 5.0 - 8.0   Glucose, UA NEGATIVE NEGATIVE mg/dL   Hgb urine dipstick MODERATE (A) NEGATIVE   Bilirubin Urine NEGATIVE NEGATIVE   Ketones, ur 80 (A) NEGATIVE mg/dL   Protein, ur 08/13/21 (A) NEGATIVE mg/dL   Nitrite NEGATIVE NEGATIVE   Leukocytes,Ua MODERATE (A) NEGATIVE   RBC / HPF 21-50 0 - 5 RBC/hpf   WBC, UA >50 (H) 0 - 5 WBC/hpf   Bacteria, UA FEW  (A) NONE SEEN   Squamous Epithelial / LPF 21-50 0 - 5   WBC Clumps PRESENT    Mucus PRESENT   Basic metabolic panel     Status: Abnormal   Collection Time: 08/11/21  9:33 PM  Result Value Ref Range   Sodium 135 135 - 145 mmol/L   Potassium 2.7 (LL) 3.5 - 5.1 mmol/L   Chloride 92 (L) 98 - 111 mmol/L   CO2 29 22 - 32 mmol/L   Glucose, Bld 104 (H) 70 - 99 mg/dL   BUN 12 6 - 20 mg/dL   Creatinine, Ser 08/13/21  0.44 - 1.00 mg/dL   Calcium 9.4 8.9 - 74.1 mg/dL   GFR, Estimated >28 >78 mL/min   Anion gap 14 5 - 15  hCG, quantitative, pregnancy     Status: Abnormal   Collection Time: 08/11/21  9:33 PM  Result Value Ref Range   hCG, Beta Chain, Quant, S 95,501 (H) <5 mIU/mL     --/--/A POS Performed at [facility], [address], [place], [place] [address]  [phone])  IMAGING US OB Transvaginal  Result Date: 08/11/2021 CLINICAL DATA:  Intrauterine pregnancy, viability assessment. EXAM: OBSTETRIC <14 WK ULTRASOUND TECHNIQUE: Transabdominal ultrasound was performed for evaluation of the gestation as well as the maternal uterus and adnexal regions. COMPARISON:  Ultrasound August 05, 2021. FINDINGS: Intrauterine gestational sac: Single Yolk sac:  Not Visualized. Embryo:  Not Visualized. Cardiac Activity: Not Visualized. MSD:  23.2 mm   7 w   2 d Subchorionic hemorrhage:  None visualized. Maternal uterus/adnexae: Simple cyst in the right ovary. Left ovary is not visualized. Trace pelvic free fluid. IMPRESSION: Interval increase in size in the intrauterine gestational sac now measuring 23.2 mm still without visualization of a yolk sac or embryo, findings which are suspicious for but not diagnostic of pregnancy failure. Recommend serial beta HCG and follow-up ultrasound in 7 days to assess for continued development and viability. Electronically Signed   By: Maudry Mayhew M.D.   On: 08/11/2021 22:35     MAU Management/MDM: Ordered followup HCG which did rise but did not  double since 7 days ago (49,428 to 95,501) Followup Ultrasound to rule out ectopic.>> Gestational sac is bigger but there is no yolk sac or embryo.  Radiologist states it is suggestive but not diagnostic for failed pregnancy.  Will schedule followup US next week  BMET showed hypokalemia at 2.7.  Consult Dr Debroah Loop with presentation, exam findings, and results.   Treatments in MAU included IV hydration, antiemetics, Pepcid, and IV KCl supplements x ..  She felt better and was able to tolerate PO intake  ASSESSMENT Pregnancy at [redacted]w[redacted]d by LMP Slow rise in HCG level with gestational sac seen, larger than last week Pregnancy of uncertain viability Hyperemesis Hypokalemia Dehydration  PLAN Discharge home Has antiemetics at home Rx Robinul for ptyalism Rx K-Dur bid x 2d  Advance diet as tolerated Will repeat  Ultrasound as scheduled Ectopic/SAB precautions  Pt stable at time of discharge. Encouraged to return here if she develops worsening of symptoms, increase in pain, fever, or other concerning symptoms.    Wynelle Bourgeois CNM, MSN Certified Nurse-Midwife 08/11/2021  9:08 PM

## 2021-08-11 NOTE — MAU Note (Signed)
CRITICAL VALUE STICKER  CRITICAL VALUE:2.7 K  RECEIVER (on-site recipient of call):Gerado Nabers  DATE & TIME NOTIFIED: 2235  MESSENGER (representative from lab):  MD NOTIFIED: Hilda Lias CNM  TIME OF NOTIFICATION:2237  RESPONSE:  See new orders

## 2021-08-11 NOTE — MAU Note (Signed)
Pt c/o N/V/D since 08/02/21, N/V without relief and unable to keep anything down, pt reported vaginal spotting this morning but none seen this afternoon, safety maintained.

## 2021-08-12 MED ORDER — POTASSIUM CHLORIDE 20 MEQ PO PACK
20.0000 meq | PACK | Freq: Once | ORAL | Status: DC
  Filled 2021-08-12: qty 1

## 2021-08-12 MED ORDER — POTASSIUM CHLORIDE CRYS ER 20 MEQ PO TBCR: 20.0000 meq | EXTENDED_RELEASE_TABLET | Freq: Two times a day (BID) | ORAL | 0 refills | Status: DC

## 2021-08-12 MED ORDER — GLYCOPYRROLATE 1 MG PO TABS: 1.0000 mg | ORAL_TABLET | Freq: Three times a day (TID) | ORAL | 0 refills | Status: DC | PRN

## 2021-08-25 ENCOUNTER — Encounter: Payer: Self-pay | Admitting: *Deleted

## 2021-11-06 ENCOUNTER — Encounter (HOSPITAL_COMMUNITY): Payer: Self-pay

## 2021-11-06 ENCOUNTER — Other Ambulatory Visit: Payer: Self-pay

## 2021-11-06 ENCOUNTER — Emergency Department (HOSPITAL_COMMUNITY)
Admission: EM | Admit: 2021-11-06 | Discharge: 2021-11-06 | Disposition: A | Discharge status: Home / Self Care | Payer: Medicaid Other | Attending: Emergency Medicine | Admitting: Emergency Medicine

## 2021-11-06 DIAGNOSIS — J45909 Unspecified asthma, uncomplicated: Secondary | ICD-10-CM | POA: Insufficient documentation

## 2021-11-06 DIAGNOSIS — K0889 Other specified disorders of teeth and supporting structures: Secondary | ICD-10-CM | POA: Diagnosis present

## 2021-11-06 LAB — PREGNANCY, URINE: Preg Test, Ur: NEGATIVE

## 2021-11-06 MED ORDER — HYDROCODONE-ACETAMINOPHEN 5-325 MG PO TABS: 1.0000 | ORAL_TABLET | ORAL | 0 refills | Status: DC | PRN

## 2021-11-06 MED ORDER — LIDOCAINE VISCOUS HCL 2 % MT SOLN: 15.0000 mL | OROMUCOSAL | 0 refills | Status: DC | PRN

## 2021-11-06 MED ORDER — OXYCODONE-ACETAMINOPHEN 5-325 MG PO TABS
2.0000 | ORAL_TABLET | Freq: Once | ORAL | Status: AC
  Administered 2021-11-06: 2 via ORAL
  Filled 2021-11-06: qty 2

## 2021-11-06 MED ORDER — CLINDAMYCIN HCL 150 MG PO CAPS: 450.0000 mg | ORAL_CAPSULE | Freq: Three times a day (TID) | ORAL | 0 refills | Status: AC

## 2021-11-06 NOTE — Discharge Instructions (Addendum)
You were seen here today for evaluation of your dental pain. Attached are some dental resources. Please follow up with a dentist soon as this will likely need extraction. You can place dental or orthodontic wax over the tooth before eating. I am prescribing you a few pills of narcotic pain medication to take as needed for pain. You can take these with 600mg  of ibuprofen for pain as needed. Please do not drive or operate heavy machinery while on this medication. If you have any fevers, facial swelling, pain out of proportion, please return to the nearest ER for re-evaluation.

## 2021-11-06 NOTE — ED Triage Notes (Signed)
Patient coming in with dental pain the last 3 days. Now her face, ear, cheek sensitive to touch. She said there is a hole in her tooth.

## 2021-11-06 NOTE — ED Provider Notes (Signed)
[facility]-EMERGENCY DEPT Provider Note   CSN: [id number] Arrival date & time: 11/06/21  1933     History No chief complaint on file.   Drake Rozalyn Osland is a 31 y.o. female presents to the ED for evaluation of right lower back dental pain for the past 3 days.  She denies any fevers or free swelling.  Denies any shortness of breath or trouble swallowing.  The patient does not have a dentist, but is calling around.  Medical history includes asthma.  Allergic to penicillin.  Daily tobacco user.  The patient recently had an elective abortion and denies chance of pregnancy although LMP was 06-28-2021.   HPI     Home Medications Prior to Admission medications   Medication Sig Start Date End Date Taking? Authorizing Provider  famotidine (PEPCID) 20 MG tablet Take 1 tablet (20 mg total) by mouth 2 (two) times daily. 08/08/21   Rolm Bookbinder, CNM  glycopyrrolate (ROBINUL) 1 MG tablet Take 1 tablet (1 mg total) by mouth every 8 (eight) hours as needed (excess saliva). 08/12/21   Aviva Signs, CNM  metoCLOPramide (REGLAN) 10 MG tablet Take 1 tablet (10 mg total) by mouth every 6 (six) hours. 08/08/21   Rolm Bookbinder, CNM  ondansetron (ZOFRAN ODT) 8 MG disintegrating tablet Take 1 tablet (8 mg total) by mouth every 8 (eight) hours as needed for nausea or vomiting. 08/08/21   Rolm Bookbinder, CNM  potassium chloride SA (KLOR-CON) 20 MEQ tablet Take 1 tablet (20 mEq total) by mouth every 12 (twelve) hours for 2 days. 08/12/21 08/14/21  Aviva Signs, CNM  promethazine (PHENERGAN) 25 MG suppository Place 1 suppository (25 mg total) rectally every 6 (six) hours as needed for nausea or vomiting. 08/08/21   Rolm Bookbinder, CNM  promethazine (PHENERGAN) 25 MG tablet Take 1 tablet (25 mg total) by mouth every 6 (six) hours as needed for nausea or vomiting. 08/05/21   Judeth Horn, NP  scopolamine (TRANSDERM-SCOP, 1.5 MG,) 1 MG/3DAYS Place 1 patch (1.5 mg total)  onto the skin every 3 (three) days. 08/08/21   Rolm Bookbinder, CNM      Allergies    Lac bovis and Penicillins    Review of Systems   Review of Systems  Constitutional:  Negative for fever.  HENT:  Positive for dental problem. Negative for congestion, facial swelling, sore throat and trouble swallowing.   All other systems reviewed and are negative.  Physical Exam Updated Vital Signs BP (!) 156/92 (BP Location: Left Arm)    Pulse 73    Temp 98.3 F (36.8 C) (Oral) Comment: 1G tylenol @ 01:00   Resp 16    Ht 5\' 4"  (1.626 m)    Wt 106.6 kg    LMP 06/28/2021    SpO2 100%    BMI 40.34 kg/m  Physical Exam Vitals and nursing note reviewed.  Constitutional:      Appearance: Normal appearance.  HENT:     Head: Normocephalic and atraumatic.     Comments: No facial swelling noted.    Mouth/Throat:     Mouth: Mucous membranes are moist.     Pharynx: No oropharyngeal exudate or posterior oropharyngeal erythema.     Comments: Multiple dental caries present in the lower back right molar.  No surrounding erythema.  No surrounding induration or fluctuance present. Eyes:     General: No scleral icterus. Pulmonary:     Effort: Pulmonary effort is normal. No respiratory distress.  Skin:    General: Skin is dry.     Findings: No rash.  Neurological:     General: No focal deficit present.     Mental Status: She is alert. Mental status is at baseline.  Psychiatric:        Mood and Affect: Mood normal.    ED Results / Procedures / Treatments   Labs (all labs ordered are listed, but only abnormal results are displayed) Labs Reviewed  PREGNANCY, URINE    EKG None  Radiology No results found.  Procedures Procedures   Medications Ordered in ED Medications - No data to display  ED Course/ Medical Decision Making/ A&P                           Medical Decision Making Amount and/or Complexity of Data Reviewed Labs: ordered.  Risk Prescription drug  management.   31 year old female presents emergency department for evaluation of right lower dental pain for the past 3 days.  Obvious dental carry to the lower right tooth.  Since patient is allergic to penicillin we will place patient on clindamycin which is also safe with pregnancy.  We will also give viscous lidocaine and Norco pain medication.  PDMP checked.  Pregnancy test was negative in the emergency department as well.  I recommended that she use orthodontic or dental wax over the tooth in the meantime, but will ultimately need repaired by dentist.  Return precautions discussed with the patient.  Patient agrees to plan.  Patient is stable being discharged home in good condition.  Final Clinical Impression(s) / ED Diagnoses Final diagnoses:  Pain, dental    Rx / DC Orders ED Discharge Orders          Ordered    clindamycin (CLEOCIN) 150 MG capsule  3 times daily        11/06/21 2201    lidocaine (XYLOCAINE) 2 % solution  As needed        11/06/21 2201    HYDROcodone-acetaminophen (NORCO/VICODIN) 5-325 MG tablet  Every 4 hours PRN        11/06/21 2206              Achille Rich, PA-C 11/09/21 2111    Arby Barrette, MD 11/13/21 [phone]

## 2021-12-09 ENCOUNTER — Other Ambulatory Visit: Payer: Self-pay

## 2021-12-09 ENCOUNTER — Inpatient Hospital Stay (HOSPITAL_COMMUNITY)
Admission: EM | Admit: 2021-12-09 | Discharge: 2021-12-10 | DRG: 194 | Discharge status: Against Medical Advice | Payer: Medicaid Other | Attending: Internal Medicine | Admitting: Internal Medicine

## 2021-12-09 ENCOUNTER — Encounter (HOSPITAL_COMMUNITY): Payer: Self-pay | Admitting: Emergency Medicine

## 2021-12-09 ENCOUNTER — Emergency Department (HOSPITAL_COMMUNITY): Payer: Medicaid Other

## 2021-12-09 DIAGNOSIS — J1001 Influenza due to other identified influenza virus with the same other identified influenza virus pneumonia: Principal | ICD-10-CM | POA: Diagnosis present

## 2021-12-09 DIAGNOSIS — Z825 Family history of asthma and other chronic lower respiratory diseases: Secondary | ICD-10-CM

## 2021-12-09 DIAGNOSIS — Z87891 Personal history of nicotine dependence: Secondary | ICD-10-CM

## 2021-12-09 DIAGNOSIS — E66813 Obesity, class 3: Secondary | ICD-10-CM

## 2021-12-09 DIAGNOSIS — Z20822 Contact with and (suspected) exposure to covid-19: Secondary | ICD-10-CM | POA: Diagnosis present

## 2021-12-09 DIAGNOSIS — R0902 Hypoxemia: Secondary | ICD-10-CM

## 2021-12-09 DIAGNOSIS — J1100 Influenza due to unidentified influenza virus with unspecified type of pneumonia: Secondary | ICD-10-CM

## 2021-12-09 DIAGNOSIS — Z6841 Body Mass Index (BMI) 40.0 and over, adult: Secondary | ICD-10-CM

## 2021-12-09 DIAGNOSIS — J441 Chronic obstructive pulmonary disease with (acute) exacerbation: Secondary | ICD-10-CM

## 2021-12-09 DIAGNOSIS — Z880 Allergy status to penicillin: Secondary | ICD-10-CM

## 2021-12-09 DIAGNOSIS — Z79899 Other long term (current) drug therapy: Secondary | ICD-10-CM

## 2021-12-09 DIAGNOSIS — Z5329 Procedure and treatment not carried out because of patient's decision for other reasons: Secondary | ICD-10-CM | POA: Diagnosis present

## 2021-12-09 DIAGNOSIS — Z8041 Family history of malignant neoplasm of ovary: Secondary | ICD-10-CM

## 2021-12-09 DIAGNOSIS — E876 Hypokalemia: Secondary | ICD-10-CM | POA: Diagnosis present

## 2021-12-09 DIAGNOSIS — J45901 Unspecified asthma with (acute) exacerbation: Secondary | ICD-10-CM | POA: Diagnosis present

## 2021-12-09 DIAGNOSIS — J09X1 Influenza due to identified novel influenza A virus with pneumonia: Secondary | ICD-10-CM | POA: Diagnosis present

## 2021-12-09 DIAGNOSIS — Z720 Tobacco use: Secondary | ICD-10-CM

## 2021-12-09 DIAGNOSIS — Z888 Allergy status to other drugs, medicaments and biological substances status: Secondary | ICD-10-CM

## 2021-12-09 LAB — BASIC METABOLIC PANEL
Anion gap: 12 (ref 5–15)
BUN: 19 mg/dL (ref 6–20)
CO2: 19 mmol/L — ABNORMAL LOW (ref 22–32)
Calcium: 9 mg/dL (ref 8.9–10.3)
Chloride: 104 mmol/L (ref 98–111)
Creatinine, Ser: 0.76 mg/dL (ref 0.44–1.00)
GFR, Estimated: >60 mL/min (ref >60)
Glucose, Bld: 116 mg/dL — ABNORMAL HIGH (ref 70–99)
Potassium: 2.9 mmol/L — ABNORMAL LOW (ref 3.5–5.1)
Sodium: 135 mmol/L (ref 135–145)

## 2021-12-09 LAB — CBC
HCT: 42.7 % (ref 36.0–46.0)
Hemoglobin: 14.5 g/dL (ref 12.0–15.0)
MCH: 29.8 pg (ref 26.0–34.0)
MCHC: 34 g/dL (ref 30.0–36.0)
MCV: 87.7 fL (ref 80.0–100.0)
Platelets: 125 10*3/uL — ABNORMAL LOW (ref 150–400)
RBC: 4.87 MIL/uL (ref 3.87–5.11)
RDW: 12.9 % (ref 11.5–15.5)
WBC: 6 10*3/uL (ref 4.0–10.5)
nRBC: 0 % (ref 0.0–0.2)

## 2021-12-09 LAB — I-STAT BETA HCG BLOOD, ED (MC, WL, AP ONLY): I-stat hCG, quantitative: <5 m[IU]/mL (ref <5)

## 2021-12-09 MED ORDER — IPRATROPIUM-ALBUTEROL 0.5-2.5 (3) MG/3ML IN SOLN
3.0000 mL | Freq: Once | RESPIRATORY_TRACT | Status: AC
  Administered 2021-12-09: 3 mL via RESPIRATORY_TRACT
  Filled 2021-12-09: qty 3

## 2021-12-09 MED ORDER — POTASSIUM CHLORIDE CRYS ER 20 MEQ PO TBCR
40.0000 meq | EXTENDED_RELEASE_TABLET | Freq: Once | ORAL | Status: AC
  Administered 2021-12-10: 40 meq via ORAL
  Filled 2021-12-09: qty 2

## 2021-12-09 MED ORDER — METHYLPREDNISOLONE SODIUM SUCC 125 MG IJ SOLR
125.0000 mg | Freq: Once | INTRAMUSCULAR | Status: AC
  Administered 2021-12-09: 125 mg via INTRAVENOUS
  Filled 2021-12-09: qty 2

## 2021-12-09 NOTE — ED Triage Notes (Signed)
Pt hx of asthma, called out for SOB, 93% on home neb upon EMS arrival, c/o flu-like symptoms x 2 days, has been taking albuterol nebs 7.5mg , given 0.5 of Atrovent and 5mg  Albuterol, 125mg  Solumedrol by EMS, CBG 98, HR 130s

## 2021-12-09 NOTE — ED Notes (Signed)
Pt. Given warm blanket. °

## 2021-12-09 NOTE — ED Provider Notes (Signed)
[facility]-EMERGENCY DEPT Provider Note   CSN: [id number] Arrival date & time: 12/09/21  2147     History  Chief Complaint  Patient presents with   Shortness of Breath    Mercedes [name] is a 31 y.o. female.   Shortness of Breath Associated symptoms: fever    Patient presented to the ED for evaluation of shortness of breath.  Patient has history of asthma.  She started having trouble with cough and shortness of breath earlier this week.  Her symptoms have persisted and worsened tonight.  She was having increased shortness of breath.  Patient also has been having body aches.  Earlier in the week she had fevers but those have resolved.  She denies any history of heart disease.  No history of PE or DVT  Home Medications Prior to Admission medications   Medication Sig Start Date End Date Taking? Authorizing Provider  famotidine (PEPCID) 20 MG tablet Take 1 tablet (20 mg total) by mouth 2 (two) times daily. 08/08/21   Rolm Bookbinder, CNM  glycopyrrolate (ROBINUL) 1 MG tablet Take 1 tablet (1 mg total) by mouth every 8 (eight) hours as needed (excess saliva). 08/12/21   Aviva Signs, CNM  HYDROcodone-acetaminophen (NORCO/VICODIN) 5-325 MG tablet Take 1 tablet by mouth every 4 (four) hours as needed. 11/06/21   Achille Rich, PA-C  lidocaine (XYLOCAINE) 2 % solution Use as directed 15 mLs in the mouth or throat as needed for mouth pain. 11/06/21   Achille Rich, PA-C  metoCLOPramide (REGLAN) 10 MG tablet Take 1 tablet (10 mg total) by mouth every 6 (six) hours. 08/08/21   Rolm Bookbinder, CNM  ondansetron (ZOFRAN ODT) 8 MG disintegrating tablet Take 1 tablet (8 mg total) by mouth every 8 (eight) hours as needed for nausea or vomiting. 08/08/21   Rolm Bookbinder, CNM  potassium chloride SA (KLOR-CON) 20 MEQ tablet Take 1 tablet (20 mEq total) by mouth every 12 (twelve) hours for 2 days. 08/12/21 08/14/21  Aviva Signs, CNM  promethazine (PHENERGAN) 25  MG suppository Place 1 suppository (25 mg total) rectally every 6 (six) hours as needed for nausea or vomiting. 08/08/21   Rolm Bookbinder, CNM  promethazine (PHENERGAN) 25 MG tablet Take 1 tablet (25 mg total) by mouth every 6 (six) hours as needed for nausea or vomiting. 08/05/21   Judeth Horn, NP  scopolamine (TRANSDERM-SCOP, 1.5 MG,) 1 MG/3DAYS Place 1 patch (1.5 mg total) onto the skin every 3 (three) days. 08/08/21   Rolm Bookbinder, CNM      Allergies    Lac bovis and Penicillins    Review of Systems   Review of Systems  Constitutional:  Positive for fever.  Respiratory:  Positive for shortness of breath.    Physical Exam Updated Vital Signs BP 122/78    Pulse (!) 121    Temp 98.8 F (37.1 C) (Oral)    Resp (!) 25    Ht 1.626 m (5\' 4" )    Wt 106.6 kg    LMP 11/24/2021    SpO2 93%    BMI 40.34 kg/m  Physical Exam Vitals and nursing note reviewed.  Constitutional:      General: She is not in acute distress.    Appearance: She is well-developed.  HENT:     Head: Normocephalic and atraumatic.     Right Ear: External ear normal.     Left Ear: External ear normal.  Eyes:     General:  No scleral icterus.       Right eye: No discharge.        Left eye: No discharge.     Conjunctiva/sclera: Conjunctivae normal.  Neck:     Trachea: No tracheal deviation.  Cardiovascular:     Rate and Rhythm: Normal rate and regular rhythm.  Pulmonary:     Effort: Tachypnea present. No respiratory distress.     Breath sounds: No stridor. Wheezing present. No rales.  Abdominal:     General: Bowel sounds are normal. There is no distension.     Palpations: Abdomen is soft.     Tenderness: There is no abdominal tenderness. There is no guarding or rebound.  Musculoskeletal:        General: No tenderness or deformity.     Cervical back: Neck supple.  Skin:    General: Skin is warm and dry.     Findings: No rash.  Neurological:     General: No focal deficit present.     Mental Status:  She is alert.     Cranial Nerves: No cranial nerve deficit (no facial droop, extraocular movements intact, no slurred speech).     Sensory: No sensory deficit.     Motor: No abnormal muscle tone or seizure activity.     Coordination: Coordination normal.  Psychiatric:        Mood and Affect: Mood normal.    ED Results / Procedures / Treatments   Labs (all labs ordered are listed, but only abnormal results are displayed) Labs Reviewed  CBC - Abnormal; Notable for the following components:      Result Value   Platelets 125 (*)    All other components within normal limits  BASIC METABOLIC PANEL - Abnormal; Notable for the following components:   Potassium 2.9 (*)    CO2 19 (*)    Glucose, Bld 116 (*)    All other components within normal limits  RESP PANEL BY RT-PCR (FLU A&B, COVID) ARPGX2  D-DIMER, QUANTITATIVE  I-STAT BETA HCG BLOOD, ED ([facility], [facility], [facility] ONLY)    EKG EKG Interpretation  Date/Time:  Thursday December 09 2021 22:14:02 EST Ventricular Rate:  136 PR Interval:  125 QRS Duration: 91 QT Interval:  313 QTC Calculation: 471 R Axis:   90 Text Interpretation: Sinus tachycardia Borderline right axis deviation Borderline Q waves in lateral leads Borderline ST depression, inferior leads Abnormal T, consider ischemia, diffuse leads Baseline wander in lead(s) V1 Since last tracing rate faster Confirmed by Linwood Dibbles [phone]) on 12/09/2021 11:49:02 PM  Radiology DG Chest Portable 1 View  Result Date: 12/09/2021 CLINICAL DATA:  Dyspnea, history of asthma. EXAM: PORTABLE CHEST 1 VIEW COMPARISON:  02/27/2016 FINDINGS: The heart size and mediastinal contours are within normal limits. Both lungs are clear. No acute osseous abnormality. IMPRESSION: No active disease. Electronically Signed   By: Thornell Sartorius M.D.   On: 12/09/2021 22:59    Procedures .1-3 Lead EKG Interpretation Performed by: Linwood Dibbles, MD Authorized by: Linwood Dibbles, MD     Interpretation: normal     ECG rate:   110   ECG rate assessment: tachycardic     Rhythm: sinus rhythm     Ectopy: none     Conduction: normal   Comments:     12:06 AM     Medications Ordered in ED Medications  potassium chloride SA (KLOR-CON M) CR tablet 40 mEq (has no administration in time range)  morphine (PF) 4 MG/ML injection 4 mg (has  no administration in time range)  albuterol (PROVENTIL,VENTOLIN) solution continuous neb (has no administration in time range)  magnesium sulfate IVPB 2 g 50 mL (has no administration in time range)  methylPREDNISolone sodium succinate (SOLU-MEDROL) 125 mg/2 mL injection 125 mg (125 mg Intravenous Given 12/09/21 2257)  ipratropium-albuterol (DUONEB) 0.5-2.5 (3) MG/3ML nebulizer solution 3 mL (3 mLs Nebulization Given 12/09/21 2300)    ED Course/ Medical Decision Making/ A&P Clinical Course as of 12/10/21 0009  Thu Dec 09, 2021  2347 Basic metabolic panel(!) Hypokalemia noted.  Will replace [JK]  2347 CBC(!) [JK]  2347 Chest x-ray images and radiology report reviewed.  No acute findings [JK]  Fri Dec 10, 2021  0005 Patient still wheezing on exam.  Will order an additional hour long neb.  With her tachycardia and chest pain we will also add a D-dimer although I suspect this is related to bronchospasm [JK]    Clinical Course User Index [JK] Linwood Dibbles, MD                           Medical Decision Making Amount and/or Complexity of Data Reviewed Labs: ordered. Decision-making details documented in ED Course. Radiology: ordered.  Risk Prescription drug management.   Patient presented to the ED with complaints of shortness of breath.  Significant wheezing noted on exam.  Patient treated with IV steroids and DuoNeb.  Some improvement after initial treatment but wheezing has persisted.  We will proceed with additional hour-long neb.  Patient does not improve significantly may end up requiring admission.  Care turned over to Dr Madilyn Hook at shift change.        Final Clinical  Impression(s) / ED Diagnoses Final diagnoses:  Severe asthma with exacerbation, unspecified whether persistent  Hypokalemia      Linwood Dibbles, MD 12/10/21 0009

## 2021-12-10 ENCOUNTER — Encounter (HOSPITAL_COMMUNITY): Payer: Self-pay

## 2021-12-10 ENCOUNTER — Emergency Department (HOSPITAL_COMMUNITY): Payer: Medicaid Other

## 2021-12-10 DIAGNOSIS — Z5329 Procedure and treatment not carried out because of patient's decision for other reasons: Secondary | ICD-10-CM | POA: Diagnosis present

## 2021-12-10 DIAGNOSIS — J1001 Influenza due to other identified influenza virus with the same other identified influenza virus pneumonia: Secondary | ICD-10-CM | POA: Diagnosis present

## 2021-12-10 DIAGNOSIS — Z6841 Body Mass Index (BMI) 40.0 and over, adult: Secondary | ICD-10-CM | POA: Diagnosis not present

## 2021-12-10 DIAGNOSIS — R0602 Shortness of breath: Secondary | ICD-10-CM | POA: Diagnosis present

## 2021-12-10 DIAGNOSIS — Z888 Allergy status to other drugs, medicaments and biological substances status: Secondary | ICD-10-CM | POA: Diagnosis not present

## 2021-12-10 DIAGNOSIS — Z825 Family history of asthma and other chronic lower respiratory diseases: Secondary | ICD-10-CM | POA: Diagnosis not present

## 2021-12-10 DIAGNOSIS — E876 Hypokalemia: Secondary | ICD-10-CM | POA: Diagnosis present

## 2021-12-10 DIAGNOSIS — J09X1 Influenza due to identified novel influenza A virus with pneumonia: Secondary | ICD-10-CM

## 2021-12-10 DIAGNOSIS — J45901 Unspecified asthma with (acute) exacerbation: Secondary | ICD-10-CM | POA: Diagnosis present

## 2021-12-10 DIAGNOSIS — Z20822 Contact with and (suspected) exposure to covid-19: Secondary | ICD-10-CM | POA: Diagnosis present

## 2021-12-10 DIAGNOSIS — Z87891 Personal history of nicotine dependence: Secondary | ICD-10-CM | POA: Diagnosis not present

## 2021-12-10 DIAGNOSIS — Z79899 Other long term (current) drug therapy: Secondary | ICD-10-CM | POA: Diagnosis not present

## 2021-12-10 DIAGNOSIS — Z720 Tobacco use: Secondary | ICD-10-CM

## 2021-12-10 DIAGNOSIS — Z8041 Family history of malignant neoplasm of ovary: Secondary | ICD-10-CM | POA: Diagnosis not present

## 2021-12-10 DIAGNOSIS — Z880 Allergy status to penicillin: Secondary | ICD-10-CM | POA: Diagnosis not present

## 2021-12-10 LAB — CBC WITH DIFFERENTIAL/PLATELET
Abs Immature Granulocytes: 0 10*3/uL (ref 0.00–0.07)
Basophils Absolute: 0 10*3/uL (ref 0.0–0.1)
Basophils Relative: 0 %
Eosinophils Absolute: 0 10*3/uL (ref 0.0–0.5)
Eosinophils Relative: 0 %
HCT: 39 % (ref 36.0–46.0)
Hemoglobin: 13.4 g/dL (ref 12.0–15.0)
Immature Granulocytes: 0 %
Lymphocytes Relative: 17 %
Lymphs Abs: 0.5 10*3/uL — ABNORMAL LOW (ref 0.7–4.0)
MCH: 30.2 pg (ref 26.0–34.0)
MCHC: 34.4 g/dL (ref 30.0–36.0)
MCV: 88 fL (ref 80.0–100.0)
Monocytes Absolute: 0.2 10*3/uL (ref 0.1–1.0)
Monocytes Relative: 7 %
Neutro Abs: 2.4 10*3/uL (ref 1.7–7.7)
Neutrophils Relative %: 76 %
Platelets: 147 10*3/uL — ABNORMAL LOW (ref 150–400)
RBC: 4.43 MIL/uL (ref 3.87–5.11)
RDW: 12.9 % (ref 11.5–15.5)
WBC: 3.1 10*3/uL — ABNORMAL LOW (ref 4.0–10.5)
nRBC: 0 % (ref 0.0–0.2)

## 2021-12-10 LAB — COMPREHENSIVE METABOLIC PANEL
ALT: 19 U/L (ref 0–44)
AST: 25 U/L (ref 15–41)
Albumin: 3.9 g/dL (ref 3.5–5.0)
Alkaline Phosphatase: 50 U/L (ref 38–126)
Anion gap: 7 (ref 5–15)
BUN: 15 mg/dL (ref 6–20)
CO2: 22 mmol/L (ref 22–32)
Calcium: 9.1 mg/dL (ref 8.9–10.3)
Chloride: 105 mmol/L (ref 98–111)
Creatinine, Ser: 0.63 mg/dL (ref 0.44–1.00)
GFR, Estimated: >60 mL/min (ref >60)
Glucose, Bld: 145 mg/dL — ABNORMAL HIGH (ref 70–99)
Potassium: 3.8 mmol/L (ref 3.5–5.1)
Sodium: 134 mmol/L — ABNORMAL LOW (ref 135–145)
Total Bilirubin: 0.4 mg/dL (ref 0.3–1.2)
Total Protein: 8.1 g/dL (ref 6.5–8.1)

## 2021-12-10 LAB — HEPATIC FUNCTION PANEL
ALT: 20 U/L (ref 0–44)
AST: 26 U/L (ref 15–41)
Albumin: 4.1 g/dL (ref 3.5–5.0)
Alkaline Phosphatase: 57 U/L (ref 38–126)
Bilirubin, Direct: 0.1 mg/dL (ref 0.0–0.2)
Indirect Bilirubin: 0.3 mg/dL (ref 0.3–0.9)
Total Bilirubin: 0.4 mg/dL (ref 0.3–1.2)
Total Protein: 8.4 g/dL — ABNORMAL HIGH (ref 6.5–8.1)

## 2021-12-10 LAB — RESP PANEL BY RT-PCR (FLU A&B, COVID) ARPGX2
Influenza A by PCR: POSITIVE — AB
Influenza B by PCR: NEGATIVE
SARS Coronavirus 2 by RT PCR: NEGATIVE

## 2021-12-10 LAB — D-DIMER, QUANTITATIVE: D-Dimer, Quant: 0.52 ug/mL-FEU — ABNORMAL HIGH (ref 0.00–0.50)

## 2021-12-10 LAB — MAGNESIUM: Magnesium: 2.6 mg/dL — ABNORMAL HIGH (ref 1.7–2.4)

## 2021-12-10 LAB — LIPASE, BLOOD: Lipase: 28 U/L (ref 11–51)

## 2021-12-10 MED ORDER — ALBUTEROL SULFATE (2.5 MG/3ML) 0.083% IN NEBU
10.0000 mg/h | INHALATION_SOLUTION | Freq: Once | RESPIRATORY_TRACT | Status: AC
  Administered 2021-12-10: 10 mg/h via RESPIRATORY_TRACT
  Filled 2021-12-10: qty 3

## 2021-12-10 MED ORDER — ALBUTEROL SULFATE (2.5 MG/3ML) 0.083% IN NEBU
5.0000 mg | INHALATION_SOLUTION | Freq: Once | RESPIRATORY_TRACT | Status: AC
  Administered 2021-12-10: 5 mg via RESPIRATORY_TRACT
  Filled 2021-12-10: qty 6

## 2021-12-10 MED ORDER — ACETAMINOPHEN 650 MG RE SUPP: 650.0000 mg | Freq: Four times a day (QID) | RECTAL | Status: DC | PRN

## 2021-12-10 MED ORDER — MORPHINE SULFATE (PF) 4 MG/ML IV SOLN
4.0000 mg | Freq: Once | INTRAVENOUS | Status: AC
  Administered 2021-12-10: 4 mg via INTRAVENOUS
  Filled 2021-12-10: qty 1

## 2021-12-10 MED ORDER — LACTATED RINGERS IV BOLUS
1000.0000 mL | Freq: Once | INTRAVENOUS | Status: AC
Start: 2021-12-10 — End: 2021-12-10
  Administered 2021-12-10 (×2): 1000 mL via INTRAVENOUS

## 2021-12-10 MED ORDER — SODIUM CHLORIDE (PF) 0.9 % IJ SOLN
INTRAMUSCULAR | Status: AC
  Filled 2021-12-10: qty 50

## 2021-12-10 MED ORDER — IOHEXOL 350 MG/ML SOLN
100.0000 mL | Freq: Once | INTRAVENOUS | Status: AC | PRN
  Administered 2021-12-10: 100 mL via INTRAVENOUS

## 2021-12-10 MED ORDER — IPRATROPIUM BROMIDE 0.02 % IN SOLN
0.5000 mg | Freq: Four times a day (QID) | RESPIRATORY_TRACT | Status: DC
  Administered 2021-12-10: 0.5 mg via RESPIRATORY_TRACT
  Filled 2021-12-10: qty 2.5

## 2021-12-10 MED ORDER — OSELTAMIVIR PHOSPHATE 75 MG PO CAPS
75.0000 mg | ORAL_CAPSULE | Freq: Two times a day (BID) | ORAL | Status: DC
  Administered 2021-12-10: 75 mg via ORAL
  Filled 2021-12-10: qty 1

## 2021-12-10 MED ORDER — ACETAMINOPHEN 325 MG PO TABS
650.0000 mg | ORAL_TABLET | Freq: Four times a day (QID) | ORAL | Status: DC | PRN
  Administered 2021-12-10: 650 mg via ORAL
  Filled 2021-12-10: qty 2

## 2021-12-10 MED ORDER — MAGNESIUM SULFATE 2 GM/50ML IV SOLN
2.0000 g | Freq: Once | INTRAVENOUS | Status: AC
  Administered 2021-12-10: 2 g via INTRAVENOUS
  Filled 2021-12-10: qty 50

## 2021-12-10 NOTE — ED Provider Notes (Signed)
Patient care assumed at 0000.  Patient with history of asthma here for evaluation of increased shortness of breath, wheezing.  Patient care assumed pending viral panel, D-dimer, reassessment.  Viral panel returned positive for influenza A.  Her D-dimer is mildly elevated.  On reassessment patient with persistent wheezing, tachycardia, treated with additional neb.  Plan to obtain CTA to rule out PE.  CTA returned negative for PE but does demonstrate multifocal pneumonia.  Suspect this is influenza pneumonia and not bacterial pneumonia.  On reassessment patient with persistent tachypnea, feels partially improved.  She does have 2 L oxygen requirement.  Without supplemental oxygen sats dropped to 88% on room air.  Discussed with patient findings of studies and recommendation for admission.  Medicine consulted for admission.   Tilden Fossa, MD 12/10/21 [phone]

## 2021-12-10 NOTE — H&P (Signed)
History and Physical    Patient: Mercedes [name] DDU:[id number] DOB: 04-06-1991 DOA: 12/09/2021 DOS: the patient was seen and examined on 12/10/2021 PCP: Patient, No Pcp Per (Inactive)  Patient coming from: Home  Chief Complaint:  Chief Complaint  Patient presents with   Shortness of Breath    HPI: Mercedes [name] is a 31 y.o. female with medical history significant of asthma, tobacco abuse. Presenting with dyspnea. She reports that she had 3 days of chills, body aches, and fever. She tried theraflu, but it didn't help. Last night she had an asthma attack. I did not respond to multiple albuterol treatments. She became concerned and decided to come to the ED for help. She has not had her flu shot this year. She denies any sick contacts. She denies any other aggravating or alleviating factors.    Review of Systems: As mentioned in the history of present illness. All other systems reviewed and are negative. Past Medical History:  Diagnosis Date   Anemia    Asthma    Hypokalemia    Kidney stone 2018   Past Surgical History:  Procedure Laterality Date   NO PAST SURGERIES     WISDOM TOOTH EXTRACTION     Social History:  reports that she quit smoking about 2 years ago. Her smoking use included cigarettes. She has never used smokeless tobacco. She reports that she does not currently use drugs after having used the following drugs: Marijuana. She reports that she does not drink alcohol.  Allergies  Allergen Reactions   Penicillins Other (See Comments)    Reaction:  Unknown  Has patient had a PCN reaction causing immediate rash, facial/tongue/throat swelling, SOB or lightheadedness with hypotension: Unknown Has patient had a PCN reaction causing severe rash involving mucus membranes or skin necrosis: Unknown Has patient had a PCN reaction that required hospitalization: Unknown Has patient had a PCN reaction occurring within the last 10 years: No If all of the above answers are  "NO", then may proceed with Cephalosporin use. Unknown reaction Childhood allergy    Family History  Problem Relation Age of Onset   Cancer Mother        ovarian   Asthma Mother     Prior to Admission medications   Not on File    Physical Exam: Vitals:   12/10/21 0215 12/10/21 0450 12/10/21 0600 12/10/21 0735  BP: 101/77 123/78 123/70 (!) 109/59  Pulse: (!) 134 (!) 113 (!) 106 93  Resp: (!) 31 17 (!) 33 (!) 24  Temp:    98.5 F (36.9 C)  TempSrc:    Oral  SpO2: 95% 92% 91% 94%  Weight:      Height:       General: 31 y.o. female resting in bed in NAD Eyes: PERRL, normal sclera ENMT: Nares patent w/o discharge, orophaynx clear, dentition normal, ears w/o discharge/lesions/ulcers Neck: Supple, trachea midline Cardiovascular: tachy, +S1, S2, no m/g/r, equal pulses throughout Respiratory: decreased at bases, tachy, no w/r/r, somewhat increased WOB WOB GI: BS+, NDNT, no masses noted, no organomegaly noted MSK: No e/c/c Neuro: A&O x 3, no focal deficits Psyc: Appropriate interaction and affect, calm/cooperative  Data Reviewed:  K+ 2.9 CO2 19 D-dimer 0.52  CTA chest: Mild multifocal pneumonia. Atypical/viral etiologies including COVID are possible.  Assessment and Plan: No notes have been filed under this hospital service. Service: Hospitalist Multifocal PNA secondary to Influenza A infection Asthma exacerbation secondary to Influenza A infection     - admit to inpt,  tele     - wean O2 as able     - nebs, tamiflu, guaifenesin  Hypokalemia     - check Mg2+, replace K+  Tobacco abuse     - counsel against further use     - nicotine patch  Morbid obesity     - counsel on lifestyle and diet changes; follow up outpt  Advance Care Planning:   Code Status: FULL  Consults: None  Family Communication: w/ family at bedside  Severity of Illness: The appropriate patient status for this patient is OBSERVATION. Observation status is judged to be reasonable and  necessary in order to provide the required intensity of service to ensure the patient's safety. The patient's presenting symptoms, physical exam findings, and initial radiographic and laboratory data in the context of their medical condition is felt to place them at decreased risk for further clinical deterioration. Furthermore, it is anticipated that the patient will be medically stable for discharge from the hospital within 2 midnights of admission.   Author: Teddy Spike, DO 12/10/2021 8:01 AM  For on call review [email or web address].

## 2021-12-10 NOTE — ED Notes (Signed)
Pt coming out of room while this RN in another pt room asking to leave. Once out of room, this RN went to speak with patient. Pt agitated stating she is "tired of waiting in the ER all day, she deserves a room." Pt states "I'd rather just die at home than be here suffering." Pt informed she was getting the same care in the ER that she would get upstairs. Pt also informed of risks of leaving and AMA. Pt states she understands risks. This RN asked pt to wait to at least speak with hospitalist before leaving, pt refused. Pt states she has home neb treatments she can use. Pt reassured that she may come back to ER if need be. Pt states understanding. Pt witnessed leaving ER against medical advice.

## 2021-12-11 ENCOUNTER — Inpatient Hospital Stay (HOSPITAL_COMMUNITY)
Admission: EM | Admit: 2021-12-11 | Discharge: 2021-12-14 | DRG: 194 | Disposition: A | Discharge status: Home / Self Care | Payer: Medicaid Other | Attending: Internal Medicine | Admitting: Internal Medicine

## 2021-12-11 ENCOUNTER — Other Ambulatory Visit: Payer: Self-pay

## 2021-12-11 ENCOUNTER — Encounter (HOSPITAL_COMMUNITY): Payer: Self-pay | Admitting: Emergency Medicine

## 2021-12-11 DIAGNOSIS — J1100 Influenza due to unidentified influenza virus with unspecified type of pneumonia: Secondary | ICD-10-CM | POA: Diagnosis present

## 2021-12-11 DIAGNOSIS — J4551 Severe persistent asthma with (acute) exacerbation: Principal | ICD-10-CM | POA: Diagnosis present

## 2021-12-11 DIAGNOSIS — Z87442 Personal history of urinary calculi: Secondary | ICD-10-CM

## 2021-12-11 DIAGNOSIS — J1000 Influenza due to other identified influenza virus with unspecified type of pneumonia: Principal | ICD-10-CM | POA: Diagnosis present

## 2021-12-11 DIAGNOSIS — J45901 Unspecified asthma with (acute) exacerbation: Secondary | ICD-10-CM

## 2021-12-11 DIAGNOSIS — J189 Pneumonia, unspecified organism: Secondary | ICD-10-CM | POA: Diagnosis not present

## 2021-12-11 DIAGNOSIS — Z6841 Body Mass Index (BMI) 40.0 and over, adult: Secondary | ICD-10-CM

## 2021-12-11 DIAGNOSIS — J09X1 Influenza due to identified novel influenza A virus with pneumonia: Secondary | ICD-10-CM | POA: Diagnosis present

## 2021-12-11 DIAGNOSIS — Z880 Allergy status to penicillin: Secondary | ICD-10-CM | POA: Diagnosis not present

## 2021-12-11 DIAGNOSIS — E876 Hypokalemia: Secondary | ICD-10-CM | POA: Diagnosis present

## 2021-12-11 DIAGNOSIS — D696 Thrombocytopenia, unspecified: Secondary | ICD-10-CM | POA: Diagnosis present

## 2021-12-11 DIAGNOSIS — Z87891 Personal history of nicotine dependence: Secondary | ICD-10-CM

## 2021-12-11 DIAGNOSIS — J188 Other pneumonia, unspecified organism: Secondary | ICD-10-CM | POA: Diagnosis present

## 2021-12-11 DIAGNOSIS — Z825 Family history of asthma and other chronic lower respiratory diseases: Secondary | ICD-10-CM

## 2021-12-11 DIAGNOSIS — Z20822 Contact with and (suspected) exposure to covid-19: Secondary | ICD-10-CM | POA: Diagnosis present

## 2021-12-11 DIAGNOSIS — J441 Chronic obstructive pulmonary disease with (acute) exacerbation: Secondary | ICD-10-CM | POA: Diagnosis present

## 2021-12-11 LAB — TSH: TSH: 0.533 u[IU]/mL (ref 0.350–4.500)

## 2021-12-11 LAB — STREP PNEUMONIAE URINARY ANTIGEN: Strep Pneumo Urinary Antigen: NEGATIVE

## 2021-12-11 LAB — CBC WITH DIFFERENTIAL/PLATELET
Abs Immature Granulocytes: 0.02 10*3/uL (ref 0.00–0.07)
Basophils Absolute: 0 10*3/uL (ref 0.0–0.1)
Basophils Relative: 0 %
Eosinophils Absolute: 0 10*3/uL (ref 0.0–0.5)
Eosinophils Relative: 0 %
HCT: 37.2 % (ref 36.0–46.0)
Hemoglobin: 12.3 g/dL (ref 12.0–15.0)
Immature Granulocytes: 0 %
Lymphocytes Relative: 10 %
Lymphs Abs: 0.7 10*3/uL (ref 0.7–4.0)
MCH: 29.6 pg (ref 26.0–34.0)
MCHC: 33.1 g/dL (ref 30.0–36.0)
MCV: 89.4 fL (ref 80.0–100.0)
Monocytes Absolute: 0.3 10*3/uL (ref 0.1–1.0)
Monocytes Relative: 4 %
Neutro Abs: 5.7 10*3/uL (ref 1.7–7.7)
Neutrophils Relative %: 86 %
Platelets: 142 10*3/uL — ABNORMAL LOW (ref 150–400)
RBC: 4.16 MIL/uL (ref 3.87–5.11)
RDW: 12.9 % (ref 11.5–15.5)
WBC: 6.7 10*3/uL (ref 4.0–10.5)
nRBC: 0 % (ref 0.0–0.2)

## 2021-12-11 LAB — COMPREHENSIVE METABOLIC PANEL
ALT: 17 U/L (ref 0–44)
AST: 22 U/L (ref 15–41)
Albumin: 3.5 g/dL (ref 3.5–5.0)
Alkaline Phosphatase: 46 U/L (ref 38–126)
Anion gap: 12 (ref 5–15)
BUN: 15 mg/dL (ref 6–20)
CO2: 19 mmol/L — ABNORMAL LOW (ref 22–32)
Calcium: 8 mg/dL — ABNORMAL LOW (ref 8.9–10.3)
Chloride: 106 mmol/L (ref 98–111)
Creatinine, Ser: 0.79 mg/dL (ref 0.44–1.00)
GFR, Estimated: >60 mL/min (ref >60)
Glucose, Bld: 132 mg/dL — ABNORMAL HIGH (ref 70–99)
Potassium: 2.9 mmol/L — ABNORMAL LOW (ref 3.5–5.1)
Sodium: 137 mmol/L (ref 135–145)
Total Bilirubin: 0.5 mg/dL (ref 0.3–1.2)
Total Protein: 6.7 g/dL (ref 6.5–8.1)

## 2021-12-11 LAB — TROPONIN I (HIGH SENSITIVITY)
Troponin I (High Sensitivity): 5 ng/L (ref <18)
Troponin I (High Sensitivity): 5 ng/L (ref <18)

## 2021-12-11 LAB — HIV ANTIBODY (ROUTINE TESTING W REFLEX): HIV Screen 4th Generation wRfx: NONREACTIVE

## 2021-12-11 MED ORDER — LEVALBUTEROL HCL 0.63 MG/3ML IN NEBU
0.6300 mg | INHALATION_SOLUTION | Freq: Four times a day (QID) | RESPIRATORY_TRACT | Status: DC
  Administered 2021-12-11 – 2021-12-14 (×11): 0.63 mg via RESPIRATORY_TRACT
  Filled 2021-12-11 (×14): qty 3

## 2021-12-11 MED ORDER — ENOXAPARIN SODIUM 60 MG/0.6ML IJ SOSY
50.0000 mg | PREFILLED_SYRINGE | INTRAMUSCULAR | Status: DC
  Administered 2021-12-12 – 2021-12-13 (×2): 50 mg via SUBCUTANEOUS
  Filled 2021-12-11: qty 0.5
  Filled 2021-12-11 (×3): qty 0.6

## 2021-12-11 MED ORDER — ACETAMINOPHEN 325 MG PO TABS
650.0000 mg | ORAL_TABLET | Freq: Four times a day (QID) | ORAL | Status: DC | PRN
Start: 2021-12-11 — End: 2021-12-14
  Administered 2021-12-11 – 2021-12-14 (×2): 650 mg via ORAL
  Filled 2021-12-11 (×4): qty 2

## 2021-12-11 MED ORDER — ALBUTEROL SULFATE (2.5 MG/3ML) 0.083% IN NEBU
10.0000 mg/h | INHALATION_SOLUTION | RESPIRATORY_TRACT | Status: DC
  Administered 2021-12-11: 10 mg/h via RESPIRATORY_TRACT
  Filled 2021-12-11 (×2): qty 12

## 2021-12-11 MED ORDER — POTASSIUM CHLORIDE CRYS ER 20 MEQ PO TBCR
40.0000 meq | EXTENDED_RELEASE_TABLET | ORAL | Status: AC
  Administered 2021-12-11 (×2): 40 meq via ORAL
  Filled 2021-12-11 (×2): qty 2

## 2021-12-11 MED ORDER — OSELTAMIVIR PHOSPHATE 75 MG PO CAPS
75.0000 mg | ORAL_CAPSULE | Freq: Two times a day (BID) | ORAL | Status: DC
  Administered 2021-12-11 – 2021-12-14 (×7): 75 mg via ORAL
  Filled 2021-12-11 (×7): qty 1

## 2021-12-11 MED ORDER — IBUPROFEN 400 MG PO TABS
400.0000 mg | ORAL_TABLET | ORAL | Status: DC | PRN
  Administered 2021-12-11 – 2021-12-12 (×3): 400 mg via ORAL
  Filled 2021-12-11 (×4): qty 1

## 2021-12-11 MED ORDER — SODIUM CHLORIDE 0.9 % IV SOLN
500.0000 mg | INTRAVENOUS | Status: DC
  Administered 2021-12-11 – 2021-12-12 (×2): 500 mg via INTRAVENOUS
  Filled 2021-12-11 (×2): qty 5

## 2021-12-11 MED ORDER — OXYCODONE HCL 5 MG PO TABS
5.0000 mg | ORAL_TABLET | Freq: Once | ORAL | Status: AC
  Administered 2021-12-11: 5 mg via ORAL
  Filled 2021-12-11: qty 1

## 2021-12-11 MED ORDER — BENZONATATE 100 MG PO CAPS
100.0000 mg | ORAL_CAPSULE | Freq: Three times a day (TID) | ORAL | Status: DC
  Administered 2021-12-11 – 2021-12-14 (×10): 100 mg via ORAL
  Filled 2021-12-11 (×10): qty 1

## 2021-12-11 MED ORDER — SODIUM CHLORIDE 0.45 % IV SOLN: INTRAVENOUS | Status: DC

## 2021-12-11 MED ORDER — LEVALBUTEROL HCL 1.25 MG/0.5ML IN NEBU
INHALATION_SOLUTION | RESPIRATORY_TRACT | Status: AC
  Filled 2021-12-11: qty 0.5

## 2021-12-11 MED ORDER — METHYLPREDNISOLONE SODIUM SUCC 40 MG IJ SOLR
40.0000 mg | Freq: Two times a day (BID) | INTRAMUSCULAR | Status: DC
Start: 2021-12-11 — End: 2021-12-13
  Administered 2021-12-11 – 2021-12-13 (×4): 40 mg via INTRAVENOUS
  Filled 2021-12-11 (×4): qty 1

## 2021-12-11 MED ORDER — SODIUM CHLORIDE 0.9 % IV BOLUS
1000.0000 mL | Freq: Once | INTRAVENOUS | Status: AC
  Administered 2021-12-11: 1000 mL via INTRAVENOUS

## 2021-12-11 MED ORDER — SODIUM CHLORIDE 0.9 % IV SOLN
2.0000 g | INTRAVENOUS | Status: DC
  Administered 2021-12-11 – 2021-12-13 (×3): 2 g via INTRAVENOUS
  Filled 2021-12-11 (×3): qty 20

## 2021-12-11 MED ORDER — LEVALBUTEROL HCL 0.63 MG/3ML IN NEBU
0.6300 mg | INHALATION_SOLUTION | Freq: Four times a day (QID) | RESPIRATORY_TRACT | Status: DC | PRN
  Filled 2021-12-11: qty 3

## 2021-12-11 MED ORDER — BUDESONIDE 0.25 MG/2ML IN SUSP
0.2500 mg | Freq: Two times a day (BID) | RESPIRATORY_TRACT | Status: DC
Start: 2021-12-11 — End: 2021-12-14
  Administered 2021-12-11 – 2021-12-14 (×7): 0.25 mg via RESPIRATORY_TRACT
  Filled 2021-12-11 (×7): qty 2

## 2021-12-11 NOTE — Assessment & Plan Note (Addendum)
CT angiogram showed multifocal pneumonia.  Influenza was positive.  COVID was negative.  Did not have any oxygen requirements although she was quite tachypneic.  She was started on ceftriaxone azithromycin and Tamiflu.  Troponins were normal.  HIV nonreactive.  TSH is normal. Patient has improved.

## 2021-12-11 NOTE — ED Notes (Signed)
Pt provided coffee  

## 2021-12-11 NOTE — Assessment & Plan Note (Addendum)
Repleted. °

## 2021-12-11 NOTE — Progress Notes (Signed)
PT awake and alert. PT BS tight and dim. Started pt on CAT. Will continue to monitor.

## 2021-12-11 NOTE — ED Triage Notes (Signed)
Pt brought to ED by [facility] via stretcher with c/o ShoB since Tuesday that gradually worsened. EMS reports that pt was seen at [facility] and signed out Alpha d/t wait times. Pt attempted home neb tx with no relief in symptoms. EMS reports O2 saturations in low 80s upon arrival to residence. Pt on 4L O2 via [place] at time of arrival to ED. Albuterol 10mg  neb, Atrovent neb tx x1,  solumedrol 125mg  IVP, AND  529ml  NS bolus administered by EMS PTA.

## 2021-12-11 NOTE — ED Notes (Signed)
Up to b/r, steady gait, alert, NAD, calm, interactive, resps e/u, speaking in clear complete sentences, VSS. Denies nausea or sob while at rest. Endorses CP.

## 2021-12-11 NOTE — H&P (Signed)
History and Physical    Mercedes [name] SWH:[id number] DOB: 05/09/1991 DOA: 12/11/2021  PCP: Patient, No Pcp Per (Inactive)  Patient coming from: Home.  Chief Complaint: Shortness of breath.  HPI: Mercedes [name] is a 31 y.o. female with history of asthma and tobacco abuse presents to the ER for the second time with complaints of persistent shortness of breath.  Patient's symptoms started about 4 days ago.  Patient states her fianc also was sick last week.  Patient progressively got more short of breath over the last 4 days with some chest pressure.  Has been having some fever chills.  Denies any productive cough.  ED Course: In the ER yesterday patient was diagnosed with multifocal pneumonia seen on the CT angiogram of the chest with influenza A+.  COVID was negative.  Patient also was found to be wheezing was started on empiric therapy for flu and asthma exacerbation but patient signed out AMA.  Patient comes back again because of persistent shortness of breath.  On exam patient has bilateral expiratory wheeze and is also tachycardic.  Review of Systems: As per HPI, rest all negative.   Past Medical History:  Diagnosis Date   Anemia    Asthma    Hypokalemia    Kidney stone 2018    Past Surgical History:  Procedure Laterality Date   NO PAST SURGERIES     WISDOM TOOTH EXTRACTION       reports that she quit smoking about 2 years ago. Her smoking use included cigarettes. She has never used smokeless tobacco. She reports that she does not currently use drugs after having used the following drugs: Marijuana. She reports that she does not drink alcohol.  Allergies  Allergen Reactions   Penicillins Other (See Comments)    Reaction:  Unknown  Has patient had a PCN reaction causing immediate rash, facial/tongue/throat swelling, SOB or lightheadedness with hypotension: Unknown Has patient had a PCN reaction causing severe rash involving mucus membranes or skin necrosis:  Unknown Has patient had a PCN reaction that required hospitalization: Unknown Has patient had a PCN reaction occurring within the last 10 years: No If all of the above answers are "NO", then may proceed with Cephalosporin use. Unknown reaction Childhood allergy    Family History  Problem Relation Age of Onset   Cancer Mother        ovarian   Asthma Mother     Prior to Admission medications   Not on File    Physical Exam: Constitutional: Moderately built and nourished. Vitals:   12/11/21 0419 12/11/21 0420 12/11/21 0422  BP:   134/74  Pulse:  98 98  Resp:  (!) 26 (!) 28  Temp:   99.3 F (37.4 C)  TempSrc:   Oral  SpO2:  100% 99%  Weight: 106.6 kg    Height: 5\' 4"  (1.626 m)     Eyes: Anicteric no pallor. ENMT: No discharge from the ears eyes nose and mouth. Neck: No mass felt.  No neck rigidity. Respiratory: Bilateral expiratory wheeze and no crepitations. Cardiovascular: S1-S2 heard. Abdomen: Soft nontender bowel sound present. Musculoskeletal: No edema. Skin: No rash. Neurologic: Alert awake oriented time place and person.  Moves all extremities. Psychiatric: Appears normal.  Normal affect.   Labs on Admission: I have personally reviewed following labs and imaging studies  CBC: Recent Labs  Lab 12/09/21 2230 12/10/21 0929  WBC 6.0 3.1*  NEUTROABS  --  2.4  HGB 14.5 13.4  HCT 42.7 39.0  MCV 87.7 88.0  PLT 125* 147*   Basic Metabolic Panel: Recent Labs  Lab 12/09/21 2230 12/10/21 0929  NA 135 134*  K 2.9* 3.8  CL 104 105  CO2 19* 22  GLUCOSE 116* 145*  BUN 19 15  CREATININE 0.76 0.63  CALCIUM 9.0 9.1  MG  --  2.6*   GFR: Estimated Creatinine Clearance: 123.7 mL/min (by C-G formula based on SCr of 0.63 mg/dL). Liver Function Tests: Recent Labs  Lab 12/09/21 2230 12/10/21 0929  AST 26 25  ALT 20 19  ALKPHOS 57 50  BILITOT 0.4 0.4  PROT 8.4* 8.1  ALBUMIN 4.1 3.9   Recent Labs  Lab 12/09/21 2230  LIPASE 28   No results for  input(s): AMMONIA in the last 168 hours. Coagulation Profile: No results for input(s): INR, PROTIME in the last 168 hours. Cardiac Enzymes: No results for input(s): CKTOTAL, CKMB, CKMBINDEX, TROPONINI in the last 168 hours. BNP (last 3 results) No results for input(s): PROBNP in the last 8760 hours. HbA1C: No results for input(s): HGBA1C in the last 72 hours. CBG: No results for input(s): GLUCAP in the last 168 hours. Lipid Profile: No results for input(s): CHOL, HDL, LDLCALC, TRIG, CHOLHDL, LDLDIRECT in the last 72 hours. Thyroid Function Tests: No results for input(s): TSH, T4TOTAL, FREET4, T3FREE, THYROIDAB in the last 72 hours. Anemia Panel: No results for input(s): VITAMINB12, FOLATE, FERRITIN, TIBC, IRON, RETICCTPCT in the last 72 hours. Urine analysis:    Component Value Date/Time   COLORURINE AMBER (A) 08/11/2021 2042   APPEARANCEUR CLOUDY (A) 08/11/2021 2042   LABSPEC 1.032 (H) 08/11/2021 2042   PHURINE 5.0 08/11/2021 2042   GLUCOSEU NEGATIVE 08/11/2021 2042   HGBUR MODERATE (A) 08/11/2021 2042   BILIRUBINUR NEGATIVE 08/11/2021 2042   KETONESUR 80 (A) 08/11/2021 2042   PROTEINUR 100 (A) 08/11/2021 2042   UROBILINOGEN 0.2 11/23/2017 1541   NITRITE NEGATIVE 08/11/2021 2042   LEUKOCYTESUR MODERATE (A) 08/11/2021 2042   Sepsis Labs: @LABRCNTIP (procalcitonin:4,lacticidven:4) ) Recent Results (from the past 240 hour(s))  Resp Panel by RT-PCR (Flu A&B, Covid) Nasopharyngeal Swab     Status: Abnormal   Collection Time: 12/09/21 11:48 PM   Specimen: Nasopharyngeal Swab; Nasopharyngeal(NP) swabs in vial transport medium  Result Value Ref Range Status   SARS Coronavirus 2 by RT PCR NEGATIVE NEGATIVE Final    Comment: (NOTE) SARS-CoV-2 target nucleic acids are NOT DETECTED.  The SARS-CoV-2 RNA is generally detectable in upper respiratory specimens during the acute phase of infection. The lowest concentration of SARS-CoV-2 viral copies this assay can detect is 138  copies/mL. A negative result does not preclude SARS-Cov-2 infection and should not be used as the sole basis for treatment or other patient management decisions. A negative result may occur with  improper specimen collection/handling, submission of specimen other than nasopharyngeal swab, presence of viral mutation(s) within the areas targeted by this assay, and inadequate number of viral copies(<138 copies/mL). A negative result must be combined with clinical observations, patient history, and epidemiological information. The expected result is Negative.  Fact Sheet for Patients:  12/11/21  Fact Sheet for Healthcare Providers:  BloggerCourse.com  This test is no t yet approved or cleared by the [place] FDA and  has been authorized for detection and/or diagnosis of SARS-CoV-2 by FDA under an Emergency Use Authorization (EUA). This EUA will remain  in effect (meaning this test can be used) for the duration of the COVID-19 declaration under Section 564(b)(1) of the Act, 21 U.S.C.section 360bbb-3(b)(1),  unless the authorization is terminated  or revoked sooner.       Influenza A by PCR POSITIVE (A) NEGATIVE Final   Influenza B by PCR NEGATIVE NEGATIVE Final    Comment: (NOTE) The Xpert Xpress SARS-CoV-2/FLU/RSV plus assay is intended as an aid in the diagnosis of influenza from Nasopharyngeal swab specimens and should not be used as a sole basis for treatment. Nasal washings and aspirates are unacceptable for Xpert Xpress SARS-CoV-2/FLU/RSV testing.  Fact Sheet for Patients: BloggerCourse.com  Fact Sheet for Healthcare Providers: SeriousBroker.it  This test is not yet approved or cleared by the [place] FDA and has been authorized for detection and/or diagnosis of SARS-CoV-2 by FDA under an Emergency Use Authorization (EUA). This EUA will remain in effect  (meaning this test can be used) for the duration of the COVID-19 declaration under Section 564(b)(1) of the Act, 21 U.S.C. section 360bbb-3(b)(1), unless the authorization is terminated or revoked.  Performed at [facility], [address] [place], [place] [address]      Radiological Exams on Admission: CT Angio Chest PE W/Cm &/Or Wo Cm  Result Date: 12/10/2021 CLINICAL DATA:  Shortness of breath, flu-like symptoms EXAM: CT ANGIOGRAPHY CHEST WITH CONTRAST TECHNIQUE: Multidetector CT imaging of the chest was performed using the standard protocol during bolus administration of intravenous contrast. Multiplanar CT image reconstructions and MIPs were obtained to evaluate the vascular anatomy. RADIATION DOSE REDUCTION: This exam was performed according to the departmental dose-optimization program which includes automated exposure control, adjustment of the mA and/or kV according to patient size and/or use of iterative reconstruction technique. CONTRAST:  OMNIPAQUE IOHEXOL 350 MG/ML SOLN COMPARISON:  Chest radiograph dated 12/09/2021 FINDINGS: Cardiovascular: Satisfactory opacification the bilateral pulmonary arteries to the lobar level. No evidence of pulmonary embolism. Although not tailored for evaluation of the thoracic aorta, there is no evidence of thoracic aortic aneurysm or dissection. The heart is normal in size.  No pericardial effusion. Mediastinum/Nodes: No suspicious mediastinal lymphadenopathy. Visualized thyroid is unremarkable. Lungs/Pleura: Faint patchy/ground-glass opacities in the lungs bilaterally, lower lobe predominant, suggesting mild infection/pneumonia. Atypical/viral etiologies including COVID are possible. No suspicious pulmonary nodules. No pleural effusion or pneumothorax. Upper Abdomen: Visualized upper abdomen is grossly unremarkable. Musculoskeletal: Visualized osseous structures are within normal limits. Review of the MIP images confirms the above  findings. IMPRESSION: No evidence of pulmonary embolism. Mild multifocal pneumonia. Atypical/viral etiologies including COVID are possible. Electronically Signed   By: Charline Bills M.D.   On: 12/10/2021 03:53   DG Chest Portable 1 View  Result Date: 12/09/2021 CLINICAL DATA:  Dyspnea, history of asthma. EXAM: PORTABLE CHEST 1 VIEW COMPARISON:  02/27/2016 FINDINGS: The heart size and mediastinal contours are within normal limits. Both lungs are clear. No acute osseous abnormality. IMPRESSION: No active disease. Electronically Signed   By: Thornell Sartorius M.D.   On: 12/09/2021 22:59    EKG: Independently reviewed.  Sinus tachycardia.  Assessment/Plan Principal Problem:   Multifocal pneumonia Active Problems:   Asthma exacerbation   Influenza A with pneumonia    Multifocal pneumonia with influenza A+ COVID test negative we will keep patient on Tamiflu in addition also we will keep patient on antibiotics for community-acquired pneumonia.  Closely follow respiratory status. Asthma exacerbation for which patient is on steroids nebulizer. Tobacco abuse advised about quitting. Thrombocytopenia and leukopenia appears to be new could be related to patient's pneumonia.  Closely monitor for any further worsening.  Repeat labs including metabolic panel CBC TSH and troponins are  pending.   Since patient has multifocal pneumonia with influenza A+ with asthma exacerbation will need close monitoring for any further worsening and inpatient status.  DVT prophylaxis: Lovenox. Code Status: Full code. Family Communication: Discussed with patient. Disposition Plan: Home. Consults called: None. Admission status: Inpatient.   Eduard ClosArshad N Ashlin Kreps MD [facility] Pager [phone].  If 7PM-7AM, please contact night-coverage [email or web address] Password [email or web address]  12/11/2021, 5:56 AM

## 2021-12-11 NOTE — ED Notes (Signed)
Provided pt with sandwich. 

## 2021-12-11 NOTE — Progress Notes (Signed)
[facility] PROGRESS NOTE   De Mink [id number] DOB: 15-Mar-1991 DOA: 12/11/2021  0 DOS: the Mercedes [name] was seen and examined on 12/11/2021  PCP: Mercedes [name], No Pcp Per (Inactive)  Brief History and Hospital Course:  31 y.o. female with history of asthma and tobacco abuse presented to the ER for the second time with complaints of persistent shortness of breath.  Mercedes [name]'s symptoms started about 4 days prior to admission.  Mercedes [name] states her fianc also was sick.  Mercedes [name] progressively got more short of breath with some chest pressure.  Has been having some fever chills.  Denies any productive cough.  Found to have influenza.  CT angiogram did not show any PE but did show pneumonia.  She left AGAINST MEDICAL ADVICE but then came back because of worsening symptoms and was subsequently hospitalized.  Consultants: None  Procedures: None    Subjective: Mercedes [name] mentions that she is not feeling any better.  Denies any worsening symptoms either.  Some chest discomfort is present.  Denies any dizziness lightheadedness.  Dry cough.  Wheezing is present.  Remains short of breath.    Assessment/Plan:  * Influenza A with pneumonia- (present on admission) CT angiogram showed multifocal pneumonia.  Influenza was positive.  COVID was negative.  Does not have any oxygen requirements currently.  Still quite tachycardic.  Continue with ceftriaxone, azithromycin and Tamiflu.  WBC noted to be normal this morning.  Troponins are normal.  HIV nonreactive.  TSH is normal. Could be hypovolemic from recent acute illness.  Will initiate IV fluids.  Monitor heart rate closely on telemetry.  Asthma exacerbation- (present on admission) Scattered wheezing appreciated.  Likely triggered by influenza.  Remains on nebulizer treatments and steroids.  Does not follow with any provider for her asthma.  Uses as needed inhalers at home.  Not on any maintenance drugs.  Hypokalemia- (present on  admission) Will replete.  Magnesium was 2.6 yesterday.  Thrombocytopenia ([place])- (present on admission) Likely triggered by viral infection.  Counts are stable.   Obesity Estimated body mass index is 40.34 kg/m as calculated from the following:   Height as of this encounter: 5\' 4"  (1.626 m).   Weight as of this encounter: 106.6 kg.    DVT Prophylaxis: Lovenox Code Status: Full code Family Communication: Discussed with Mercedes [name] Disposition Plan: Hopefully home in 2 to 3 days  Status is: Inpatient Remains inpatient appropriate because: Multifocal pneumonia, asthma exacerbation       Medications: Scheduled:  budesonide (PULMICORT) nebulizer solution  0.25 mg Nebulization BID   enoxaparin (LOVENOX) injection  50 mg Subcutaneous Q24H   levalbuterol  0.63 mg Nebulization Q6H   methylPREDNISolone (SOLU-MEDROL) injection  40 mg Intravenous Q12H   oseltamivir  75 mg Oral BID   potassium chloride  40 mEq Oral Q4H   Continuous:  sodium chloride     azithromycin Stopped (12/11/21 0845)   cefTRIAXone (ROCEPHIN)  IV Stopped (12/11/21 0700)   [id number], ibuprofen, levalbuterol  Antibiotics: Anti-infectives (From admission, onward)    Start     Dose/Rate Route Frequency Ordered Stop   12/11/21 1000  oseltamivir (TAMIFLU) capsule 75 mg        75 mg Oral 2 times daily 12/11/21 0556 12/16/21 0959   12/11/21 0600  cefTRIAXone (ROCEPHIN) 2 g in sodium chloride 0.9 % 100 mL IVPB        2 g 200 mL/hr over 30 Minutes Intravenous Every 24 hours 12/11/21 0556 12/16/21 0559   12/11/21 0600  azithromycin (ZITHROMAX) 500  mg in sodium chloride 0.9 % 250 mL IVPB        500 mg 250 mL/hr over 60 Minutes Intravenous Every 24 hours 12/11/21 0556 12/16/21 0559       Objective:  Vital Signs  Vitals:   12/11/21 0700 12/11/21 0730 12/11/21 0800 12/11/21 0900  BP: 138/67 (!) 144/74 101/78 123/82  Pulse: (!) 117 (!) 122 (!) 110 (!) 118  Resp: (!) 34 (!) 28 (!) 30 (!) 23  Temp:       TempSrc:      SpO2: 93% 93% 97% 95%  Weight:      Height:        Intake/Output Summary (Last 24 hours) at 12/11/2021 0918 Last data filed at 12/11/2021 0848 Gross per 24 hour  Intake 344.67 ml  Output --  Net 344.67 ml   Filed Weights   12/11/21 0419  Weight: 106.6 kg    General appearance: Awake alert.  In no distress Resp: Mildly tachypneic without use of accessory muscles.  Crackles bilateral bases.  Scattered wheezing.  Occasional rhonchi. Cardio: S1-S2 is tachycardic regular.  No S3-S4.  No rubs or bruit. GI: Abdomen is soft.  Nontender nondistended.  Bowel sounds are present normal.  No masses organomegaly Extremities: No edema.  Full range of motion of lower extremities. Neurologic: Alert and oriented x3.  No focal neurological deficits.    Lab Results:  Data Reviewed: I have personally reviewed labs and imaging study reports  CBC: Recent Labs  Lab 12/09/21 2230 12/10/21 0929 12/11/21 0625  WBC 6.0 3.1* 6.7  NEUTROABS  --  2.4 5.7  HGB 14.5 13.4 12.3  HCT 42.7 39.0 37.2  MCV 87.7 88.0 89.4  PLT 125* 147* 142*    Basic Metabolic Panel: Recent Labs  Lab 12/09/21 2230 12/10/21 0929 12/11/21 0625  NA 135 134* 137  K 2.9* 3.8 2.9*  CL 104 105 106  CO2 19* 22 19*  GLUCOSE 116* 145* 132*  BUN 19 15 15   CREATININE 0.76 0.63 0.79  CALCIUM 9.0 9.1 8.0*  MG  --  2.6*  --     GFR: Estimated Creatinine Clearance: 123.7 mL/min (by C-G formula based on SCr of 0.79 mg/dL).  Liver Function Tests: Recent Labs  Lab 12/09/21 2230 12/10/21 0929 12/11/21 0625  AST 26 25 22   ALT 20 19 17   ALKPHOS 57 50 46  BILITOT 0.4 0.4 0.5  PROT 8.4* 8.1 6.7  ALBUMIN 4.1 3.9 3.5    Recent Labs  Lab 12/09/21 2230  LIPASE 28    Thyroid Function Tests: Recent Labs    12/11/21 0625  TSH 0.533     Recent Results (from the past 240 hour(s))  Resp Panel by RT-PCR (Flu A&B, Covid) Nasopharyngeal Swab     Status: Abnormal   Collection Time: 12/09/21 11:48 PM    Specimen: Nasopharyngeal Swab; Nasopharyngeal(NP) swabs in vial transport medium  Result Value Ref Range Status   SARS Coronavirus 2 by RT PCR NEGATIVE NEGATIVE Final    Comment: (NOTE) SARS-CoV-2 target nucleic acids are NOT DETECTED.  The SARS-CoV-2 RNA is generally detectable in upper respiratory specimens during the acute phase of infection. The lowest concentration of SARS-CoV-2 viral copies this assay can detect is 138 copies/mL. A negative result does not preclude SARS-Cov-2 infection and should not be used as the sole basis for treatment or other Mercedes [name] management decisions. A negative result may occur with  improper specimen collection/handling, submission of specimen other than nasopharyngeal swab, presence of  viral mutation(s) within the areas targeted by this assay, and inadequate number of viral copies(<138 copies/mL). A negative result must be combined with clinical observations, Mercedes [name] history, and epidemiological information. The expected result is Negative.  Fact Sheet for Patients:  EntrepreneurPulse.com.au  Fact Sheet for Healthcare Providers:  IncredibleEmployment.be  This test is no t yet approved or cleared by the [place] FDA and  has been authorized for detection and/or diagnosis of SARS-CoV-2 by FDA under an Emergency Use Authorization (EUA). This EUA will remain  in effect (meaning this test can be used) for the duration of the COVID-19 declaration under Section 564(b)(1) of the Act, 21 U.S.C.section 360bbb-3(b)(1), unless the authorization is terminated  or revoked sooner.       Influenza A by PCR POSITIVE (A) NEGATIVE Final   Influenza B by PCR NEGATIVE NEGATIVE Final    Comment: (NOTE) The Xpert Xpress SARS-CoV-2/FLU/RSV plus assay is intended as an aid in the diagnosis of influenza from Nasopharyngeal swab specimens and should not be used as a sole basis for treatment. Nasal washings and aspirates are  unacceptable for Xpert Xpress SARS-CoV-2/FLU/RSV testing.  Fact Sheet for Patients: EntrepreneurPulse.com.au  Fact Sheet for Healthcare Providers: IncredibleEmployment.be  This test is not yet approved or cleared by the [place] FDA and has been authorized for detection and/or diagnosis of SARS-CoV-2 by FDA under an Emergency Use Authorization (EUA). This EUA will remain in effect (meaning this test can be used) for the duration of the COVID-19 declaration under Section 564(b)(1) of the Act, 21 U.S.C. section 360bbb-3(b)(1), unless the authorization is terminated or revoked.  Performed at [facility], [place] [address], [place],  [address]       Radiology Studies: CT Angio Chest PE W/Cm &/Or Wo Cm  Result Date: 12/10/2021 CLINICAL DATA:  Shortness of breath, flu-like symptoms EXAM: CT ANGIOGRAPHY CHEST WITH CONTRAST TECHNIQUE: Multidetector CT imaging of the chest was performed using the standard protocol during bolus administration of intravenous contrast. Multiplanar CT image reconstructions and MIPs were obtained to evaluate the vascular anatomy. RADIATION DOSE REDUCTION: This exam was performed according to the departmental dose-optimization program which includes automated exposure control, adjustment of the mA and/or kV according to Mercedes [name] size and/or use of iterative reconstruction technique. CONTRAST:  164mL OMNIPAQUE IOHEXOL 350 MG/ML SOLN COMPARISON:  Chest radiograph dated 12/09/2021 FINDINGS: Cardiovascular: Satisfactory opacification the bilateral pulmonary arteries to the lobar level. No evidence of pulmonary embolism. Although not tailored for evaluation of the thoracic aorta, there is no evidence of thoracic aortic aneurysm or dissection. The heart is normal in size.  No pericardial effusion. Mediastinum/Nodes: No suspicious mediastinal lymphadenopathy. Visualized thyroid is unremarkable. Lungs/Pleura: Faint  patchy/ground-glass opacities in the lungs bilaterally, lower lobe predominant, suggesting mild infection/pneumonia. Atypical/viral etiologies including COVID are possible. No suspicious pulmonary nodules. No pleural effusion or pneumothorax. Upper Abdomen: Visualized upper abdomen is grossly unremarkable. Musculoskeletal: Visualized osseous structures are within normal limits. Review of the MIP images confirms the above findings. IMPRESSION: No evidence of pulmonary embolism. Mild multifocal pneumonia. Atypical/viral etiologies including COVID are possible. Electronically Signed   By: Julian Hy M.D.   On: 12/10/2021 03:53   DG Chest Portable 1 View  Result Date: 12/09/2021 CLINICAL DATA:  Dyspnea, history of asthma. EXAM: PORTABLE CHEST 1 VIEW COMPARISON:  02/27/2016 FINDINGS: The heart size and mediastinal contours are within normal limits. Both lungs are clear. No acute osseous abnormality. IMPRESSION: No active disease. Electronically Signed   By: Regan Rakers.D.  On: 12/09/2021 22:59       LOS: 0 days   [facility] Pager on [email or web address]  12/11/2021, 9:18 AM

## 2021-12-11 NOTE — Hospital Course (Addendum)
31 y.o. female with history of asthma and tobacco abuse presented to the ER for the second time with complaints of persistent shortness of breath.  Patient's symptoms started about 4 days prior to admission.  Patient states her fianc also was sick.  Patient progressively got more short of breath with some chest pressure.  Has been having some fever chills.  Denies any productive cough.  Found to have influenza.  CT angiogram did not show any PE but did show pneumonia.  She left AGAINST MEDICAL ADVICE but then came back because of worsening symptoms and was subsequently hospitalized.  Patient was placed on antibiotics steroids nebulizer treatments.  She has improved significantly.

## 2021-12-11 NOTE — Assessment & Plan Note (Addendum)
Now resolved.  Likely triggered by viral infection.  Marland Kitchen

## 2021-12-11 NOTE — Discharge Summary (Signed)
AMA DISCHARGE  HPI: Del Wiseman is a 31 y.o. female with medical history significant of asthma, tobacco abuse. Presenting with dyspnea. She reports that she had 3 days of chills, body aches, and fever. She tried theraflu, but it didn't help. Last night she had an asthma attack. I did not respond to multiple albuterol treatments. She became concerned and decided to come to the ED for help. She has not had her flu shot this year. She denies any sick contacts. She denies any other aggravating or alleviating factors.   The patient was admitted to [facility]. She was started on nebs, tamiflu, guaifenesin and steroids.  Notified by nursing around 1810hrs that patient had left AMA. Apparently she was unwilling to wait in the ED any longer for a floor bed.   Final Dx Multifocal PNA secondary to Influenza A infection Asthma exacerbation secondary to Influenza A infection Hypokalemia Tobacco abuse Morbid obesity  Teddy Spike, DO

## 2021-12-11 NOTE — ED Notes (Signed)
Pt ambulated to restroom and back without becoming short of breath.

## 2021-12-11 NOTE — ED Notes (Signed)
Angelique Blonder, RN [phone] updated, pending room clean.

## 2021-12-11 NOTE — Plan of Care (Signed)
  Problem: Education: Goal: Knowledge of General Education information will improve Description Including pain rating scale, medication(s)/side effects and non-pharmacologic comfort measures Outcome: Progressing   

## 2021-12-11 NOTE — ED Provider Notes (Signed)
[facility] EMERGENCY DEPARTMENT Provider Note  CSN: [id number] Arrival date & time: 12/11/21 0356  Chief Complaint(s) No chief complaint on file.  HPI Mercedes [name] is a 31 y.o. female with a past medical history listed below including asthma who is [place] yesterday for shortness of breath and cough and diagnosed with influenza pneumonia and asthma exacerbation.  She was admitted to the hospitalist service but had a prolonged stay in the emergency department and left [place].  She returns tonight for worsening shortness of breath not relieved by her home breathing treatments.  She continues to cough.  No nausea or vomiting.  No abdominal pain.  No chest pain  Patient brought in by EMS who noted patient's saturations were in the 80s on room air.  She was initially placed on nonrebreather and given 2 DuoNebs and Solu-Medrol.  They were able to transition her down to 4 L nasal cannula.  HPI  Past Medical History Past Medical History:  Diagnosis Date   Anemia    Asthma    Hypokalemia    Kidney stone 2018   Patient Active Problem List   Diagnosis Date Noted   Influenza A with pneumonia 12/10/2021   Obesity, Class III, BMI 40-49.9 (morbid obesity) ([place]) 12/10/2021   Tobacco abuse 12/10/2021   SVD (spontaneous vaginal delivery) 08/08/2017   Post-dates pregnancy 08/06/2017   Supervision of high risk pregnancy, antepartum 06/08/2017   Insufficient prenatal care in third trimester 06/08/2017   Substance abuse affecting pregnancy in third trimester, antepartum 06/08/2017   Obesity in pregnancy with antepartum complication 0000000   Obesity (BMI 30-39.9) 06/08/2017   Nausea and vomiting of pregnancy, antepartum 06/08/2017   Asthma exacerbation 02/27/2016   Anemia in pregnancy 02/27/2016   Hypokalemia 02/27/2016   Asthma affecting pregnancy, antepartum 02/27/2016   Home Medication(s) Prior to Admission medications   Not on File                                                                                                                                     Allergies Penicillins  Review of Systems Review of Systems As noted in HPI  Physical Exam Vital Signs  I have reviewed the triage vital signs BP 134/74 (BP Location: Right Arm)    Pulse 98    Temp 99.3 F (37.4 C) (Oral)    Resp (!) 28    Ht 5\' 4"  (1.626 m)    Wt 106.6 kg    LMP 11/24/2021 Comment: negative beta HCG 12/10/21   SpO2 99%    BMI 40.34 kg/m   Physical Exam Vitals reviewed.  Constitutional:      General: She is not in acute distress.    Appearance: She is well-developed. She is not diaphoretic.  HENT:     Head: Normocephalic and atraumatic.     Nose: Nose normal.  Eyes:     General: No scleral  icterus.       Right eye: No discharge.        Left eye: No discharge.     Conjunctiva/sclera: Conjunctivae normal.     Pupils: Pupils are equal, round, and reactive to light.  Cardiovascular:     Rate and Rhythm: Normal rate and regular rhythm.     Heart sounds: No murmur heard.   No friction rub. No gallop.  Pulmonary:     Effort: Tachypnea, prolonged expiration and respiratory distress present.     Breath sounds: No stridor. Examination of the right-upper field reveals wheezing. Examination of the left-upper field reveals wheezing. Examination of the right-middle field reveals wheezing. Examination of the left-middle field reveals wheezing. Examination of the right-lower field reveals wheezing. Examination of the left-lower field reveals wheezing. Wheezing present. No rales.  Abdominal:     General: There is no distension.     Palpations: Abdomen is soft.     Tenderness: There is no abdominal tenderness.  Musculoskeletal:        General: No tenderness.     Cervical back: Normal range of motion and neck supple.  Skin:    General: Skin is warm and dry.     Findings: No erythema or rash.  Neurological:     Mental Status: She is alert and oriented to  person, place, and time.    ED Results and Treatments Labs (all labs ordered are listed, but only abnormal results are displayed) Labs Reviewed - No data to display                                                                                                                       EKG  EKG Interpretation  Date/Time:  Saturday December 11 2021 04:09:53 EST Ventricular Rate:  102 PR Interval:  128 QRS Duration: 95 QT Interval:  362 QTC Calculation: 472 R Axis:   88 Text Interpretation: Sinus tachycardia with irregular rate Confirmed by Addison Lank [phone]) on 12/11/2021 5:19:01 AM       Radiology No results found.  Pertinent labs & imaging results that were available during my care of the patient were reviewed by me and considered in my medical decision making (see MDM for details).  Medications Ordered in ED Medications  albuterol (PROVENTIL) (2.5 MG/3ML) 0.083% nebulizer solution (10 mg/hr Nebulization Other (enter comment in med admin window) 12/11/21 0424)  sodium chloride 0.9 % bolus 1,000 mL (1,000 mLs Intravenous New Bag/Given (Non-Interop) 12/11/21 0430)  Procedures .1-3 Lead EKG Interpretation Performed by: Fatima Blank, MD Authorized by: Fatima Blank, MD     Interpretation: normal     ECG rate:  98   ECG rate assessment: normal     Rhythm: sinus rhythm     Ectopy: none     Conduction: normal   .Critical Care Performed by: Fatima Blank, MD Authorized by: Fatima Blank, MD   Critical care provider statement:    Critical care time (minutes):  45   Critical care time was exclusive of:  Separately billable procedures and treating other patients   Critical care was necessary to treat or prevent imminent or life-threatening deterioration of the following conditions:  Respiratory failure   Critical  care was time spent personally by me on the following activities:  Development of treatment plan with patient or surrogate, discussions with consultants, evaluation of patient's response to treatment, examination of patient, obtaining history from patient or surrogate, review of old charts, re-evaluation of patient's condition, pulse oximetry, ordering and review of radiographic studies, ordering and review of laboratory studies and ordering and performing treatments and interventions   Care discussed with: admitting provider    (including critical care time)  Medical Decision Making / ED Course        Respiratory distress  Likely asthma exacerbation secondary to influenza pneumonia.  Lower suspicion for pulmonary embolism (she had negative CTA yesterday). Considered obtaining labs including CBC, BMP, VBG as well as chest x-ray but patient recently had labs yesterday.  Will defer for now.   Management: Patient given continuous albuterol treatment IV fluids.   Reassessment: Patient's work of breathing has improved Still has diffuse wheezing.  She requires admission for continued management.   Consult hospitalist service. They requested basic labs. Ordered and noted below.   Labs Independently interpreted by me and noted below: No leukocytosis or anemia Mild hypokalemia.  No renal sufficiency.  Final Clinical Impression(s) / ED Diagnoses Final diagnoses:  Severe persistent asthma with exacerbation  Influenzal pneumonia           This chart was dictated using voice recognition software.  Despite best efforts to proofread,  errors can occur which can change the documentation meaning.    Fatima Blank, MD 12/11/21 [phone]

## 2021-12-11 NOTE — Assessment & Plan Note (Addendum)
Likely triggered by influenza.  Placed on steroids nebulizer treatments and antibiotics.  Improving gradually.  Ambulating without difficulty.  Saturations are normal.

## 2021-12-11 NOTE — ED Notes (Signed)
Speaking on phone, mentions pain unchanged, alert, NAD, calm, interactive, resps e/u.

## 2021-12-12 LAB — BASIC METABOLIC PANEL
Anion gap: 8 (ref 5–15)
BUN: 10 mg/dL (ref 6–20)
CO2: 23 mmol/L (ref 22–32)
Calcium: 8.6 mg/dL — ABNORMAL LOW (ref 8.9–10.3)
Chloride: 105 mmol/L (ref 98–111)
Creatinine, Ser: 0.7 mg/dL (ref 0.44–1.00)
GFR, Estimated: >60 mL/min (ref >60)
Glucose, Bld: 117 mg/dL — ABNORMAL HIGH (ref 70–99)
Potassium: 3.5 mmol/L (ref 3.5–5.1)
Sodium: 136 mmol/L (ref 135–145)

## 2021-12-12 LAB — CBC
HCT: 37.6 % (ref 36.0–46.0)
Hemoglobin: 12 g/dL (ref 12.0–15.0)
MCH: 29.3 pg (ref 26.0–34.0)
MCHC: 31.9 g/dL (ref 30.0–36.0)
MCV: 91.7 fL (ref 80.0–100.0)
Platelets: 152 10*3/uL (ref 150–400)
RBC: 4.1 MIL/uL (ref 3.87–5.11)
RDW: 13.1 % (ref 11.5–15.5)
WBC: 6 10*3/uL (ref 4.0–10.5)
nRBC: 0 % (ref 0.0–0.2)

## 2021-12-12 MED ORDER — AZITHROMYCIN 500 MG PO TABS
500.0000 mg | ORAL_TABLET | Freq: Every day | ORAL | Status: DC
  Administered 2021-12-13 – 2021-12-14 (×2): 500 mg via ORAL
  Filled 2021-12-12 (×2): qty 1

## 2021-12-12 MED ORDER — SODIUM CHLORIDE 0.45 % IV SOLN: INTRAVENOUS | Status: DC

## 2021-12-12 MED ORDER — POTASSIUM CHLORIDE CRYS ER 20 MEQ PO TBCR
40.0000 meq | EXTENDED_RELEASE_TABLET | Freq: Once | ORAL | Status: AC
  Administered 2021-12-12: 40 meq via ORAL
  Filled 2021-12-12: qty 2

## 2021-12-12 MED ORDER — ONDANSETRON HCL 4 MG/2ML IJ SOLN
4.0000 mg | Freq: Four times a day (QID) | INTRAMUSCULAR | Status: DC | PRN
Start: 2021-12-12 — End: 2021-12-14
  Administered 2021-12-12 – 2021-12-13 (×2): 4 mg via INTRAVENOUS
  Filled 2021-12-12 (×3): qty 2

## 2021-12-12 MED ORDER — SODIUM CHLORIDE 0.9 % IV SOLN
12.5000 mg | Freq: Once | INTRAVENOUS | Status: AC
  Administered 2021-12-13: 12.5 mg via INTRAVENOUS
  Filled 2021-12-12: qty 12.5

## 2021-12-12 NOTE — Progress Notes (Signed)
[facility] PROGRESS NOTE   Mercedes [name] [id number] DOB: November 09, 1990 DOA: 12/11/2021  1 DOS: the patient was seen and examined on 12/12/2021  PCP: Patient, No Pcp Per (Inactive)  Brief History and Hospital Course:  31 y.o. female with history of asthma and tobacco abuse presented to the ER for the second time with complaints of persistent shortness of breath.  Patient's symptoms started about 4 days prior to admission.  Patient states her fianc also was sick.  Patient progressively got more short of breath with some chest pressure.  Has been having some fever chills.  Denies any productive cough.  Found to have influenza.  CT angiogram did not show any PE but did show pneumonia.  She left AGAINST MEDICAL ADVICE but then came back because of worsening symptoms and was subsequently hospitalized.   Consultants: None  Procedures: None   Subjective: Feels slightly better this morning when terms of her shortness of breath and wheezing but continues to have cough.  Reports nausea this morning but no vomiting.  Denies abdominal pain.      Assessment/Plan:  * Influenza A with pneumonia- (present on admission) CT angiogram showed multifocal pneumonia.  Influenza was positive.  COVID was negative.  Did not have any oxygen requirements although she was quite tachypneic.  She was started on ceftriaxone azithromycin and Tamiflu.  Symptomatically she seems to be doing slightly better.  WBC remains normal.  She is afebrile.  Lungs sound better. We will change to oral antibiotics once her nausea improves. Troponins were normal.  HIV nonreactive.  TSH is normal. Due to concern for hypovolemia she was gently hydrated.  Continues to have some nausea this morning.  Continue IV fluids for now.  Asthma exacerbation- (present on admission) Continues to have wheezing though it seems to be better today compared to yesterday.  Continue nebulizer treatments and steroids.  Change steroids to  oral from tomorrow.   Does not follow with any provider for her asthma.  Uses as needed inhalers at home.  Not on any maintenance drugs.  Hypokalemia- (present on admission) Repleted.  Will give additional dose today.  Magnesium was 2.6 when last checked.  Thrombocytopenia (HCC)- (present on admission) Now resolved.  Likely triggered by viral infection.  .   Obesity Estimated body mass index is 40.34 kg/m as calculated from the following:   Height as of this encounter: 5\' 4"  (1.626 m).   Weight as of this encounter: 106.6 kg.    DVT Prophylaxis: Lovenox Code Status: Full code Family Communication: Discussed with patient Disposition Plan: Hopefully home in 24 to 48 hours.  Mobilize.  Status is: Inpatient Remains inpatient appropriate because: Multifocal pneumonia, asthma exacerbation       Medications: Scheduled:  benzonatate  100 mg Oral TID   budesonide (PULMICORT) nebulizer solution  0.25 mg Nebulization BID   enoxaparin (LOVENOX) injection  50 mg Subcutaneous Q24H   levalbuterol  0.63 mg Nebulization Q6H   methylPREDNISolone (SOLU-MEDROL) injection  40 mg Intravenous Q12H   oseltamivir  75 mg Oral BID   Continuous:  sodium chloride Stopped (12/12/21 0549)   azithromycin 250 mL/hr at 12/12/21 0646   cefTRIAXone (ROCEPHIN)  IV Stopped (12/12/21 0543)   promethazine (PHENERGAN) injection (IM or IVPB)     STM:[id number], ibuprofen, levalbuterol, ondansetron (ZOFRAN) IV  Antibiotics: Anti-infectives (From admission, onward)    Start     Dose/Rate Route Frequency Ordered Stop   12/11/21 1000  oseltamivir (TAMIFLU) capsule 75 mg  75 mg Oral 2 times daily 12/11/21 0556 12/16/21 0959   12/11/21 0600  cefTRIAXone (ROCEPHIN) 2 g in sodium chloride 0.9 % 100 mL IVPB        2 g 200 mL/hr over 30 Minutes Intravenous Every 24 hours 12/11/21 0556 12/16/21 0559   12/11/21 0600  azithromycin (ZITHROMAX) 500 mg in sodium chloride 0.9 % 250 mL IVPB        500 mg 250  mL/hr over 60 Minutes Intravenous Every 24 hours 12/11/21 0556 12/16/21 0559       Objective:  Vital Signs  Vitals:   12/12/21 0336 12/12/21 0402 12/12/21 0732 12/12/21 0810  BP: 123/68 (!) 110/57  113/82  Pulse: 94 91    Resp: 19 (!) 22  20  Temp: 97.9 F (36.6 C)   98.5 F (36.9 C)  TempSrc: Oral   Oral  SpO2: 93% 94% 94%   Weight:      Height:        Intake/Output Summary (Last 24 hours) at 12/12/2021 1056 Last data filed at 12/12/2021 0646 Gross per 24 hour  Intake 2428.09 ml  Output --  Net 2428.09 ml    Filed Weights   12/11/21 0419  Weight: 106.6 kg    General appearance: Awake alert.  In no distress Resp: Improved effort today compared to yesterday.  Fewer crackles.  Less wheezing.  Occasional rhonchi. Cardio: S1-S2 is normal regular.  No S3-S4.  No rubs murmurs or bruit GI: Abdomen is soft.  Nontender nondistended.  Bowel sounds are present normal.  No masses organomegaly Extremities: No edema.  Full range of motion of lower extremities. Neurologic: Alert and oriented x3.  No focal neurological deficits.     Lab Results:  Data Reviewed: I have personally reviewed labs and imaging study reports  CBC: Recent Labs  Lab 12/09/21 2230 12/10/21 0929 12/11/21 0625 12/12/21 0154  WBC 6.0 3.1* 6.7 6.0  NEUTROABS  --  2.4 5.7  --   HGB 14.5 13.4 12.3 12.0  HCT 42.7 39.0 37.2 37.6  MCV 87.7 88.0 89.4 91.7  PLT 125* 147* 142* 152     Basic Metabolic Panel: Recent Labs  Lab 12/09/21 2230 12/10/21 0929 12/11/21 0625 12/12/21 0154  NA 135 134* 137 136  K 2.9* 3.8 2.9* 3.5  CL 104 105 106 105  CO2 19* 22 19* 23  GLUCOSE 116* 145* 132* 117*  BUN 19 15 15 10   CREATININE 0.76 0.63 0.79 0.70  CALCIUM 9.0 9.1 8.0* 8.6*  MG  --  2.6*  --   --      GFR: Estimated Creatinine Clearance: 123.7 mL/min (by C-G formula based on SCr of 0.7 mg/dL).  Liver Function Tests: Recent Labs  Lab 12/09/21 2230 12/10/21 0929 12/11/21 0625  AST 26 25 22   ALT  20 19 17   ALKPHOS 57 50 46  BILITOT 0.4 0.4 0.5  PROT 8.4* 8.1 6.7  ALBUMIN 4.1 3.9 3.5     Recent Labs  Lab 12/09/21 2230  LIPASE 28     Thyroid Function Tests: Recent Labs    12/11/21 0625  TSH 0.533      Recent Results (from the past 240 hour(s))  Resp Panel by RT-PCR (Flu A&B, Covid) Nasopharyngeal Swab     Status: Abnormal   Collection Time: 12/09/21 11:48 PM   Specimen: Nasopharyngeal Swab; Nasopharyngeal(NP) swabs in vial transport medium  Result Value Ref Range Status   SARS Coronavirus 2 by RT PCR NEGATIVE NEGATIVE Final  Comment: (NOTE) SARS-CoV-2 target nucleic acids are NOT DETECTED.  The SARS-CoV-2 RNA is generally detectable in upper respiratory specimens during the acute phase of infection. The lowest concentration of SARS-CoV-2 viral copies this assay can detect is 138 copies/mL. A negative result does not preclude SARS-Cov-2 infection and should not be used as the sole basis for treatment or other patient management decisions. A negative result may occur with  improper specimen collection/handling, submission of specimen other than nasopharyngeal swab, presence of viral mutation(s) within the areas targeted by this assay, and inadequate number of viral copies(<138 copies/mL). A negative result must be combined with clinical observations, patient history, and epidemiological information. The expected result is Negative.  Fact Sheet for Patients:  BloggerCourse.com  Fact Sheet for Healthcare Providers:  SeriousBroker.it  This test is no t yet approved or cleared by the [place] FDA and  has been authorized for detection and/or diagnosis of SARS-CoV-2 by FDA under an Emergency Use Authorization (EUA). This EUA will remain  in effect (meaning this test can be used) for the duration of the COVID-19 declaration under Section 564(b)(1) of the Act, 21 U.S.C.section 360bbb-3(b)(1), unless the  authorization is terminated  or revoked sooner.       Influenza A by PCR POSITIVE (A) NEGATIVE Final   Influenza B by PCR NEGATIVE NEGATIVE Final    Comment: (NOTE) The Xpert Xpress SARS-CoV-2/FLU/RSV plus assay is intended as an aid in the diagnosis of influenza from Nasopharyngeal swab specimens and should not be used as a sole basis for treatment. Nasal washings and aspirates are unacceptable for Xpert Xpress SARS-CoV-2/FLU/RSV testing.  Fact Sheet for Patients: BloggerCourse.com  Fact Sheet for Healthcare Providers: SeriousBroker.it  This test is not yet approved or cleared by the [place] FDA and has been authorized for detection and/or diagnosis of SARS-CoV-2 by FDA under an Emergency Use Authorization (EUA). This EUA will remain in effect (meaning this test can be used) for the duration of the COVID-19 declaration under Section 564(b)(1) of the Act, 21 U.S.C. section 360bbb-3(b)(1), unless the authorization is terminated or revoked.  Performed at [facility], [address], [place], [place] [address]        Radiology Studies: No results found.     LOS: 1 day   Zachari Alberta [employer] on [email or web address]  12/12/2021, 10:56 AM

## 2021-12-13 LAB — BASIC METABOLIC PANEL
Anion gap: 9 (ref 5–15)
BUN: 11 mg/dL (ref 6–20)
CO2: 23 mmol/L (ref 22–32)
Calcium: 8.7 mg/dL — ABNORMAL LOW (ref 8.9–10.3)
Chloride: 103 mmol/L (ref 98–111)
Creatinine, Ser: 0.68 mg/dL (ref 0.44–1.00)
GFR, Estimated: >60 mL/min (ref >60)
Glucose, Bld: 125 mg/dL — ABNORMAL HIGH (ref 70–99)
Potassium: 4 mmol/L (ref 3.5–5.1)
Sodium: 135 mmol/L (ref 135–145)

## 2021-12-13 LAB — LEGIONELLA PNEUMOPHILA SEROGP 1 UR AG: L. pneumophila Serogp 1 Ur Ag: NEGATIVE

## 2021-12-13 MED ORDER — CEFDINIR 300 MG PO CAPS
300.0000 mg | ORAL_CAPSULE | Freq: Two times a day (BID) | ORAL | Status: DC
Start: 2021-12-13 — End: 2021-12-14
  Administered 2021-12-13 – 2021-12-14 (×3): 300 mg via ORAL
  Filled 2021-12-13 (×3): qty 1

## 2021-12-13 MED ORDER — HYDROCOD POLI-CHLORPHE POLI ER 10-8 MG/5ML PO SUER
5.0000 mL | Freq: Two times a day (BID) | ORAL | Status: DC | PRN
  Administered 2021-12-13 – 2021-12-14 (×2): 5 mL via ORAL
  Filled 2021-12-13 (×2): qty 5

## 2021-12-13 MED ORDER — PREDNISONE 20 MG PO TABS
40.0000 mg | ORAL_TABLET | Freq: Two times a day (BID) | ORAL | Status: DC
  Administered 2021-12-13 – 2021-12-14 (×2): 40 mg via ORAL
  Filled 2021-12-13 (×2): qty 2

## 2021-12-13 NOTE — Progress Notes (Signed)
[facility] PROGRESS NOTE   Sharrie Self ZOX:[id number] DOB: May 03, 1991 DOA: 12/11/2021  2 DOS: the patient was seen and examined on 12/13/2021  PCP: Patient, No Pcp Per (Inactive)  Brief History and Hospital Course:  31 y.o. female with history of asthma and tobacco abuse presented to the ER for the second time with complaints of persistent shortness of breath.  Patient's symptoms started about 4 days prior to admission.  Patient states her fianc also was sick.  Patient progressively got more short of breath with some chest pressure.  Has been having some fever chills.  Denies any productive cough.  Found to have influenza.  CT angiogram did not show any PE but did show pneumonia.  She left AGAINST MEDICAL ADVICE but then came back because of worsening symptoms and was subsequently hospitalized.  Patient is gradually improving.   Consultants: None  Procedures: None   Subjective: Patient feels better.  Nausea is improving.  Continues to have cough which is dry.  No chest pain.     Assessment/Plan:  * Influenza A with pneumonia- (present on admission) CT angiogram showed multifocal pneumonia.  Influenza was positive.  COVID was negative.  Did not have any oxygen requirements although she was quite tachypneic.  She was started on ceftriaxone azithromycin and Tamiflu.  Troponins were normal.  HIV nonreactive.  TSH is normal. Patient is gradually improving.  Wheezing appears to be getting better.  Dyspnea and cough is improving. No further nausea.  Will change to oral antibiotics.  Asthma exacerbation- (present on admission) Stable for now.  Wheezing is improving.  Continue nebulizer treatment and steroids.  Change steroids to oral.    Does not follow with any provider for her asthma.  Uses as needed inhalers at home.  Not on any maintenance drugs.  Hypokalemia- (present on admission) Repleted.   Thrombocytopenia (HCC)- (present on admission) Now resolved.  Likely  triggered by viral infection.  .   Obesity Estimated body mass index is 40.34 kg/m as calculated from the following:   Height as of this encounter: 5\' 4"  (1.626 m).   Weight as of this encounter: 106.6 kg.    DVT Prophylaxis: Lovenox Code Status: Full code Family Communication: Discussed with patient Disposition Plan: Hopefully discharge tomorrow.  Status is: Inpatient Remains inpatient appropriate because: Multifocal pneumonia, asthma exacerbation       Medications: Scheduled:  azithromycin  500 mg Oral Daily   benzonatate  100 mg Oral TID   budesonide (PULMICORT) nebulizer solution  0.25 mg Nebulization BID   enoxaparin (LOVENOX) injection  50 mg Subcutaneous Q24H   levalbuterol  0.63 mg Nebulization Q6H   methylPREDNISolone (SOLU-MEDROL) injection  40 mg Intravenous Q12H   oseltamivir  75 mg Oral BID   Continuous:  cefTRIAXone (ROCEPHIN)  IV Stopped (12/13/21 0535)   12/15/21, ibuprofen, levalbuterol, ondansetron (ZOFRAN) IV  Antibiotics: Anti-infectives (From admission, onward)    Start     Dose/Rate Route Frequency Ordered Stop   12/13/21 1000  azithromycin (ZITHROMAX) tablet 500 mg        500 mg Oral Daily 12/12/21 1350 12/16/21 0959   12/11/21 1000  oseltamivir (TAMIFLU) capsule 75 mg        75 mg Oral 2 times daily 12/11/21 0556 12/16/21 0959   12/11/21 0600  cefTRIAXone (ROCEPHIN) 2 g in sodium chloride 0.9 % 100 mL IVPB        2 g 200 mL/hr over 30 Minutes Intravenous Every 24 hours 12/11/21 0556 12/16/21 0559  12/11/21 0600  azithromycin (ZITHROMAX) 500 mg in sodium chloride 0.9 % 250 mL IVPB  Status:  Discontinued        500 mg 250 mL/hr over 60 Minutes Intravenous Every 24 hours 12/11/21 0556 12/12/21 1350       Objective:  Vital Signs  Vitals:   12/13/21 0319 12/13/21 0332 12/13/21 0804 12/13/21 0813  BP:  111/76  117/76  Pulse:  68  69  Resp:  19  20  Temp:  98 F (36.7 C)  98.4 F (36.9 C)  TempSrc:  Oral  Oral  SpO2: 95%  94% 92% 93%  Weight:      Height:        Intake/Output Summary (Last 24 hours) at 12/13/2021 1124 Last data filed at 12/13/2021 0658 Gross per 24 hour  Intake 2254.08 ml  Output --  Net 2254.08 ml    Filed Weights   12/11/21 0419  Weight: 106.6 kg    General appearance: Awake alert.  In no distress Resp: Improved effort.  Less wheezing and rhonchi compared to before.  Few crackles at the bases. Cardio: S1-S2 is normal regular.  No S3-S4.  No rubs murmurs or bruit GI: Abdomen is soft.  Nontender nondistended.  Bowel sounds are present normal.  No masses organomegaly Extremities: No edema.  Full range of motion of lower extremities. Neurologic: Alert and oriented x3.  No focal neurological deficits.      Lab Results:  Data Reviewed: I have personally reviewed labs and imaging study reports  CBC: Recent Labs  Lab 12/09/21 2230 12/10/21 0929 12/11/21 0625 12/12/21 0154  WBC 6.0 3.1* 6.7 6.0  NEUTROABS  --  2.4 5.7  --   HGB 14.5 13.4 12.3 12.0  HCT 42.7 39.0 37.2 37.6  MCV 87.7 88.0 89.4 91.7  PLT 125* 147* 142* 152     Basic Metabolic Panel: Recent Labs  Lab 12/09/21 2230 12/10/21 0929 12/11/21 0625 12/12/21 0154 12/13/21 0030  NA 135 134* 137 136 135  K 2.9* 3.8 2.9* 3.5 4.0  CL 104 105 106 105 103  CO2 19* 22 19* 23 23  GLUCOSE 116* 145* 132* 117* 125*  BUN 19 15 15 10 11   CREATININE 0.76 0.63 0.79 0.70 0.68  CALCIUM 9.0 9.1 8.0* 8.6* 8.7*  MG  --  2.6*  --   --   --      GFR: Estimated Creatinine Clearance: 123.7 mL/min (by C-G formula based on SCr of 0.68 mg/dL).  Liver Function Tests: Recent Labs  Lab 12/09/21 2230 12/10/21 0929 12/11/21 0625  AST 26 25 22   ALT 20 19 17   ALKPHOS 57 50 46  BILITOT 0.4 0.4 0.5  PROT 8.4* 8.1 6.7  ALBUMIN 4.1 3.9 3.5     Recent Labs  Lab 12/09/21 2230  LIPASE 28     Thyroid Function Tests: Recent Labs    12/11/21 0625  TSH 0.533      Recent Results (from the past 240 hour(s))  Resp  Panel by RT-PCR (Flu A&B, Covid) Nasopharyngeal Swab     Status: Abnormal   Collection Time: 12/09/21 11:48 PM   Specimen: Nasopharyngeal Swab; Nasopharyngeal(NP) swabs in vial transport medium  Result Value Ref Range Status   SARS Coronavirus 2 by RT PCR NEGATIVE NEGATIVE Final    Comment: (NOTE) SARS-CoV-2 target nucleic acids are NOT DETECTED.  The SARS-CoV-2 RNA is generally detectable in upper respiratory specimens during the acute phase of infection. The lowest concentration of SARS-CoV-2 viral  copies this assay can detect is 138 copies/mL. A negative result does not preclude SARS-Cov-2 infection and should not be used as the sole basis for treatment or other patient management decisions. A negative result may occur with  improper specimen collection/handling, submission of specimen other than nasopharyngeal swab, presence of viral mutation(s) within the areas targeted by this assay, and inadequate number of viral copies(<138 copies/mL). A negative result must be combined with clinical observations, patient history, and epidemiological information. The expected result is Negative.  Fact Sheet for Patients:  BloggerCourse.com  Fact Sheet for Healthcare Providers:  SeriousBroker.it  This test is no t yet approved or cleared by the [place] FDA and  has been authorized for detection and/or diagnosis of SARS-CoV-2 by FDA under an Emergency Use Authorization (EUA). This EUA will remain  in effect (meaning this test can be used) for the duration of the COVID-19 declaration under Section 564(b)(1) of the Act, 21 U.S.C.section 360bbb-3(b)(1), unless the authorization is terminated  or revoked sooner.       Influenza A by PCR POSITIVE (A) NEGATIVE Final   Influenza B by PCR NEGATIVE NEGATIVE Final    Comment: (NOTE) The Xpert Xpress SARS-CoV-2/FLU/RSV plus assay is intended as an aid in the diagnosis of influenza from  Nasopharyngeal swab specimens and should not be used as a sole basis for treatment. Nasal washings and aspirates are unacceptable for Xpert Xpress SARS-CoV-2/FLU/RSV testing.  Fact Sheet for Patients: BloggerCourse.com  Fact Sheet for Healthcare Providers: SeriousBroker.it  This test is not yet approved or cleared by the [place] FDA and has been authorized for detection and/or diagnosis of SARS-CoV-2 by FDA under an Emergency Use Authorization (EUA). This EUA will remain in effect (meaning this test can be used) for the duration of the COVID-19 declaration under Section 564(b)(1) of the Act, 21 U.S.C. section 360bbb-3(b)(1), unless the authorization is terminated or revoked.  Performed at [facility], [address], [place], [place] [address]        Radiology Studies: No results found.     LOS: 2 days   Jacqueline Spofford Rito Ehrlich  [facility] Pager on [email or web address]  12/13/2021, 11:24 AM

## 2021-12-13 NOTE — TOC Initial Note (Signed)
Transition of Care [facility]) - Initial/Assessment Note    Patient Details  Name: Mercedes [name] MRN: [id number] Date of Birth: Apr 16, 1991  [facility] CM/SW Contact:    Cyndi Bender, RN Phone Number: 12/13/2021, 12:16 PM  Clinical Narrative:               Damaris Schooner to patient regarding PCP needs. Patient is agreeable to [facility] making her a PCP apt. I notified her that the appointment information will be on her discharge instructions. Patient states she has transportation once discharged.  No other TOC needs at this time.      Expected Discharge Plan: Home/Self Care Barriers to Discharge: Continued Medical Work up   Patient Goals and CMS Choice Patient states their goals for this hospitalization and ongoing recovery are:: return home      Expected Discharge Plan and Services Expected Discharge Plan: Home/Self Care   Discharge Planning Services: CM Consult   Living arrangements for the past 2 months: Single Family Home                                      Prior Living Arrangements/Services Living arrangements for the past 2 months: Single Family Home Lives with:: Significant Other Patient language and need for interpreter reviewed:: Yes Do you feel safe going back to the place where you live?: Yes      Need for Family Participation in Patient Care: Yes (Comment) Care giver support system in place?: Yes (comment)   Criminal Activity/Legal Involvement Pertinent to Current Situation/Hospitalization: No - Comment as needed  Activities of Daily Living Home Assistive Devices/Equipment: None ADL Screening (condition at time of admission) Patient's cognitive ability adequate to safely complete daily activities?: Yes Is the patient deaf or have difficulty hearing?: No Does the patient have difficulty seeing, even when wearing glasses/contacts?: No Does the patient have difficulty concentrating, remembering, or making decisions?: No Patient able to express  need for assistance with ADLs?: Yes Does the patient have difficulty dressing or bathing?: No Independently performs ADLs?: Yes (appropriate for developmental age) Does the patient have difficulty walking or climbing stairs?: No Weakness of Legs: None Weakness of Arms/Hands: None  Permission Sought/Granted                  Emotional Assessment Appearance:: Appears stated age Attitude/Demeanor/Rapport: Engaged Affect (typically observed): Accepting Orientation: : Oriented to Self, Oriented to Place, Oriented to  Time, Oriented to Situation Alcohol / Substance Use: Tobacco Use Psych Involvement: No (comment)  Admission diagnosis:  Influenzal pneumonia [J11.00] Severe persistent asthma with exacerbation [J45.51] Influenza A with pneumonia [J09.X1] Multifocal pneumonia [J18.9] Patient Active Problem List   Diagnosis Date Noted   Multifocal pneumonia 12/11/2021   Thrombocytopenia ([place]) 12/11/2021   Influenza A with pneumonia 12/10/2021   Obesity, Class III, BMI 40-49.9 (morbid obesity) ([place]) 12/10/2021   Tobacco abuse 12/10/2021   SVD (spontaneous vaginal delivery) 08/08/2017   Post-dates pregnancy 08/06/2017   Supervision of high risk pregnancy, antepartum 06/08/2017   Insufficient prenatal care in third trimester 06/08/2017   Substance abuse affecting pregnancy in third trimester, antepartum 06/08/2017   Obesity in pregnancy with antepartum complication 0000000   Obesity (BMI 30-39.9) 06/08/2017   Nausea and vomiting of pregnancy, antepartum 06/08/2017   Asthma exacerbation 02/27/2016   Anemia in pregnancy 02/27/2016   Hypokalemia 02/27/2016   Asthma affecting pregnancy, antepartum 02/27/2016   PCP:  Patient,  No Pcp Per (Inactive) Pharmacy:   [id number] [address] [place], [place] [id number] [place] [id number] Phone: [phone] Fax: [phone]  [id number] [place], [place] [address] Phone: [phone] Fax: [phone]     Social Determinants of Health (SDOH) Interventions    Readmission Risk Interventions No flowsheet data found.

## 2021-12-14 LAB — BASIC METABOLIC PANEL
Anion gap: 8 (ref 5–15)
BUN: 12 mg/dL (ref 6–20)
CO2: 26 mmol/L (ref 22–32)
Calcium: 8.6 mg/dL — ABNORMAL LOW (ref 8.9–10.3)
Chloride: 101 mmol/L (ref 98–111)
Creatinine, Ser: 0.89 mg/dL (ref 0.44–1.00)
GFR, Estimated: >60 mL/min (ref >60)
Glucose, Bld: 111 mg/dL — ABNORMAL HIGH (ref 70–99)
Potassium: 4.1 mmol/L (ref 3.5–5.1)
Sodium: 135 mmol/L (ref 135–145)

## 2021-12-14 MED ORDER — HYDROCOD POLI-CHLORPHE POLI ER 10-8 MG/5ML PO SUER: 5.0000 mL | Freq: Two times a day (BID) | ORAL | 0 refills | Status: DC | PRN

## 2021-12-14 MED ORDER — ALBUTEROL SULFATE HFA 108 (90 BASE) MCG/ACT IN AERS: 1.0000 | INHALATION_SPRAY | Freq: Four times a day (QID) | RESPIRATORY_TRACT | 1 refills | Status: DC | PRN

## 2021-12-14 MED ORDER — PREDNISONE 20 MG PO TABS: ORAL_TABLET | ORAL | 0 refills | Status: DC

## 2021-12-14 MED ORDER — AZITHROMYCIN 500 MG PO TABS: 500.0000 mg | ORAL_TABLET | Freq: Every day | ORAL | 0 refills | Status: AC

## 2021-12-14 MED ORDER — OSELTAMIVIR PHOSPHATE 75 MG PO CAPS: 75.0000 mg | ORAL_CAPSULE | Freq: Two times a day (BID) | ORAL | 0 refills | Status: AC

## 2021-12-14 MED ORDER — CEFDINIR 300 MG PO CAPS: 300.0000 mg | ORAL_CAPSULE | Freq: Two times a day (BID) | ORAL | 0 refills | Status: AC

## 2021-12-14 MED ORDER — ONDANSETRON HCL 4 MG PO TABS: 4.0000 mg | ORAL_TABLET | Freq: Every day | ORAL | 0 refills | Status: DC | PRN

## 2021-12-14 MED ORDER — ALBUTEROL SULFATE (2.5 MG/3ML) 0.083% IN NEBU: 2.5000 mg | INHALATION_SOLUTION | Freq: Four times a day (QID) | RESPIRATORY_TRACT | 2 refills | Status: DC | PRN

## 2021-12-14 NOTE — Discharge Summary (Signed)
[facility]  Physician Discharge Summary   Patient ID: Mercedes [name] MRN: [id number] DOB/AGE: [birth year] 31 y.o.  Admit date: 12/11/2021 Discharge date: 12/14/2021    PCP: Patient, No Pcp Per (Inactive)  DISCHARGE DIAGNOSES:  Principal Problem:   Influenza A with pneumonia Active Problems:   Asthma exacerbation   Hypokalemia   Thrombocytopenia (HCC)   Multifocal pneumonia   RECOMMENDATIONS FOR OUTPATIENT FOLLOW UP: Patient has [facility]   Home Health: None Equipment/Devices: None  CODE STATUS: Full code  DISCHARGE CONDITION: fair  Diet recommendation: As before  INITIAL HISTORY: 31 y.o. female with history of asthma and tobacco abuse presented to the ER for the second time with complaints of persistent shortness of breath.  Patient's symptoms started about 4 days prior to admission.  Patient states her fianc also was sick.  Patient progressively got more short of breath with some chest pressure.  Has been having some fever chills.  Denies any productive cough.  Found to have influenza.  CT angiogram did not show any PE but did show pneumonia.  She left AGAINST MEDICAL ADVICE but then came back because of worsening symptoms and was subsequently hospitalized.  Patient was placed on antibiotics steroids nebulizer treatments.  She has improved significantly.   HOSPITAL COURSE:   * Influenza A with pneumonia- (present on admission) CT angiogram showed multifocal pneumonia.  Influenza was positive.  COVID was negative.  Did not have any oxygen requirements although she was quite tachypneic.  She was started on ceftriaxone azithromycin and Tamiflu.  Troponins were normal.  HIV nonreactive.  TSH is normal. Patient has improved.  Asthma exacerbation- (present on admission) Likely triggered by influenza.  Placed on steroids nebulizer treatments and antibiotics.  Improving gradually.   Ambulating without difficulty.  Saturations are normal.  Hypokalemia- (present on admission) Repleted.   Thrombocytopenia (HCC)- (present on admission) Now resolved.  Likely triggered by viral infection.  .   Obesity Estimated body mass index is 40.34 kg/m as calculated from the following:   Height as of this encounter: 5\' 4"  (1.626 m).   Weight as of this encounter: 106.6 kg.  Patient feels well.  Has been ambulating without difficulty.  Shortness of breath is improved.  Okay for discharge home.   PERTINENT LABS:  The results of significant diagnostics from this hospitalization (including imaging, microbiology, ancillary and laboratory) are listed below for reference.    Microbiology: Recent Results (from the past 240 hour(s))  Resp Panel by RT-PCR (Flu A&B, Covid) Nasopharyngeal Swab     Status: Abnormal   Collection Time: 12/09/21 11:48 PM   Specimen: Nasopharyngeal Swab; Nasopharyngeal(NP) swabs in vial transport medium  Result Value Ref Range Status   SARS Coronavirus 2 by RT PCR NEGATIVE NEGATIVE Final    Comment: (NOTE) SARS-CoV-2 target nucleic acids are NOT DETECTED.  The SARS-CoV-2 RNA is generally detectable in upper respiratory specimens during the acute phase of infection. The lowest concentration of SARS-CoV-2 viral copies this assay can detect is 138 copies/mL. A negative result does not preclude SARS-Cov-2 infection and should not be used as the sole basis for treatment or other patient management decisions. A negative result may occur with  improper specimen collection/handling, submission of specimen other than nasopharyngeal swab, presence of viral mutation(s) within the areas targeted by this assay, and inadequate number of viral copies(<138 copies/mL). A negative result must be combined with clinical observations, patient history, and epidemiological information.  The expected result is Negative.  Fact Sheet for Patients:   BloggerCourse.com  Fact Sheet for Healthcare Providers:  SeriousBroker.it  This test is no t yet approved or cleared by the [place] FDA and  has been authorized for detection and/or diagnosis of SARS-CoV-2 by FDA under an Emergency Use Authorization (EUA). This EUA will remain  in effect (meaning this test can be used) for the duration of the COVID-19 declaration under Section 564(b)(1) of the Act, 21 U.S.C.section 360bbb-3(b)(1), unless the authorization is terminated  or revoked sooner.       Influenza A by PCR POSITIVE (A) NEGATIVE Final   Influenza B by PCR NEGATIVE NEGATIVE Final    Comment: (NOTE) The Xpert Xpress SARS-CoV-2/FLU/RSV plus assay is intended as an aid in the diagnosis of influenza from Nasopharyngeal swab specimens and should not be used as a sole basis for treatment. Nasal washings and aspirates are unacceptable for Xpert Xpress SARS-CoV-2/FLU/RSV testing.  Fact Sheet for Patients: BloggerCourse.com  Fact Sheet for Healthcare Providers: SeriousBroker.it  This test is not yet approved or cleared by the [place] FDA and has been authorized for detection and/or diagnosis of SARS-CoV-2 by FDA under an Emergency Use Authorization (EUA). This EUA will remain in effect (meaning this test can be used) for the duration of the COVID-19 declaration under Section 564(b)(1) of the Act, 21 U.S.C. section 360bbb-3(b)(1), unless the authorization is terminated or revoked.  Performed at [facility], [address] [place], [place] [address]      Labs:  COVID-19 Labs   Lab Results  Component Value Date   SARSCOV2NAA NEGATIVE 12/09/2021      Basic Metabolic Panel: Recent Labs  Lab 12/10/21 0929 12/11/21 0625 12/12/21 0154 12/13/21 0030 12/14/21 0109  NA 134* 137 136 135 135  K 3.8 2.9* 3.5 4.0 4.1  CL 105 106 105 103 101   CO2 22 19* 23 23 26   GLUCOSE 145* 132* 117* 125* 111*  BUN 15 15 10 11 12   CREATININE 0.63 0.79 0.70 0.68 0.89  CALCIUM 9.1 8.0* 8.6* 8.7* 8.6*  MG 2.6*  --   --   --   --    Liver Function Tests: Recent Labs  Lab 12/09/21 2230 12/10/21 0929 12/11/21 0625  AST 26 25 22   ALT 20 19 17   ALKPHOS 57 50 46  BILITOT 0.4 0.4 0.5  PROT 8.4* 8.1 6.7  ALBUMIN 4.1 3.9 3.5   Recent Labs  Lab 12/09/21 2230  LIPASE 28    CBC: Recent Labs  Lab 12/09/21 2230 12/10/21 0929 12/11/21 0625 12/12/21 0154  WBC 6.0 3.1* 6.7 6.0  NEUTROABS  --  2.4 5.7  --   HGB 14.5 13.4 12.3 12.0  HCT 42.7 39.0 37.2 37.6  MCV 87.7 88.0 89.4 91.7  PLT 125* 147* 142* 152     IMAGING STUDIES CT Angio Chest PE W/Cm &/Or Wo Cm  Result Date: 12/10/2021 CLINICAL DATA:  Shortness of breath, flu-like symptoms EXAM: CT ANGIOGRAPHY CHEST WITH CONTRAST TECHNIQUE: Multidetector CT imaging of the chest was performed using the standard protocol during bolus administration of intravenous contrast. Multiplanar CT image reconstructions and MIPs were obtained to evaluate the vascular anatomy. RADIATION DOSE REDUCTION: This exam was performed according to the departmental dose-optimization program which includes automated exposure control, adjustment of the mA and/or kV according to patient size and/or use of iterative reconstruction technique. CONTRAST:  12/12/21 OMNIPAQUE IOHEXOL 350 MG/ML SOLN COMPARISON:  Chest radiograph dated 12/09/2021 FINDINGS: Cardiovascular:  Satisfactory opacification the bilateral pulmonary arteries to the lobar level. No evidence of pulmonary embolism. Although not tailored for evaluation of the thoracic aorta, there is no evidence of thoracic aortic aneurysm or dissection. The heart is normal in size.  No pericardial effusion. Mediastinum/Nodes: No suspicious mediastinal lymphadenopathy. Visualized thyroid is unremarkable. Lungs/Pleura: Faint patchy/ground-glass opacities in the lungs bilaterally,  lower lobe predominant, suggesting mild infection/pneumonia. Atypical/viral etiologies including COVID are possible. No suspicious pulmonary nodules. No pleural effusion or pneumothorax. Upper Abdomen: Visualized upper abdomen is grossly unremarkable. Musculoskeletal: Visualized osseous structures are within normal limits. Review of the MIP images confirms the above findings. IMPRESSION: No evidence of pulmonary embolism. Mild multifocal pneumonia. Atypical/viral etiologies including COVID are possible. Electronically Signed   By: Charline Bills M.D.   On: 12/10/2021 03:53   DG Chest Portable 1 View  Result Date: 12/09/2021 CLINICAL DATA:  Dyspnea, history of asthma. EXAM: PORTABLE CHEST 1 VIEW COMPARISON:  02/27/2016 FINDINGS: The heart size and mediastinal contours are within normal limits. Both lungs are clear. No acute osseous abnormality. IMPRESSION: No active disease. Electronically Signed   By: Thornell Sartorius M.D.   On: 12/09/2021 22:59    DISCHARGE EXAMINATION: Vitals:   12/14/21 0627 12/14/21 0753 12/14/21 0801 12/14/21 0803  BP: 112/77 115/79    Pulse: 61 60    Resp: (!) 22 20    Temp: 98.1 F (36.7 C) 98.2 F (36.8 C)    TempSrc: Oral Oral    SpO2: 95% 92% 95% 92%  Weight:      Height:       General appearance: Awake alert.  In no distress Resp: Improved air entry bilaterally.  Less wheezing and less rhonchi compared to before.  Normal effort at rest. Cardio: S1-S2 is normal regular.  No S3-S4.  No rubs murmurs or bruit GI: Abdomen is soft.  Nontender nondistended.  Bowel sounds are present normal.  No masses organomegaly   DISPOSITION: Home  Discharge Instructions     Call MD for:  difficulty breathing, headache or visual disturbances   Complete by: As directed    Call MD for:  extreme fatigue   Complete by: As directed    Call MD for:  persistant dizziness or light-headedness   Complete by: As directed    Call MD for:  persistant nausea and vomiting   Complete by:  As directed    Call MD for:  severe uncontrolled pain   Complete by: As directed    Call MD for:  temperature >100.4   Complete by: As directed    Diet - low sodium heart healthy   Complete by: As directed    Discharge instructions   Complete by: As directed    Please take your medications as prescribed.  Please be sure to follow-up with primary care provider. Discuss maintenance treatment for asthma with them.  You were cared for by a hospitalist during your hospital stay. If you have any questions about your discharge medications or the care you received while you were in the hospital after you are discharged, you can call the unit and asked to speak with the hospitalist on call if the hospitalist that took care of you is not available. Once you are discharged, your primary care physician will handle any further medical issues. Please note that NO REFILLS for any discharge medications will be authorized once you are discharged, as it is imperative that you return to your primary care physician (or establish a relationship with a  primary care physician if you do not have one) for your aftercare needs so that they can reassess your need for medications and monitor your lab values. If you do not have a primary care physician, you can call [phone] for a physician referral.   Increase activity slowly   Complete by: As directed          Allergies as of 12/14/2021       Reactions   Penicillins Other (See Comments)   Reaction:  Unknown  Has patient had a PCN reaction causing immediate rash, facial/tongue/throat swelling, SOB or lightheadedness with hypotension: Unknown Has patient had a PCN reaction causing severe rash involving mucus membranes or skin necrosis: Unknown Has patient had a PCN reaction that required hospitalization: Unknown Has patient had a PCN reaction occurring within the last 10 years: No If all of the above answers are "NO", then may proceed with Cephalosporin  use. Unknown reaction Childhood allergy        Medication List     TAKE these medications    albuterol (2.5 MG/3ML) 0.083% nebulizer solution Commonly known as: PROVENTIL Take 3 mLs (2.5 mg total) by nebulization every 6 (six) hours as needed for wheezing or shortness of breath.   albuterol 108 (90 Base) MCG/ACT inhaler Commonly known as: VENTOLIN HFA Inhale 1-2 puffs into the lungs every 6 (six) hours as needed for wheezing or shortness of breath.   azithromycin 500 MG tablet Commonly known as: ZITHROMAX Take 1 tablet (500 mg total) by mouth daily for 3 days. Start taking on: December 15, 2021   cefdinir 300 MG capsule Commonly known as: OMNICEF Take 1 capsule (300 mg total) by mouth every 12 (twelve) hours for 3 days.   chlorpheniramine-HYDROcodone 10-8 MG/5ML Take 5 mLs by mouth every 12 (twelve) hours as needed for cough.   ondansetron 4 MG tablet Commonly known as: Zofran Take 1 tablet (4 mg total) by mouth daily as needed for nausea or vomiting.   oseltamivir 75 MG capsule Commonly known as: TAMIFLU Take 1 capsule (75 mg total) by mouth 2 (two) times daily for 2 days.   predniSONE 20 MG tablet Commonly known as: DELTASONE Take 3 tablets once daily for 3 days followed by 2 tablets once daily for 3 days followed by 1 tablet once daily for 3 days and then stop          Follow-up Information     [facility] Follow up.   Why: TIME : 9:30 AM DATE: [facility] NEW LOCATION : [address] [id number] Contact information: [address] [place] [address] [phone]                TOTAL DISCHARGE TIME: 35 minutes  Eann Cleland Rito Ehrlich  [facility] Pager on [email or web address]  12/14/2021, 11:30 AM

## 2021-12-14 NOTE — Progress Notes (Signed)
Discharge instructions reviewed with pt and significant other.  Copy of instructions given to pt, informed scripts sent to her pharmacy for pick up.  Pt d/c'd with belongings, with significant other.  Pt declined wheelchair for discharge out.  Steady gait.

## 2022-01-05 NOTE — Progress Notes (Deleted)
Patient ID: Mercedes [name], female   DOB: 12/30/1990, 31 y.o.   MRN: [id number] ? ? ?After hospitalization 2/18-2/21/2023 ? ?From discharge summary: ?DISCHARGE DIAGNOSES:  ?Principal Problem: ?  Influenza A with pneumonia ?Active Problems: ?  Asthma exacerbation ?  Hypokalemia ?  Thrombocytopenia (HCC) ?  Multifocal pneumonia ? ? ?INITIAL HISTORY: ?31 y.o. female with history of asthma and tobacco abuse presented to the ER for the second time with complaints of persistent shortness of breath.  Patient's symptoms started about 4 days prior to admission.  Patient states her fianc? also was sick.  Patient progressively got more short of breath with some chest pressure.  Has been having some fever chills.  Denies any productive cough.  Found to have influenza.  CT angiogram did not show any PE but did show pneumonia.  She left AGAINST MEDICAL ADVICE but then came back because of worsening symptoms and was subsequently hospitalized.  Patient was placed on antibiotics steroids nebulizer treatments.  She has improved significantly. ?  ?  ?HOSPITAL COURSE:  ?  ?* Influenza A with pneumonia- (present on admission) ?CT angiogram showed multifocal pneumonia.  Influenza was positive.  COVID was negative.  ?Did not have any oxygen requirements although she was quite tachypneic.  She was started on ceftriaxone azithromycin and Tamiflu.  ?Troponins were normal.  HIV nonreactive.  TSH is normal. ?Patient has improved. ?  ?Asthma exacerbation- (present on admission) ?Likely triggered by influenza.  Placed on steroids nebulizer treatments and antibiotics.  Improving gradually.  Ambulating without difficulty.  Saturations are normal. ?  ?Hypokalemia- (present on admission) ?Repleted.  ?  ?Thrombocytopenia (HCC)- (present on admission) ?Now resolved.  Likely triggered by viral infection.  . ?  ?  ?Obesity ?Estimated body mass index is 40.34 kg/m? as calculated from the following: ?  Height as of this encounter: 5\' 4"  (1.626 m). ?   Weight as of this encounter: 106.6 kg. ?  ?Patient feels well.  Has been ambulating without difficulty.  Shortness of breath is improved.  Okay for discharge home. ?  ?

## 2022-01-06 ENCOUNTER — Inpatient Hospital Stay: Payer: Medicaid Other | Admitting: Physician Assistant

## 2022-07-28 ENCOUNTER — Inpatient Hospital Stay (HOSPITAL_COMMUNITY)
Admission: AD | Admit: 2022-07-28 | Discharge: 2022-07-28 | Disposition: A | Discharge status: Home / Self Care | Payer: Medicaid Other | Attending: Obstetrics and Gynecology | Admitting: Obstetrics and Gynecology

## 2022-07-28 ENCOUNTER — Encounter (HOSPITAL_COMMUNITY): Payer: Self-pay | Admitting: *Deleted

## 2022-07-28 DIAGNOSIS — Z3A01 Less than 8 weeks gestation of pregnancy: Secondary | ICD-10-CM | POA: Diagnosis not present

## 2022-07-28 DIAGNOSIS — O219 Vomiting of pregnancy, unspecified: Secondary | ICD-10-CM | POA: Insufficient documentation

## 2022-07-28 DIAGNOSIS — R109 Unspecified abdominal pain: Secondary | ICD-10-CM | POA: Insufficient documentation

## 2022-07-28 DIAGNOSIS — Z640 Problems related to unwanted pregnancy: Secondary | ICD-10-CM | POA: Insufficient documentation

## 2022-07-28 DIAGNOSIS — O26891 Other specified pregnancy related conditions, first trimester: Secondary | ICD-10-CM | POA: Insufficient documentation

## 2022-07-28 LAB — URINALYSIS, MICROSCOPIC (REFLEX)

## 2022-07-28 LAB — COMPREHENSIVE METABOLIC PANEL
ALT: 12 U/L (ref 0–44)
AST: 15 U/L (ref 15–41)
Albumin: 3.9 g/dL (ref 3.5–5.0)
Alkaline Phosphatase: 71 U/L (ref 38–126)
Anion gap: 12 (ref 5–15)
BUN: 15 mg/dL (ref 6–20)
CO2: 21 mmol/L — ABNORMAL LOW (ref 22–32)
Calcium: 9.2 mg/dL (ref 8.9–10.3)
Chloride: 104 mmol/L (ref 98–111)
Creatinine, Ser: 0.67 mg/dL (ref 0.44–1.00)
GFR, Estimated: >60 mL/min (ref >60)
Glucose, Bld: 72 mg/dL (ref 70–99)
Potassium: 3.4 mmol/L — ABNORMAL LOW (ref 3.5–5.1)
Sodium: 137 mmol/L (ref 135–145)
Total Bilirubin: 1 mg/dL (ref 0.3–1.2)
Total Protein: 7.5 g/dL (ref 6.5–8.1)

## 2022-07-28 LAB — URINALYSIS, ROUTINE W REFLEX MICROSCOPIC
Glucose, UA: NEGATIVE mg/dL
Hgb urine dipstick: NEGATIVE
Ketones, ur: >80 mg/dL — AB
Leukocytes,Ua: NEGATIVE
Nitrite: NEGATIVE
Protein, ur: 30 mg/dL — AB
Specific Gravity, Urine: >1.03 — ABNORMAL HIGH (ref 1.005–1.030)
pH: 6 (ref 5.0–8.0)

## 2022-07-28 LAB — POCT PREGNANCY, URINE: Preg Test, Ur: POSITIVE — AB

## 2022-07-28 MED ORDER — LACTATED RINGERS IV BOLUS
1000.0000 mL | Freq: Once | INTRAVENOUS | Status: AC
  Administered 2022-07-28: 1000 mL via INTRAVENOUS

## 2022-07-28 MED ORDER — PROMETHAZINE HCL 25 MG PO TABS
25.0000 mg | ORAL_TABLET | Freq: Once | ORAL | Status: AC
  Administered 2022-07-28: 25 mg via ORAL
  Filled 2022-07-28: qty 1

## 2022-07-28 MED ORDER — ONDANSETRON 4 MG PO TBDP: 4.0000 mg | ORAL_TABLET | Freq: Four times a day (QID) | ORAL | 0 refills | Status: DC | PRN

## 2022-07-28 MED ORDER — SODIUM CHLORIDE 0.9 % IV SOLN
8.0000 mg | Freq: Once | INTRAVENOUS | Status: AC
  Administered 2022-07-28: 8 mg via INTRAVENOUS
  Filled 2022-07-28: qty 4

## 2022-07-28 MED ORDER — PROMETHAZINE HCL 25 MG PO TABS: 25.0000 mg | ORAL_TABLET | Freq: Four times a day (QID) | ORAL | 0 refills | Status: DC | PRN

## 2022-07-28 MED ORDER — PANTOPRAZOLE SODIUM 40 MG IV SOLR
40.0000 mg | Freq: Once | INTRAVENOUS | Status: AC
  Administered 2022-07-28: 40 mg via INTRAVENOUS
  Filled 2022-07-28: qty 10

## 2022-07-28 MED ORDER — ALUM & MAG HYDROXIDE-SIMETH 200-200-20 MG/5ML PO SUSP
30.0000 mL | Freq: Once | ORAL | Status: AC
  Administered 2022-07-28: 30 mL via ORAL
  Filled 2022-07-28 (×2): qty 30

## 2022-07-28 NOTE — MAU Provider Note (Signed)
History     [id number]  Arrival date and time: 07/28/22 0932    Chief Complaint  Patient presents with   Abdominal Pain   Emesis     HPI Mercedes [name] is a 31 y.o. at Unknown gestational age who presents for nausea, vomiting, and abdominel pain.   Patient reports several days of extreme nausea and vomiting Has not been able to keep down either food or water over the past few days Also had positive home pregnancy test recently Abdominal pain is in a band across both the upper abdomen and lower pelvis No vaginal bleeding This is an unplanned pregnancy The pregnancy is not desired She would like to get a termination but can not afford it     OB History     Gravida  7   Para  3   Term  3   Preterm  0   AB  3   Living  3      SAB  2   IAB  1   Ectopic  0   Multiple  0   Live Births  3        Obstetric Comments  G1: 2014 3430gm SVD G2: 01/2014 3195gm SVD/2nd         Past Medical History:  Diagnosis Date   Anemia    Asthma    Hypokalemia    Kidney stone 2018    Past Surgical History:  Procedure Laterality Date   NO PAST SURGERIES     WISDOM TOOTH EXTRACTION      Family History  Problem Relation Age of Onset   Cancer Mother        ovarian   Asthma Mother     Social History   Socioeconomic History   Marital status: Single    Spouse name: Not on file   Number of children: Not on file   Years of education: Not on file   Highest education level: Not on file  Occupational History   Not on file  Tobacco Use   Smoking status: Former    Types: Cigarettes    Quit date: 2021    Years since quitting: 2.7   Smokeless tobacco: Never   Tobacco comments:    with +preg test  Vaping Use   Vaping Use: Never used  Substance and Sexual Activity   Alcohol use: No   Drug use: Yes    Types: Marijuana    Comment: last used a week ago   Sexual activity: Yes    Birth control/protection: None    Comment: Patient wants tubes tied   Other Topics Concern   Not on file  Social History Narrative   Not on file   Social Determinants of Health   Financial Resource Strain: Not on file  Food Insecurity: Not on file  Transportation Needs: Not on file  Physical Activity: Not on file  Stress: Not on file  Social Connections: Not on file  Intimate Partner Violence: Not on file    Allergies  Allergen Reactions   Penicillins Other (See Comments)    Reaction:  Unknown  Has patient had a PCN reaction causing immediate rash, facial/tongue/throat swelling, SOB or lightheadedness with hypotension: Unknown Has patient had a PCN reaction causing severe rash involving mucus membranes or skin necrosis: Unknown Has patient had a PCN reaction that required hospitalization: Unknown Has patient had a PCN reaction occurring within the last 10 years: No If all of the above answers are "NO", then  may proceed with Cephalosporin use. Unknown reaction Childhood allergy    No current facility-administered medications on file prior to encounter.   Current Outpatient Medications on File Prior to Encounter  Medication Sig Dispense Refill   albuterol (PROVENTIL) (2.5 MG/3ML) 0.083% nebulizer solution Take 3 mLs (2.5 mg total) by nebulization every 6 (six) hours as needed for wheezing or shortness of breath. 75 mL 2   albuterol (VENTOLIN HFA) 108 (90 Base) MCG/ACT inhaler Inhale 1-2 puffs into the lungs every 6 (six) hours as needed for wheezing or shortness of breath. 1 each 1   chlorpheniramine-HYDROcodone 10-8 MG/5ML Take 5 mLs by mouth every 12 (twelve) hours as needed for cough. 70 mL 0   ondansetron (ZOFRAN) 4 MG tablet Take 1 tablet (4 mg total) by mouth daily as needed for nausea or vomiting. 15 tablet 0   predniSONE (DELTASONE) 20 MG tablet Take 3 tablets once daily for 3 days followed by 2 tablets once daily for 3 days followed by 1 tablet once daily for 3 days and then stop 18 tablet 0     ROS Pertinent positives and negative  per HPI, all others reviewed and negative  Physical Exam   BP (!) 120/51   Pulse 78   Temp 98.3 F (36.8 C) (Oral)   Resp 18   Ht 5\' 4"  (1.626 m)   Wt 103.9 kg   LMP  (LMP Unknown)   BMI 39.31 kg/m   Patient Vitals for the past 24 hrs:  BP Temp Temp src Pulse Resp Height Weight  07/28/22 1346 (!) 120/51 -- -- 78 -- -- --  07/28/22 0958 (!) 112/53 -- -- 84 -- -- --  07/28/22 0946 132/72 98.3 F (36.8 C) Oral 88 18 5\' 4"  (1.626 m) 103.9 kg    Physical Exam Vitals reviewed.  Constitutional:      General: She is not in acute distress.    Appearance: She is well-developed. She is not diaphoretic.  Eyes:     General: No scleral icterus. Pulmonary:     Effort: Pulmonary effort is normal. No respiratory distress.  Skin:    General: Skin is warm and dry.  Neurological:     Mental Status: She is alert.     Coordination: Coordination normal.      Cervical Exam    Bedside Ultrasound Pt informed that the ultrasound is considered a limited OB ultrasound and is not intended to be a complete ultrasound exam.  Patient also informed that the ultrasound is not being completed with the intent of assessing for fetal or placental anomalies or any pelvic abnormalities.  Explained that the purpose of today's ultrasound is to assess for  viability.  Patient acknowledges the purpose of the exam and the limitations of the study.    My interpretation: IUGS seen, poor resolution but likely yolk sac and fetal pole seen. Approximately 5-[redacted] weeks gestation by visual estimation.    Labs Results for orders placed or performed during the hospital encounter of 07/28/22 (from the past 24 hour(s))  Urinalysis, Routine w reflex microscopic Urine, Clean Catch     Status: Abnormal   Collection Time: 07/28/22  9:37 AM  Result Value Ref Range   Color, Urine YELLOW YELLOW   APPearance HAZY (A) CLEAR   Specific Gravity, Urine >1.030 (H) 1.005 - 1.030   pH 6.0 5.0 - 8.0   Glucose, UA NEGATIVE NEGATIVE  mg/dL   Hgb urine dipstick NEGATIVE NEGATIVE   Bilirubin Urine SMALL (A) NEGATIVE  Ketones, ur >80 (A) NEGATIVE mg/dL   Protein, ur 30 (A) NEGATIVE mg/dL   Nitrite NEGATIVE NEGATIVE   Leukocytes,Ua NEGATIVE NEGATIVE  Urinalysis, Microscopic (reflex)     Status: Abnormal   Collection Time: 07/28/22  9:37 AM  Result Value Ref Range   RBC / HPF 0-5 0 - 5 RBC/hpf   WBC, UA 0-5 0 - 5 WBC/hpf   Bacteria, UA RARE (A) NONE SEEN   Squamous Epithelial / LPF 0-5 0 - 5  Pregnancy, urine POC     Status: Abnormal   Collection Time: 07/28/22  9:46 AM  Result Value Ref Range   Preg Test, Ur POSITIVE (A) NEGATIVE  Comprehensive metabolic panel     Status: Abnormal   Collection Time: 07/28/22 11:07 AM  Result Value Ref Range   Sodium 137 135 - 145 mmol/L   Potassium 3.4 (L) 3.5 - 5.1 mmol/L   Chloride 104 98 - 111 mmol/L   CO2 21 (L) 22 - 32 mmol/L   Glucose, Bld 72 70 - 99 mg/dL   BUN 15 6 - 20 mg/dL   Creatinine, Ser 8.14 0.44 - 1.00 mg/dL   Calcium 9.2 8.9 - 48.1 mg/dL   Total Protein 7.5 6.5 - 8.1 g/dL   Albumin 3.9 3.5 - 5.0 g/dL   AST 15 15 - 41 U/L   ALT 12 0 - 44 U/L   Alkaline Phosphatase 71 38 - 126 U/L   Total Bilirubin 1.0 0.3 - 1.2 mg/dL   GFR, Estimated >85 >63 mL/min   Anion gap 12 5 - 15    Imaging No results found.  MAU Course  Procedures Lab Orders         Urinalysis, Routine w reflex microscopic Urine, Clean Catch         Comprehensive metabolic panel         Urinalysis, Microscopic (reflex)         Pregnancy, urine POC    Meds ordered this encounter  Medications   lactated ringers bolus 1,000 mL   ondansetron (ZOFRAN) 8 mg in sodium chloride 0.9 % 50 mL IVPB   promethazine (PHENERGAN) tablet 25 mg   pantoprazole (PROTONIX) injection 40 mg   alum & mag hydroxide-simeth (MAALOX/MYLANTA) 200-200-20 MG/5ML suspension 30 mL   lactated ringers bolus 1,000 mL   ondansetron (ZOFRAN-ODT) 4 MG disintegrating tablet    Sig: Take 1 tablet (4 mg total) by mouth every 6  (six) hours as needed for nausea.    Dispense:  20 tablet    Refill:  0   promethazine (PHENERGAN) 25 MG tablet    Sig: Take 1 tablet (25 mg total) by mouth every 6 (six) hours as needed for nausea or vomiting.    Dispense:  30 tablet    Refill:  0   Imaging Orders  No imaging studies ordered today    MDM moderate  Assessment and Plan  #Abdominal pain in pregnancy, first trimester #Nausea and vomiting of pregnancy #Unwanted pregnancy #5-[redacted] weeks gestation of pregnancy Low suspicion for ectopic with bedside US showing IUGS and likely yolk sac and fetal pole. Patient strongly desires termination but is struggling financially as well as with transportation. Directed towards resources to help with both of these things, she reports she has an appointment on 08/08/2022. After termination she strongly desires a BTL. Will send message to have her be scheduled for gyn consult in early November. With regards to nausea and abdominal, patient given the above meds with good  effect. Labs were unremarkable. Rx sent for home prescriptions.     Dispo: discharged to home in stable condition.   Clarnce Flock, MD/MPH 07/28/22 1:49 PM  Allergies as of 07/28/2022       Reactions   Penicillins Other (See Comments)   Reaction:  Unknown  Has patient had a PCN reaction causing immediate rash, facial/tongue/throat swelling, SOB or lightheadedness with hypotension: Unknown Has patient had a PCN reaction causing severe rash involving mucus membranes or skin necrosis: Unknown Has patient had a PCN reaction that required hospitalization: Unknown Has patient had a PCN reaction occurring within the last 10 years: No If all of the above answers are "NO", then may proceed with Cephalosporin use. Unknown reaction Childhood allergy        Medication List     STOP taking these medications    albuterol (2.5 MG/3ML) 0.083% nebulizer solution Commonly known as: PROVENTIL   albuterol 108 (90 Base)  MCG/ACT inhaler Commonly known as: VENTOLIN HFA   chlorpheniramine-HYDROcodone 10-8 MG/5ML Commonly known as: TUSSIONEX   ondansetron 4 MG tablet Commonly known as: Zofran   predniSONE 20 MG tablet Commonly known as: DELTASONE       TAKE these medications    ondansetron 4 MG disintegrating tablet Commonly known as: ZOFRAN-ODT Take 1 tablet (4 mg total) by mouth every 6 (six) hours as needed for nausea.   promethazine 25 MG tablet Commonly known as: PHENERGAN Take 1 tablet (25 mg total) by mouth every 6 (six) hours as needed for nausea or vomiting.

## 2022-07-28 NOTE — MAU Note (Signed)
.  Mercedes [name] is a 31 y.o. at Unknown here in MAU reporting: mid/lower abd pain and cramping that is now radiating  down her legs. Had positve HPT last week. Denies any vag bleeding or discharge a this time. Also c/o n/v  vomited 6 times today.  LMP: unsure Onset of complaint: 3days Pain score: 10 Vitals:   07/28/22 0946  BP: 132/72  Pulse: 88  Resp: 18  Temp: 98.3 F (36.8 C)     FHT: Lab orders placed from triage:  UPT/ U/A

## 2022-08-02 ENCOUNTER — Inpatient Hospital Stay (HOSPITAL_COMMUNITY)
Admission: AD | Admit: 2022-08-02 | Discharge: 2022-08-02 | Disposition: A | Discharge status: Home / Self Care | Payer: Medicaid Other | Attending: Obstetrics & Gynecology | Admitting: Obstetrics & Gynecology

## 2022-08-02 ENCOUNTER — Encounter (HOSPITAL_COMMUNITY): Payer: Self-pay | Admitting: Obstetrics & Gynecology

## 2022-08-02 DIAGNOSIS — Z3A00 Weeks of gestation of pregnancy not specified: Secondary | ICD-10-CM | POA: Diagnosis not present

## 2022-08-02 DIAGNOSIS — R1110 Vomiting, unspecified: Secondary | ICD-10-CM | POA: Diagnosis present

## 2022-08-02 DIAGNOSIS — F12988 Cannabis use, unspecified with other cannabis-induced disorder: Secondary | ICD-10-CM | POA: Diagnosis not present

## 2022-08-02 DIAGNOSIS — F12188 Cannabis abuse with other cannabis-induced disorder: Secondary | ICD-10-CM

## 2022-08-02 DIAGNOSIS — R109 Unspecified abdominal pain: Secondary | ICD-10-CM | POA: Diagnosis present

## 2022-08-02 DIAGNOSIS — O26891 Other specified pregnancy related conditions, first trimester: Secondary | ICD-10-CM | POA: Diagnosis present

## 2022-08-02 DIAGNOSIS — R824 Acetonuria: Secondary | ICD-10-CM | POA: Insufficient documentation

## 2022-08-02 DIAGNOSIS — Z640 Problems related to unwanted pregnancy: Secondary | ICD-10-CM | POA: Diagnosis not present

## 2022-08-02 LAB — URINALYSIS, ROUTINE W REFLEX MICROSCOPIC
Bacteria, UA: NONE SEEN
Bilirubin Urine: NEGATIVE
Glucose, UA: NEGATIVE mg/dL
Hgb urine dipstick: NEGATIVE
Ketones, ur: 80 mg/dL — AB
Leukocytes,Ua: NEGATIVE
Nitrite: NEGATIVE
Protein, ur: 100 mg/dL — AB
Specific Gravity, Urine: 1.032 — ABNORMAL HIGH (ref 1.005–1.030)
pH: 5 (ref 5.0–8.0)

## 2022-08-02 MED ORDER — HALOPERIDOL LACTATE 5 MG/ML IJ SOLN
5.0000 mg | Freq: Once | INTRAMUSCULAR | Status: AC
Start: 2022-08-02 — End: 2022-08-02
  Administered 2022-08-02: 5 mg via INTRAVENOUS
  Filled 2022-08-02: qty 1

## 2022-08-02 MED ORDER — LACTATED RINGERS IV SOLN
Freq: Once | INTRAVENOUS | Status: DC
Start: 2022-08-02 — End: 2022-08-02

## 2022-08-02 MED ORDER — DIPHENHYDRAMINE HCL 50 MG/ML IJ SOLN
25.0000 mg | Freq: Once | INTRAMUSCULAR | Status: AC
  Administered 2022-08-02: 25 mg via INTRAVENOUS
  Filled 2022-08-02: qty 1

## 2022-08-02 MED ORDER — M.V.I. ADULT IV INJ
Freq: Once | INTRAVENOUS | Status: DC
  Filled 2022-08-02: qty 10

## 2022-08-02 MED ORDER — LACTATED RINGERS IV BOLUS
1000.0000 mL | Freq: Once | INTRAVENOUS | Status: AC
Start: 2022-08-02 — End: 2022-08-02
  Administered 2022-08-02: 1000 mL via INTRAVENOUS

## 2022-08-02 NOTE — MAU Provider Note (Signed)
History     CSN: [id number]  Arrival date and time: 08/02/22 1610   Event Date/Time   First Provider Initiated Contact with Patient 08/02/22 1645      Chief Complaint  Patient presents with   Abdominal Pain   Leg Pain   Nausea   Emesis   HPI Mercedes [name] is a 31 y.V.[id number] in early pregnancy who presents to MAU with chief complaint of vomiting, abdominal pain and bilateral hamstring pain. She has vomited more than 10 times today. She endorses THC use within the past two weeks and is amenable to being treated for Cannabis Hyperemesis.   She denies vaginal bleeding, dysuria, constipation, fever or recent illness. Patient is tearful on arrival to MAU due to pervasive vomiting.  Patient states she has an appointment for elective termination on Monday 08/08/2022.   OB History     Gravida  7   Para  3   Term  3   Preterm  0   AB  3   Living  3      SAB  2   IAB  1   Ectopic  0   Multiple  0   Live Births  3        Obstetric Comments  G1: 2014 3430gm SVD G2: 01/2014 3195gm SVD/2nd         Past Medical History:  Diagnosis Date   Anemia    Asthma    Hypokalemia    Kidney stone 2018    Past Surgical History:  Procedure Laterality Date   NO PAST SURGERIES     WISDOM TOOTH EXTRACTION      Family History  Problem Relation Age of Onset   Cancer Mother        ovarian   Asthma Mother     Social History   Tobacco Use   Smoking status: Former    Types: Cigarettes    Quit date: 2021    Years since quitting: 2.7   Smokeless tobacco: Never   Tobacco comments:    with +preg test  Vaping Use   Vaping Use: Never used  Substance Use Topics   Alcohol use: No   Drug use: Not Currently    Types: Marijuana    Comment: last used a week ago    Allergies:  Allergies  Allergen Reactions   Penicillins Other (See Comments)    Reaction:  Unknown  Has patient had a PCN reaction causing immediate rash, facial/tongue/throat swelling, SOB or  lightheadedness with hypotension: Unknown Has patient had a PCN reaction causing severe rash involving mucus membranes or skin necrosis: Unknown Has patient had a PCN reaction that required hospitalization: Unknown Has patient had a PCN reaction occurring within the last 10 years: No If all of the above answers are "NO", then may proceed with Cephalosporin use. Unknown reaction Childhood allergy    Medications Prior to Admission  Medication Sig Dispense Refill Last Dose   ondansetron (ZOFRAN-ODT) 4 MG disintegrating tablet Take 1 tablet (4 mg total) by mouth every 6 (six) hours as needed for nausea. 20 tablet 0 08/02/2022 at 1000   promethazine (PHENERGAN) 25 MG tablet Take 1 tablet (25 mg total) by mouth every 6 (six) hours as needed for nausea or vomiting. 30 tablet 0 Past Week    Review of Systems  Genitourinary:  Positive for vaginal bleeding.  All other systems reviewed and are negative.  Physical Exam   Blood pressure 132/77, pulse 72, height 5\' 4"  (1.626  m), weight 101.7 kg, SpO2 99 %, unknown if currently breastfeeding.  Physical Exam Vitals and nursing note reviewed. Exam conducted with a chaperone present.  Constitutional:      Appearance: She is well-developed.  Cardiovascular:     Rate and Rhythm: Normal rate.  Pulmonary:     Effort: Pulmonary effort is normal.  Abdominal:     Palpations: Abdomen is soft.     Tenderness: There is no abdominal tenderness.  Skin:    Capillary Refill: Capillary refill takes less than 2 seconds.  Neurological:     Mental Status: She is alert and oriented to person, place, and time.  Psychiatric:        Mood and Affect: Mood normal.        Behavior: Behavior normal.     MAU Course  Procedures  MDM   -Patient pushed call bell at 1738 to notify team she was feeling better and that her brother had been shot and she needed immediate discharge  Patient Vitals for the past 24 hrs:  BP Pulse SpO2 Height Weight  08/02/22 1643  132/77 72 -- -- --  08/02/22 1635 136/86 79 99 % -- --  08/02/22 0700 -- -- -- 5\' 4"  (1.626 m) 101.7 kg   Results for orders placed or performed during the hospital encounter of 08/02/22 (from the past 24 hour(s))  Urinalysis, Routine w reflex microscopic Urine, Clean Catch     Status: Abnormal   Collection Time: 08/02/22  4:44 PM  Result Value Ref Range   Color, Urine AMBER (A) YELLOW   APPearance CLOUDY (A) CLEAR   Specific Gravity, Urine 1.032 (H) 1.005 - 1.030   pH 5.0 5.0 - 8.0   Glucose, UA NEGATIVE NEGATIVE mg/dL   Hgb urine dipstick NEGATIVE NEGATIVE   Bilirubin Urine NEGATIVE NEGATIVE   Ketones, ur 80 (A) NEGATIVE mg/dL   Protein, ur 10/02/22 (A) NEGATIVE mg/dL   Nitrite NEGATIVE NEGATIVE   Leukocytes,Ua NEGATIVE NEGATIVE   RBC / HPF 0-5 0 - 5 RBC/hpf   WBC, UA 0-5 0 - 5 WBC/hpf   Bacteria, UA NONE SEEN NONE SEEN   Squamous Epithelial / LPF 6-10 0 - 5   Mucus PRESENT    Meds ordered this encounter  Medications   diphenhydrAMINE (BENADRYL) injection 25 mg   haloperidol lactate (HALDOL) injection 5 mg   DISCONTD: lactated ringers infusion   lactated ringers bolus 1,000 mL   DISCONTD: M.V.I. Adult (INFUVITE ADULT) 10 mL in lactated ringers 1,000 mL infusion   Assessment and Plan  --31 y.o. 26 in first trimester (see encounter note from Dr. [id number]) --Cannabis Hyperemesis 2/2 THC use --Ketonuria, IV repletion initiated --Discharge home before completion of treatment due to family emergency  F/U: --Patient encouraged to return to MAU for completion of IV fluids  Crissie Reese, MSA, MSN, CNM 08/02/2022, 7:27 PM

## 2022-08-02 NOTE — MAU Note (Addendum)
...  Mercedes [name] is a 31 y.o. at Unknown here in MAU via EMS reporting: N/V, spotting, intermittent lower abdominal pain, and bilateral hamstring pain. She reports her N/V returned 1-2 days after being discharged from MAU on 10/5. She reports she also began experiencing intermittent mid lower abdominal pain around the same time frame and states the pain is intermittent and rates this 10/10. She is also reporting one episode of vaginal spotting this morning around 1100. She is also endorsing bilateral hamstring pain that began yesterday. Denies recent IC.   Last Doses: Zofran 1000 this morning. She states she takes it q6h. Phenergan: Three days ago. She reports abdominal discomfort with this drug.  Per Dr. Dione Plover bedside U/S from 10/5: "IUGS seen, poor resolution but likely yolk sac and fetal pole seen. Approximately 5-[redacted] weeks gestation by visual estimation."  Pain score:  10/10 mid lower abdomen 7/10 bilateral hamstring  Lab orders placed from triage:  UA

## 2022-08-02 NOTE — MAU Note (Signed)
Patient came to nurses station at approximately 1735 reporting that she had a family emergency and needed to leave immediately. RN removed patient's IV and stopped her fluids. CNM notified and discharge paperwork uploaded. RN left patient room to print off discharge AVS and patient was not in the room upon return at 1742.

## 2022-08-25 ENCOUNTER — Ambulatory Visit: Payer: Medicaid Other | Admitting: Obstetrics and Gynecology

## 2023-06-16 ENCOUNTER — Emergency Department (HOSPITAL_COMMUNITY): Payer: Medicaid Other

## 2023-06-16 ENCOUNTER — Observation Stay (HOSPITAL_COMMUNITY): Payer: Medicaid Other

## 2023-06-16 ENCOUNTER — Inpatient Hospital Stay (HOSPITAL_COMMUNITY)
Admission: EM | Admit: 2023-06-16 | Discharge: 2023-06-21 | DRG: 832 | Disposition: A | Discharge status: Home / Self Care | Payer: Medicaid Other | Attending: Internal Medicine | Admitting: Internal Medicine

## 2023-06-16 ENCOUNTER — Encounter (HOSPITAL_COMMUNITY): Payer: Self-pay

## 2023-06-16 ENCOUNTER — Other Ambulatory Visit: Payer: Self-pay

## 2023-06-16 DIAGNOSIS — O99891 Other specified diseases and conditions complicating pregnancy: Secondary | ICD-10-CM | POA: Diagnosis not present

## 2023-06-16 DIAGNOSIS — E860 Dehydration: Secondary | ICD-10-CM | POA: Diagnosis present

## 2023-06-16 DIAGNOSIS — R1110 Vomiting, unspecified: Secondary | ICD-10-CM

## 2023-06-16 DIAGNOSIS — R569 Unspecified convulsions: Secondary | ICD-10-CM | POA: Diagnosis not present

## 2023-06-16 DIAGNOSIS — O99320 Drug use complicating pregnancy, unspecified trimester: Secondary | ICD-10-CM

## 2023-06-16 DIAGNOSIS — O211 Hyperemesis gravidarum with metabolic disturbance: Secondary | ICD-10-CM | POA: Diagnosis not present

## 2023-06-16 DIAGNOSIS — Z825 Family history of asthma and other chronic lower respiratory diseases: Secondary | ICD-10-CM

## 2023-06-16 DIAGNOSIS — O99321 Drug use complicating pregnancy, first trimester: Secondary | ICD-10-CM | POA: Diagnosis present

## 2023-06-16 DIAGNOSIS — Z87442 Personal history of urinary calculi: Secondary | ICD-10-CM

## 2023-06-16 DIAGNOSIS — Z3A00 Weeks of gestation of pregnancy not specified: Secondary | ICD-10-CM

## 2023-06-16 DIAGNOSIS — O99351 Diseases of the nervous system complicating pregnancy, first trimester: Principal | ICD-10-CM | POA: Diagnosis present

## 2023-06-16 DIAGNOSIS — Z87891 Personal history of nicotine dependence: Secondary | ICD-10-CM

## 2023-06-16 DIAGNOSIS — F1290 Cannabis use, unspecified, uncomplicated: Secondary | ICD-10-CM | POA: Diagnosis present

## 2023-06-16 DIAGNOSIS — O99281 Endocrine, nutritional and metabolic diseases complicating pregnancy, first trimester: Secondary | ICD-10-CM | POA: Diagnosis present

## 2023-06-16 DIAGNOSIS — O208 Other hemorrhage in early pregnancy: Secondary | ICD-10-CM | POA: Diagnosis present

## 2023-06-16 DIAGNOSIS — R32 Unspecified urinary incontinence: Secondary | ICD-10-CM | POA: Diagnosis present

## 2023-06-16 DIAGNOSIS — O99111 Other diseases of the blood and blood-forming organs and certain disorders involving the immune mechanism complicating pregnancy, first trimester: Secondary | ICD-10-CM | POA: Diagnosis present

## 2023-06-16 DIAGNOSIS — Z3A01 Less than 8 weeks gestation of pregnancy: Secondary | ICD-10-CM | POA: Diagnosis not present

## 2023-06-16 DIAGNOSIS — D72829 Elevated white blood cell count, unspecified: Secondary | ICD-10-CM | POA: Diagnosis present

## 2023-06-16 DIAGNOSIS — R8271 Bacteriuria: Secondary | ICD-10-CM | POA: Diagnosis present

## 2023-06-16 DIAGNOSIS — Z880 Allergy status to penicillin: Secondary | ICD-10-CM

## 2023-06-16 DIAGNOSIS — F17210 Nicotine dependence, cigarettes, uncomplicated: Secondary | ICD-10-CM

## 2023-06-16 DIAGNOSIS — O99330 Smoking (tobacco) complicating pregnancy, unspecified trimester: Secondary | ICD-10-CM

## 2023-06-16 LAB — CBC WITH DIFFERENTIAL/PLATELET
Abs Immature Granulocytes: 0.03 10*3/uL (ref 0.00–0.07)
Basophils Absolute: 0 10*3/uL (ref 0.0–0.1)
Basophils Relative: 0 %
Eosinophils Absolute: 0 10*3/uL (ref 0.0–0.5)
Eosinophils Relative: 0 %
HCT: 41.4 % (ref 36.0–46.0)
Hemoglobin: 13.7 g/dL (ref 12.0–15.0)
Immature Granulocytes: 0 %
Lymphocytes Relative: 14 %
Lymphs Abs: 1.5 10*3/uL (ref 0.7–4.0)
MCH: 29 pg (ref 26.0–34.0)
MCHC: 33.1 g/dL (ref 30.0–36.0)
MCV: 87.7 fL (ref 80.0–100.0)
Monocytes Absolute: 0.9 10*3/uL (ref 0.1–1.0)
Monocytes Relative: 8 %
Neutro Abs: 8.7 10*3/uL — ABNORMAL HIGH (ref 1.7–7.7)
Neutrophils Relative %: 78 %
Platelets: 213 10*3/uL (ref 150–400)
RBC: 4.72 MIL/uL (ref 3.87–5.11)
RDW: 12.8 % (ref 11.5–15.5)
WBC: 11.1 10*3/uL — ABNORMAL HIGH (ref 4.0–10.5)
nRBC: 0 % (ref 0.0–0.2)

## 2023-06-16 LAB — RAPID URINE DRUG SCREEN, HOSP PERFORMED
Amphetamines: NOT DETECTED
Barbiturates: NOT DETECTED
Benzodiazepines: NOT DETECTED
Cocaine: NOT DETECTED
Opiates: NOT DETECTED
Tetrahydrocannabinol: POSITIVE — AB

## 2023-06-16 LAB — COMPREHENSIVE METABOLIC PANEL
ALT: 12 U/L (ref 0–44)
AST: 16 U/L (ref 15–41)
Albumin: 4 g/dL (ref 3.5–5.0)
Alkaline Phosphatase: 84 U/L (ref 38–126)
Anion gap: 15 (ref 5–15)
BUN: 15 mg/dL (ref 6–20)
CO2: 25 mmol/L (ref 22–32)
Calcium: 9.3 mg/dL (ref 8.9–10.3)
Chloride: 96 mmol/L — ABNORMAL LOW (ref 98–111)
Creatinine, Ser: 0.78 mg/dL (ref 0.44–1.00)
GFR, Estimated: >60 mL/min (ref >60)
Glucose, Bld: 99 mg/dL (ref 70–99)
Potassium: 3.1 mmol/L — ABNORMAL LOW (ref 3.5–5.1)
Sodium: 136 mmol/L (ref 135–145)
Total Bilirubin: 1.4 mg/dL — ABNORMAL HIGH (ref 0.3–1.2)
Total Protein: 8.1 g/dL (ref 6.5–8.1)

## 2023-06-16 LAB — TSH: TSH: 0.254 u[IU]/mL — ABNORMAL LOW (ref 0.350–4.500)

## 2023-06-16 LAB — URINALYSIS, ROUTINE W REFLEX MICROSCOPIC
Glucose, UA: NEGATIVE mg/dL
Hgb urine dipstick: NEGATIVE
Ketones, ur: 80 mg/dL — AB
Leukocytes,Ua: NEGATIVE
Nitrite: NEGATIVE
Protein, ur: 100 mg/dL — AB
Specific Gravity, Urine: 1.032 — ABNORMAL HIGH (ref 1.005–1.030)
pH: 5 (ref 5.0–8.0)

## 2023-06-16 LAB — CBG MONITORING, ED: Glucose-Capillary: 94 mg/dL (ref 70–99)

## 2023-06-16 LAB — MAGNESIUM: Magnesium: 2.4 mg/dL (ref 1.7–2.4)

## 2023-06-16 LAB — CK: Total CK: 22 U/L — ABNORMAL LOW (ref 38–234)

## 2023-06-16 LAB — HCG, QUANTITATIVE, PREGNANCY: hCG, Beta Chain, Quant, S: 53545 m[IU]/mL — ABNORMAL HIGH (ref <5)

## 2023-06-16 MED ORDER — POLYETHYLENE GLYCOL 3350 17 G PO PACK: 17.0000 g | PACK | Freq: Every day | ORAL | Status: DC | PRN

## 2023-06-16 MED ORDER — POTASSIUM CHLORIDE CRYS ER 20 MEQ PO TBCR
40.0000 meq | EXTENDED_RELEASE_TABLET | Freq: Two times a day (BID) | ORAL | Status: AC
  Administered 2023-06-16 (×2): 40 meq via ORAL
  Filled 2023-06-16 (×2): qty 2

## 2023-06-16 MED ORDER — SODIUM CHLORIDE 0.9 % IV BOLUS
1000.0000 mL | Freq: Once | INTRAVENOUS | Status: AC
  Administered 2023-06-16: 1000 mL via INTRAVENOUS

## 2023-06-16 MED ORDER — ONDANSETRON HCL 4 MG/2ML IJ SOLN
4.0000 mg | Freq: Three times a day (TID) | INTRAMUSCULAR | Status: DC | PRN
  Administered 2023-06-16 – 2023-06-21 (×9): 4 mg via INTRAVENOUS
  Filled 2023-06-16 (×9): qty 2

## 2023-06-16 MED ORDER — ONDANSETRON HCL 4 MG/2ML IJ SOLN
4.0000 mg | Freq: Once | INTRAMUSCULAR | Status: AC
  Administered 2023-06-16: 4 mg via INTRAVENOUS
  Filled 2023-06-16: qty 2

## 2023-06-16 MED ORDER — FAMOTIDINE IN NACL 20-0.9 MG/50ML-% IV SOLN
20.0000 mg | Freq: Once | INTRAVENOUS | Status: AC
  Administered 2023-06-16: 20 mg via INTRAVENOUS
  Filled 2023-06-16: qty 50

## 2023-06-16 MED ORDER — SODIUM CHLORIDE 0.9 % IV SOLN: INTRAVENOUS | Status: DC

## 2023-06-16 MED ORDER — FOLIC ACID 1 MG PO TABS
1.0000 mg | ORAL_TABLET | Freq: Every day | ORAL | Status: DC
  Administered 2023-06-16 – 2023-06-21 (×6): 1 mg via ORAL
  Filled 2023-06-16 (×6): qty 1

## 2023-06-16 MED ORDER — ACETAMINOPHEN 325 MG PO TABS
650.0000 mg | ORAL_TABLET | Freq: Three times a day (TID) | ORAL | Status: DC
  Administered 2023-06-16 – 2023-06-21 (×14): 650 mg via ORAL
  Filled 2023-06-16 (×14): qty 2

## 2023-06-16 MED ORDER — LACTATED RINGERS IV SOLN: INTRAVENOUS | Status: AC

## 2023-06-16 MED ORDER — SODIUM CHLORIDE 0.9 % IV SOLN
12.5000 mg | Freq: Once | INTRAVENOUS | Status: AC
  Administered 2023-06-16: 12.5 mg via INTRAVENOUS
  Filled 2023-06-16: qty 12.5

## 2023-06-16 MED ORDER — ENOXAPARIN SODIUM 40 MG/0.4ML IJ SOSY: 40.0000 mg | PREFILLED_SYRINGE | INTRAMUSCULAR | Status: DC

## 2023-06-16 MED ORDER — SODIUM CHLORIDE 0.9 % IV SOLN
12.5000 mg | Freq: Once | INTRAVENOUS | Status: AC
  Administered 2023-06-16: 12.5 mg via INTRAVENOUS
  Filled 2023-06-16: qty 0.5

## 2023-06-16 NOTE — Progress Notes (Signed)
LTM EEG hooked up and running - no initial skin breakdown - push button tested - [facility] monitoring.  

## 2023-06-16 NOTE — Consult Note (Addendum)
NEURO HOSPITALIST CONSULT NOTE   Requestig physician: Dr. Adela Lank  Reason for Consult: New onset seizures during pregnancy  History obtained from:  Patient, Husband and Chart     HPI:                                                                                                                                          Mercedes [name] is a 32 y.o. pregnant female, current regular marijuana smoker with a PMHx of anemia, asthma, cannabinoid hyperemesis syndrome, hypokalemia and nephrolithiasis, who presents to the ED with new onset seizures. Her husband was not at the bedside initially, but later became available for interview and corroborated the information below.   The patient states she woke up to her husband concerned about her this morning. She states, he told her, he woke up to the bed shaking and then she was not able to respond to him and he called EMS. She does not remember any events. No incontinence of bowel, bladder or tongue biting. Per RN at the bedside, she had another seizure-like spell about 1 hour ago. The nurse was not present when it started but arrived after start. RN stated that her whole body was shaking (small shaking movements), head was midline, eyes were midline and back was stiff lasting about 30 seconds. After episode she could not speak for about a minute and then was back to baseline. No incontinence of bladder or bowel, no tongue biting. Patient does not remember any events of this episode. She denies  any prodromal events leading up to the event. Never has had anything like this happen before. On review of telemetry and vital signs, all remained stable with no fluctuations. She endorses being ill with nausea and vomiting for the last few days due to new pregnancy and has not had any sleep in the last 3 days. She states she has had a few hours sleep total in the last 3 days. She does endorse smoking marijuana during her pregnancy. UDS + THC. She also  endorses being under more stress than normal.   Husband also notes that her head moved from side to side as all of her limbs became stiff and extended with tremoring movements during the seizure.   On exam she has no focal neurological symptoms. She is awake and alert and oriented x 4, PERRL, visual fields full, 5/5 strength in all 4 extremities. No ataxia or sensation deficits.   Past Medical History:  Diagnosis Date   Anemia    Asthma    Hypokalemia    Kidney stone 2018    Past Surgical History:  Procedure Laterality Date   NO PAST SURGERIES     WISDOM TOOTH EXTRACTION      Family History  Problem Relation Age  of Onset   Cancer Mother        ovarian   Asthma Mother              Social History:  reports that she quit smoking about 3 years ago. Her smoking use included cigarettes. She has never used smokeless tobacco. She reports that she does not currently use drugs after having used the following drugs: Marijuana. She reports that she does not drink alcohol.  Allergies  Allergen Reactions   Penicillins Other (See Comments)    Reaction:  Unknown  Has patient had a PCN reaction causing immediate rash, facial/tongue/throat swelling, SOB or lightheadedness with hypotension: Unknown Has patient had a PCN reaction causing severe rash involving mucus membranes or skin necrosis: Unknown Has patient had a PCN reaction that required hospitalization: Unknown Has patient had a PCN reaction occurring within the last 10 years: No If all of the above answers are "NO", then may proceed with Cephalosporin use. Unknown reaction Childhood allergy    MEDICATIONS:                                                                                                                     No current facility-administered medications on file prior to encounter.   Current Outpatient Medications on File Prior to Encounter  Medication Sig Dispense Refill   ondansetron (ZOFRAN-ODT) 4 MG disintegrating  tablet Take 1 tablet (4 mg total) by mouth every 6 (six) hours as needed for nausea. (Patient not taking: Reported on 06/16/2023) 20 tablet 0   promethazine (PHENERGAN) 25 MG tablet Take 1 tablet (25 mg total) by mouth every 6 (six) hours as needed for nausea or vomiting. (Patient not taking: Reported on 06/16/2023) 30 tablet 0     ROS:                                                                                                                                       History obtained from the patient  General ROS: positive for fatigue; negative for - chills, fever, night sweats, weight gain or weight loss Psychological ROS: negative for - behavioral disorder, hallucinations, memory difficulties, mood swings or suicidal ideation Ophthalmic ROS: negative for - blurry vision, double vision, eye pain or loss of vision ENT ROS: negative for - epistaxis, nasal discharge, oral lesions, sore throat, tinnitus or vertigo Allergy and Immunology ROS: negative for - hives or itchy/watery eyes  Hematological and Lymphatic ROS: negative for - bleeding problems, bruising or swollen lymph nodes Endocrine ROS: negative for - galactorrhea, hair pattern changes, polydipsia/polyuria or temperature intolerance Respiratory ROS: negative for - cough, hemoptysis, shortness of breath or wheezing Cardiovascular ROS: negative for - chest pain, dyspnea on exertion, edema or irregular heartbeat Gastrointestinal ROS: positive for nausea/vomiting; negative for - abdominal pain, diarrhea, hematemesis, or stool incontinence Genito-Urinary ROS: negative for - dysuria, hematuria, incontinence or urinary frequency/urgency Musculoskeletal ROS: negative for - joint swelling or muscular weakness Neurological ROS: as noted in HPI Dermatological ROS: negative for rash and skin lesion changes   Blood pressure 129/73, pulse 66, temperature 99.4 F (37.4 C), temperature source Axillary, resp. rate 20, height 5\' 4"  (1.626 m), weight 111.1  kg, SpO2 100%, unknown if currently breastfeeding.   General Examination:                                                                                                       Physical Exam  HEENT-  Normocephalic, no lesions, without obvious abnormality.  Normal external eye and conjunctiva.   Cardiovascular- S1-S2 audible, pulses palpable throughout   Lungs-no rhonchi or wheezing noted, no excessive working breathing.  Saturations within normal limits Abdomen- All 4 quadrants palpated and nontender Extremities- Warm, dry and intact Musculoskeletal-no joint tenderness, deformity or swelling Skin-warm and dry, no hyperpigmentation, vitiligo, or suspicious lesions  Neurological Examination Mental Status: Alert, oriented, thought content appropriate.  Speech fluent without evidence of aphasia.  Able to follow all commands without difficulty. Cranial Nerves: II: ; Visual fields grossly normal. PERRL  III,IV, VI: EOMI. No ptosis.  V,VII: smile symmetric, facial light touch sensation normal bilaterally VIII: Hearing intact to conversation IX,X: Phonation intact.  XI: Symmetric XII: midline tongue extension Motor: Right : Upper extremity   5/5    Left:     Upper extremity   5/5  Lower extremity   5/5     Lower extremity   5/5 Normal tone throughout; no atrophy noted Sensory: Light touch intact throughout, bilaterally Cerebellar: Normal FNF, RAM and H-S bilaterally Gait: Normal gait and station   Lab Results: Basic Metabolic Panel: Recent Labs  Lab 06/16/23 0844  NA 136  K 3.1*  CL 96*  CO2 25  GLUCOSE 99  BUN 15  CREATININE 0.78  CALCIUM 9.3  MG 2.4    CBC: Recent Labs  Lab 06/16/23 0844  WBC 11.1*  NEUTROABS 8.7*  HGB 13.7  HCT 41.4  MCV 87.7  PLT 213    Cardiac Enzymes: No results for input(s): "CKTOTAL", "CKMB", "CKMBINDEX", "TROPONINI" in the last 168 hours.  Lipid Panel: No results for input(s): "CHOL", "TRIG", "HDL", "CHOLHDL", "VLDL", "LDLCALC" in the  last 168 hours.  Imaging: US OB Comp < 14 Wks  Result Date: 06/16/2023 CLINICAL DATA:  Pelvic pain in 1st trimester pregnancy. EXAM: OBSTETRIC <14 WK [place] AND TRANSVAGINAL OB US TECHNIQUE: Both transabdominal and transvaginal ultrasound examinations were performed for complete evaluation of the gestation as well as the maternal uterus, adnexal regions, and pelvic cul-de-sac. Transvaginal technique was  performed to assess early pregnancy. COMPARISON:  None Available. FINDINGS: Intrauterine gestational sac: Single Yolk sac:  Visualized. Embryo:  Visualized. Cardiac Activity: Visualized. Heart Rate: 191 bpm CRL:  10 mm   7 w   0 d                  [place] EDC: 02/02/2024 Subchorionic hemorrhage:  Small subchorionic hemorrhage noted. Maternal uterus/adnexae: 3.2 cm simple left ovarian cyst noted. Normal appearance of right ovary. No suspicious adnexal mass or abnormal free fluid identified. IMPRESSION: Single living IUP with estimated gestational age of [redacted] weeks 0 days, and [place] EDC of 02/02/2024. Small subchorionic hemorrhage. Electronically Signed   By: Danae Orleans M.D.   On: 06/16/2023 16:41   US OB Transvaginal  Result Date: 06/16/2023 CLINICAL DATA:  Pelvic pain in 1st trimester pregnancy. EXAM: OBSTETRIC <14 WK [place] AND TRANSVAGINAL OB US TECHNIQUE: Both transabdominal and transvaginal ultrasound examinations were performed for complete evaluation of the gestation as well as the maternal uterus, adnexal regions, and pelvic cul-de-sac. Transvaginal technique was performed to assess early pregnancy. COMPARISON:  None Available. FINDINGS: Intrauterine gestational sac: Single Yolk sac:  Visualized. Embryo:  Visualized. Cardiac Activity: Visualized. Heart Rate: 191 bpm CRL:  10 mm   7 w   0 d                  [place] EDC: 02/02/2024 Subchorionic hemorrhage:  Small subchorionic hemorrhage noted. Maternal uterus/adnexae: 3.2 cm simple left ovarian cyst noted. Normal appearance of right ovary. No suspicious adnexal mass or  abnormal free fluid identified. IMPRESSION: Single living IUP with estimated gestational age of [redacted] weeks 0 days, and [place] EDC of 02/02/2024. Small subchorionic hemorrhage. Electronically Signed   By: Danae Orleans M.D.   On: 06/16/2023 16:41     Assessment: 32 y.o. pregnant female, current regular marijuana smoker, with a PMHx of anemia, asthma, cannabinoid hyperemesis syndrome, hypokalemia and nephrolithiasis, who presents to the ED with new onset seizures.  - Exam reveals no focal neurological deficit. Awake and alert and oriented x 4, PERRL, visual fields full, 5/5 strength in all 4 extremities. No ataxia or sensation deficits.  - Transvaginal obstetric ultrasound: Single living IUP with estimated gestational age of [redacted] weeks 0 days - Labs: - THC+ on UDS - Na, Ca and Mg normal - Glucose normal - Hypokalemic - WBC mildly elevated at 11.1 - Quantitative beta-HCG 53,545 - Urinalysis is cloudy with elevated protein of 100 (prior U/A in October of 2023 also with the same value) - Differential diagnosis includes provoked seizure in the setting of sleep deprivation with THC use also a possible factor. Psychogenic nonepileptic seizures are also relatively high on the DDx.     Recommendations: - MRI brain WITHOUT contrast (ordered) - LTM EEG with the hopes to capture an event to be able to better characterize  - No AEDs at this time  - Inpatient seizure precautions - Further recommendations following the results of the above tests.     Electronically signed: Dr. Caryl Pina 06/16/2023, 4:45 PM

## 2023-06-16 NOTE — ED Notes (Signed)
ED TO INPATIENT HANDOFF REPORT  ED Nurse Name and Phone #: Vernona Rieger [phone]  S Name/Age/Gender Mercedes [name] 32 y.o. female Room/Bed: 029C/029C  Code Status   Code Status: Prior  Home/SNF/Other Home Patient oriented to: self, place, time, and situation Is this baseline? Yes   Triage Complete: Triage complete  Chief Complaint Poss Seizure no Seizure hx pregnant  Triage Note Per EMS and pt report, pt's husband was woken up when the bed was shaking. The pt appeared to be having a seizure and was unresponsive when he tried to wake her up. When EMS arrived, the pt was postictal and staring at the wall. She was only answering some questions. Pt arrived to ED A&O x3 and is still confused with events. Has no memory of the event. +pregnancy home test a month ago. Pt is also reporting a burning sensation, she says from vomiting so much.   Allergies Allergies  Allergen Reactions   Penicillins Other (See Comments)    Reaction:  Unknown  Has patient had a PCN reaction causing immediate rash, facial/tongue/throat swelling, SOB or lightheadedness with hypotension: Unknown Has patient had a PCN reaction causing severe rash involving mucus membranes or skin necrosis: Unknown Has patient had a PCN reaction that required hospitalization: Unknown Has patient had a PCN reaction occurring within the last 10 years: No If all of the above answers are "NO", then may proceed with Cephalosporin use. Unknown reaction Childhood allergy    Level of Care/Admitting Diagnosis ED Disposition     ED Disposition  Admit   Condition  --   Comment  The patient appears reasonably stabilized for admission considering the current resources, flow, and capabilities available in the ED at this time, and I doubt any other [facility] requiring further screening and/or treatment in the ED prior to admission is  present.          B Medical/Surgery History Past Medical History:  Diagnosis Date   Anemia    Asthma     Hypokalemia    Kidney stone 2018   Past Surgical History:  Procedure Laterality Date   NO PAST SURGERIES     WISDOM TOOTH EXTRACTION       A IV Location/Drains/Wounds Patient Lines/Drains/Airways Status     Active Line/Drains/Airways     Name Placement date Placement time Site Days   Peripheral IV 06/16/23 20 G Anterior;Distal;Left;Upper Arm 06/16/23  0811  Arm  less than 1            Intake/Output Last 24 hours  Intake/Output Summary (Last 24 hours) at 06/16/2023 1757 Last data filed at 06/16/2023 1556 Gross per 24 hour  Intake 1922.92 ml  Output --  Net 1922.92 ml    Labs/Imaging Results for orders placed or performed during the hospital encounter of 06/16/23 (from the past 48 hour(s))  Urinalysis, Routine w reflex microscopic -Urine, Clean Catch     Status: Abnormal   Collection Time: 06/16/23  8:08 AM  Result Value Ref Range   Color, Urine AMBER (A) YELLOW    Comment: BIOCHEMICALS MAY BE AFFECTED BY COLOR   APPearance CLOUDY (A) CLEAR   Specific Gravity, Urine 1.032 (H) 1.005 - 1.030   pH 5.0 5.0 - 8.0   Glucose, UA NEGATIVE NEGATIVE mg/dL   Hgb urine dipstick NEGATIVE NEGATIVE   Bilirubin Urine MODERATE (A) NEGATIVE   Ketones, ur 80 (A) NEGATIVE mg/dL   Protein, ur 366 (A) NEGATIVE mg/dL   Nitrite NEGATIVE NEGATIVE   Leukocytes,Ua NEGATIVE NEGATIVE  RBC / HPF 0-5 0 - 5 RBC/hpf   WBC, UA 0-5 0 - 5 WBC/hpf   Bacteria, UA RARE (A) NONE SEEN   Squamous Epithelial / HPF 6-10 0 - 5 /HPF   Mucus PRESENT     Comment: Performed at [facility], [address], [place], [place] [address]  Comprehensive metabolic panel     Status: Abnormal   Collection Time: 06/16/23  8:44 AM  Result Value Ref Range   Sodium 136 135 - 145 mmol/L   Potassium 3.1 (L) 3.5 - 5.1 mmol/L   Chloride 96 (L) 98 - 111 mmol/L   CO2 25 22 - 32 mmol/L   Glucose, Bld 99 70 - 99 mg/dL    Comment: Glucose reference range applies only to samples taken after fasting for at least 8  hours.   BUN 15 6 - 20 mg/dL   Creatinine, Ser 6.04 0.44 - 1.00 mg/dL   Calcium 9.3 8.9 - 54.0 mg/dL   Total Protein 8.1 6.5 - 8.1 g/dL   Albumin 4.0 3.5 - 5.0 g/dL   AST 16 15 - 41 U/L   ALT 12 0 - 44 U/L   Alkaline Phosphatase 84 38 - 126 U/L   Total Bilirubin 1.4 (H) 0.3 - 1.2 mg/dL   GFR, Estimated >98 >11 mL/min    Comment: (NOTE) Calculated using the CKD-EPI Creatinine Equation (2021)    Anion gap 15 5 - 15    Comment: Performed at [facility], [address] [place], [place] [address]  CBC with Differential/Platelet     Status: Abnormal   Collection Time: 06/16/23  8:44 AM  Result Value Ref Range   WBC 11.1 (H) 4.0 - 10.5 K/uL   RBC 4.72 3.87 - 5.11 MIL/uL   Hemoglobin 13.7 12.0 - 15.0 g/dL   HCT 29.5 62.1 - 30.8 %   MCV 87.7 80.0 - 100.0 fL   MCH 29.0 26.0 - 34.0 pg   MCHC 33.1 30.0 - 36.0 g/dL   RDW 65.7 84.6 - 96.2 %   Platelets 213 150 - 400 K/uL   nRBC 0.0 0.0 - 0.2 %   Neutrophils Relative % 78 %   Neutro Abs 8.7 (H) 1.7 - 7.7 K/uL   Lymphocytes Relative 14 %   Lymphs Abs 1.5 0.7 - 4.0 K/uL   Monocytes Relative 8 %   Monocytes Absolute 0.9 0.1 - 1.0 K/uL   Eosinophils Relative 0 %   Eosinophils Absolute 0.0 0.0 - 0.5 K/uL   Basophils Relative 0 %   Basophils Absolute 0.0 0.0 - 0.1 K/uL   Immature Granulocytes 0 %   Abs Immature Granulocytes 0.03 0.00 - 0.07 K/uL    Comment: Performed at [facility], [address] [place], [place] [address]  Magnesium     Status: None   Collection Time: 06/16/23  8:44 AM  Result Value Ref Range   Magnesium 2.4 1.7 - 2.4 mg/dL    Comment: Performed at [facility], [address], [place], [place] [address]  hCG, quantitative, pregnancy     Status: Abnormal   Collection Time: 06/16/23  8:44 AM  Result Value Ref Range   hCG, Beta Chain, Quant, S 53,545 (H) <5 mIU/mL    Comment:          GEST. AGE      CONC.  (mIU/mL)   <=1 WEEK        5 - 50  2 WEEKS       50 - 500     3 WEEKS       100 -  10,000     4 WEEKS     1,000 - 30,000     5 WEEKS     3,500 - 115,000   6-8 WEEKS     12,000 - 270,000    12 WEEKS     15,000 - 220,000        FEMALE AND NON-PREGNANT FEMALE:     LESS THAN 5 mIU/mL Performed at [facility], [address] [place], [place] [address]   Rapid urine drug screen (hospital performed)     Status: Abnormal   Collection Time: 06/16/23 11:00 AM  Result Value Ref Range   Opiates NONE DETECTED NONE DETECTED   Cocaine NONE DETECTED NONE DETECTED   Benzodiazepines NONE DETECTED NONE DETECTED   Amphetamines NONE DETECTED NONE DETECTED   Tetrahydrocannabinol POSITIVE (A) NONE DETECTED   Barbiturates NONE DETECTED NONE DETECTED    Comment: (NOTE) DRUG SCREEN FOR MEDICAL PURPOSES ONLY.  IF CONFIRMATION IS NEEDED FOR ANY PURPOSE, NOTIFY LAB WITHIN 5 DAYS.  LOWEST DETECTABLE LIMITS FOR URINE DRUG SCREEN Drug Class                     Cutoff (ng/mL) Amphetamine and metabolites    1000 Barbiturate and metabolites    200 Benzodiazepine                 200 Opiates and metabolites        300 Cocaine and metabolites        300 THC                            50 Performed at [facility], [address], [place], [place] [address]   CBG monitoring, ED     Status: None   Collection Time: 06/16/23  4:48 PM  Result Value Ref Range   Glucose-Capillary 94 70 - 99 mg/dL    Comment: Glucose reference range applies only to samples taken after fasting for at least 8 hours.   US OB Comp < 14 Wks  Result Date: 06/16/2023 CLINICAL DATA:  Pelvic pain in 1st trimester pregnancy. EXAM: OBSTETRIC <14 WK [place] AND TRANSVAGINAL OB US TECHNIQUE: Both transabdominal and transvaginal ultrasound examinations were performed for complete evaluation of the gestation as well as the maternal uterus, adnexal regions, and pelvic cul-de-sac. Transvaginal technique was performed to assess early pregnancy. COMPARISON:  None Available. FINDINGS: Intrauterine gestational sac: Single Yolk  sac:  Visualized. Embryo:  Visualized. Cardiac Activity: Visualized. Heart Rate: 191 bpm CRL:  10 mm   7 w   0 d                  [place] EDC: 02/02/2024 Subchorionic hemorrhage:  Small subchorionic hemorrhage noted. Maternal uterus/adnexae: 3.2 cm simple left ovarian cyst noted. Normal appearance of right ovary. No suspicious adnexal mass or abnormal free fluid identified. IMPRESSION: Single living IUP with estimated gestational age of [redacted] weeks 0 days, and [place] EDC of 02/02/2024. Small subchorionic hemorrhage. Electronically Signed   By: Danae Orleans M.D.   On: 06/16/2023 16:41   US OB Transvaginal  Result Date: 06/16/2023 CLINICAL DATA:  Pelvic pain in 1st trimester pregnancy. EXAM: OBSTETRIC <14 WK [place] AND TRANSVAGINAL OB US TECHNIQUE: Both transabdominal and transvaginal ultrasound examinations were performed  for complete evaluation of the gestation as well as the maternal uterus, adnexal regions, and pelvic cul-de-sac. Transvaginal technique was performed to assess early pregnancy. COMPARISON:  None Available. FINDINGS: Intrauterine gestational sac: Single Yolk sac:  Visualized. Embryo:  Visualized. Cardiac Activity: Visualized. Heart Rate: 191 bpm CRL:  10 mm   7 w   0 d                  [place] EDC: 02/02/2024 Subchorionic hemorrhage:  Small subchorionic hemorrhage noted. Maternal uterus/adnexae: 3.2 cm simple left ovarian cyst noted. Normal appearance of right ovary. No suspicious adnexal mass or abnormal free fluid identified. IMPRESSION: Single living IUP with estimated gestational age of [redacted] weeks 0 days, and [place] EDC of 02/02/2024. Small subchorionic hemorrhage. Electronically Signed   By: Danae Orleans M.D.   On: 06/16/2023 16:41    Pending Labs Unresulted Labs (From admission, onward)     Start     Ordered   06/16/23 1727  Culture, OB Urine  Add-on,   AD        06/16/23 1726            Vitals/Pain Today's Vitals   06/16/23 1630 06/16/23 1645 06/16/23 1700 06/16/23 1718  BP: 129/73 (!) 141/76 (!)  112/97   Pulse: 66 71 70   Resp:      Temp:    97.8 F (36.6 C)  TempSrc:    Oral  SpO2: 100% 99% 97%   Weight:      Height:      PainSc:        Isolation Precautions No active isolations  Medications Medications  sodium chloride 0.9 % bolus 1,000 mL (0 mLs Intravenous Stopped 06/16/23 0946)    And  0.9 %  sodium chloride infusion (0 mLs Intravenous Stopped 06/16/23 1556)  ondansetron (ZOFRAN) injection 4 mg (4 mg Intravenous Given 06/16/23 0839)  famotidine (PEPCID) IVPB 20 mg premix (0 mg Intravenous Stopped 06/16/23 0915)  promethazine (PHENERGAN) 12.5 mg in sodium chloride 0.9 % 50 mL IVPB (0 mg Intravenous Stopped 06/16/23 1001)  sodium chloride 0.9 % bolus 1,000 mL (0 mLs Intravenous Stopped 06/16/23 1556)  promethazine (PHENERGAN) 12.5 mg in sodium chloride 0.9 % 50 mL IVPB (0 mg Intravenous Stopped 06/16/23 1556)    Mobility walks     Focused Assessments Neuro Assessment Handoff:  Swallow screen pass? Yes    NIH Stroke Scale  Dizziness Present: No Headache Present: No Interval: Shift assessment Level of Consciousness (1a.)   : Alert, keenly responsive LOC Questions (1b. )   : Answers both questions correctly LOC Commands (1c. )   : Performs both tasks correctly Best Gaze (2. )  : Normal Visual (3. )  : No visual loss Facial Palsy (4. )    : Normal symmetrical movements Motor Arm, Left (5a. )   : No drift Motor Arm, Right (5b. ) : No drift Motor Leg, Left (6a. )  : No drift Motor Leg, Right (6b. ) : No drift Limb Ataxia (7. ): Absent Sensory (8. )  : Normal, no sensory loss Best Language (9. )  : No aphasia Dysarthria (10. ): Normal Extinction/Inattention (11.)   : No Abnormality Complete NIHSS TOTAL: 0     Neuro Assessment: Within Defined Limits Neuro Checks:   Initial (06/16/23 0809)  Has TPA been given? No If patient is a Neuro Trauma and patient is going to OR before floor call report to 4N Charge nurse: [phone] or  [phone]   R Recommendations: See Admitting Provider Note  Report given to:   Additional Notes: Had a second seizure in ED. A&O x1 right after but baseline now. [redacted] weeks pregnant.

## 2023-06-16 NOTE — Progress Notes (Signed)
EEG complete - results pending 

## 2023-06-16 NOTE — Plan of Care (Signed)

## 2023-06-16 NOTE — ED Provider Notes (Signed)
[place] EMERGENCY DEPARTMENT AT [facility] Provider Note   CSN: [id number] Arrival date & time: 06/16/23  0800     History  Chief Complaint  Patient presents with   Seizures    Mercedes [name] is a 32 y.o. female.  Pt is a 32 yo female with pmhx significant for asthma and kidney stones.  Pt said she is pregnant with LMP in early July.  She has not seen obgyn.  She has been having n/v and difficulty sleeping for several days.  Her husband felt the bed shaking and woke up.  Pt would not respond, so he called EMS.  She does not remember what happened.  Per EMS, she was post ictal.  No seizure hx.  No injury.       Home Medications Prior to Admission medications   Medication Sig Start Date End Date Taking? Authorizing Provider  ondansetron (ZOFRAN-ODT) 4 MG disintegrating tablet Take 1 tablet (4 mg total) by mouth every 6 (six) hours as needed for nausea. 07/28/22   Venora Maples, MD  promethazine (PHENERGAN) 25 MG tablet Take 1 tablet (25 mg total) by mouth every 6 (six) hours as needed for nausea or vomiting. 07/28/22   Venora Maples, MD      Allergies    Penicillins    Review of Systems   Review of Systems  Gastrointestinal:  Positive for nausea and vomiting.  Neurological:  Positive for seizures.  All other systems reviewed and are negative.   Physical Exam Updated Vital Signs BP (!) 131/91   Pulse 76   Temp 99.4 F (37.4 C) (Axillary)   Resp 20   Ht 5\' 4"  (1.626 m)   Wt 111.1 kg   LMP  (LMP Unknown)   SpO2 97%   BMI 42.05 kg/m  Physical Exam Vitals and nursing note reviewed.  Constitutional:      Appearance: Normal appearance. She is obese.  HENT:     Head: Normocephalic and atraumatic.     Right Ear: External ear normal.     Left Ear: External ear normal.     Nose: Nose normal.     Mouth/Throat:     Mouth: Mucous membranes are dry.  Eyes:     Extraocular Movements: Extraocular movements intact.     Conjunctiva/sclera:  Conjunctivae normal.     Pupils: Pupils are equal, round, and reactive to light.  Cardiovascular:     Rate and Rhythm: Normal rate and regular rhythm.     Pulses: Normal pulses.     Heart sounds: Normal heart sounds.  Pulmonary:     Effort: Pulmonary effort is normal.     Breath sounds: Normal breath sounds.  Abdominal:     General: Abdomen is flat. Bowel sounds are normal.     Palpations: Abdomen is soft.  Musculoskeletal:        General: Normal range of motion.     Cervical back: Normal range of motion and neck supple.  Skin:    General: Skin is warm.     Capillary Refill: Capillary refill takes less than 2 seconds.  Neurological:     General: No focal deficit present.     Mental Status: She is alert and oriented to person, place, and time.  Psychiatric:        Mood and Affect: Mood normal.        Behavior: Behavior normal.     ED Results / Procedures / Treatments   Labs (all labs  ordered are listed, but only abnormal results are displayed) Labs Reviewed  COMPREHENSIVE METABOLIC PANEL - Abnormal; Notable for the following components:      Result Value   Potassium 3.1 (*)    Chloride 96 (*)    Total Bilirubin 1.4 (*)    All other components within normal limits  CBC WITH DIFFERENTIAL/PLATELET - Abnormal; Notable for the following components:   WBC 11.1 (*)    Neutro Abs 8.7 (*)    All other components within normal limits  URINALYSIS, ROUTINE W REFLEX MICROSCOPIC - Abnormal; Notable for the following components:   Color, Urine AMBER (*)    APPearance CLOUDY (*)    Specific Gravity, Urine 1.032 (*)    Bilirubin Urine MODERATE (*)    Ketones, ur 80 (*)    Protein, ur 100 (*)    Bacteria, UA RARE (*)    All other components within normal limits  HCG, QUANTITATIVE, PREGNANCY - Abnormal; Notable for the following components:   hCG, Beta Chain, Quant, S 53,545 (*)    All other components within normal limits  RAPID URINE DRUG SCREEN, HOSP PERFORMED - Abnormal; Notable  for the following components:   Tetrahydrocannabinol POSITIVE (*)    All other components within normal limits  MAGNESIUM  CBG MONITORING, ED    EKG EKG Interpretation Date/Time:  Friday June 16 2023 08:19:01 EDT Ventricular Rate:  76 PR Interval:  153 QRS Duration:  96 QT Interval:  402 QTC Calculation: 452 R Axis:   74  Text Interpretation: Sinus rhythm No significant change since last tracing Confirmed by Jacalyn Lefevre [phone]) on 06/16/2023 8:20:51 AM  Radiology No results found.  Procedures Procedures    Medications Ordered in ED Medications  sodium chloride 0.9 % bolus 1,000 mL (0 mLs Intravenous Stopped 06/16/23 0946)    And  0.9 %  sodium chloride infusion ( Intravenous New Bag/Given 06/16/23 0945)  ondansetron (ZOFRAN) injection 4 mg (4 mg Intravenous Given 06/16/23 0839)  famotidine (PEPCID) IVPB 20 mg premix (0 mg Intravenous Stopped 06/16/23 0915)  promethazine (PHENERGAN) 12.5 mg in sodium chloride 0.9 % 50 mL IVPB (0 mg Intravenous Stopped 06/16/23 1001)  sodium chloride 0.9 % bolus 1,000 mL (1,000 mLs Intravenous New Bag/Given 06/16/23 1142)  promethazine (PHENERGAN) 12.5 mg in sodium chloride 0.9 % 50 mL IVPB (12.5 mg Intravenous New Bag/Given 06/16/23 1335)    ED Course/ Medical Decision Making/ A&P                                 Medical Decision Making Amount and/or Complexity of Data Reviewed Labs: ordered. Radiology: ordered.  Risk Prescription drug management.   This patient presents to the ED for concern of seizure, this involves an extensive number of treatment options, and is a complaint that carries with it a high risk of complications and morbidity.  The differential diagnosis includes preeclampsia, dehydration, electrolyte abn   Co morbidities that complicate the patient evaluation  Asthma and kidney stones   Additional history obtained:  Additional history obtained from epic chart review External records from outside source  obtained and reviewed including EMS report   Lab Tests:  I Ordered, and personally interpreted labs.  The pertinent results include:  cbc with very slight elevation of wbc at 11.1, cmp with k low at 3.1, mg nl, uds + mj, ua + protein and ketones   Imaging Studies ordered:  I ordered imaging studies  including [place]  I independently visualized and interpreted imaging which showed [place] pending at shift change I agree with the radiologist interpretation   Cardiac Monitoring:  The patient was maintained on a cardiac monitor.  I personally viewed and interpreted the cardiac monitored which showed an underlying rhythm of: nsr   Medicines ordered and prescription drug management:  I ordered medication including ivfs/zofran/phenergan/pepcid  for sx  Reevaluation of the patient after these medicines showed that the patient improved I have reviewed the patients home medicines and have made adjustments as needed   Test Considered:  [place]   Critical Interventions:  ivfs  Problem List / ED Course:  Dehydration:  pt given 2L IVFs + zofran and phenergan.  She is finally tolerating sips of fluids.  ? Seizure:  plan to talk with ob after [place]    Reevaluation:  After the interventions noted above, I reevaluated the patient and found that they have :improved   Social Determinants of Health:  Lives at home   Dispostion:  After consideration of the diagnostic results and the patients response to treatment, I feel that the patent would benefit from pending at shift change.          Final Clinical Impression(s) / ED Diagnoses Final diagnoses:  Hyperemesis gravidarum with dehydration    Rx / DC Orders ED Discharge Orders     None         Jacalyn Lefevre, MD 06/16/23 [phone]

## 2023-06-16 NOTE — ED Provider Notes (Signed)
Received patient in turnover from Dr. Particia Nearing.  Please see their note for further details of Hx, PE.  Briefly patient is a 32 y.o. female with a Seizures .  Husband apparently woke up to the bed shaking.  Patient had had nausea and vomiting for couple days.  Had trouble waking her up and so called EMS.  Per EMS she was may be confused en route with them.  Arrived it is back to her baseline.  No history of seizures.  Newly pregnant.. [id number].  Plans to obtain a ultrasound then discussed with OB.  Patient had a second seizure-like event while in the emergency department.  Reported tonic-clonic like shaking by my arrival the patient had stopped.  She was a bit confused.    No loss of bowel or bladder.  Discussed case with Dr. Otelia Limes, neurology recommended MRI.  He would come to formally see.  I discussed the case with Dr. Despina Hidden, he thought this was unlikely to be secondary to her first trimester pregnancy.  He did suspect it would be more likely consistent with an electrolyte imbalance from persistent nausea and vomiting or perhaps from her marijuana use.  Discussed with family medicine for admission.    Melene Plan, DO 06/16/23 1755

## 2023-06-16 NOTE — ED Notes (Signed)
Patient is going to ultrasound.

## 2023-06-16 NOTE — Hospital Course (Addendum)
32 year old female  [redacted] weeks pregnant  First time seizure work up   Pregnant the last decade   Lost of many pregnancie  7 weeks and 2 days  Continued nause and vomitting  Cannaboid hyperemeis syndrome   This moring saw that she was shaking and she would not wake up  Another event about 1 hour ago. Not able to break it.  Mercedes [name].  Dr. Caralee Ates. Not releated to the pregnancy. Thought more related to canaboid use. Neurofollowing EEG.     Mercedes [name] is a 32 year old female who is [redacted] weeks gestation with her fourth child with PMH significant for asthma and hyperemesis gravidarum in her previous pregnancy. She presented to hospital after having a seizure around 6:45AM in her sleep witnessed by her husband. Her husband reports that he was woken up by her shaking and when he looked at her she was shaking. He described that her head was contracted back in extension and her limbs were contracted while her whole body shook. Her husband rubbed her chest harshly to wake her up and when EMS arrived she was confused during questioning. No bladder or bowel incontinence or foaming from mouth. Pat reports pain on the right side of her tongue.  Pt had a second seizure in the ED after she returned to her room from CT. She was sitting in bed drinking ginger ale when her eyes rolled back and she started to shake. Appeared similar to her previous episode. No limb jerking, bowel/bladder incontinence. This episode lasted for less than 1 minute. She was confused after and did not recognize the nursing staff.    Pt reports that she has not felt well in the last 3-4 days. She denied having chest pain, lightheadedness, and blurry vision the last few days. However, after the seizures she does have lightheadedness, headache, blurry vision (like a film in front of her eyes), and upper and lower extremity heaviness. After seizure she does not have chest pain, SOB, weakness/numbness. She has been having N/V and not keeping  food down the last four days. She is vomiting all day and not keeping down solids and liquids. Her abdomen cramps after she eats. She describes her vomit as initially yellow but starting to look [place] and coffee colored. She denied diarrhea and dysuria. Her last bowel movement was two days ago and she describes it as dark, nonbloody. No increased flatulence. No sick contacts, traveling, swimming, hiking, or drinking unsanitary water.  This is her fourth pregnancy. Normal vaginal deliveries of her other three children. Last pregnancy she had hyperemesis gravidarum. Did not have gestational diabetes, hypertension, proteinuria, or cardiac problems.  Has not seen obgyn for this pregnancy yet bc she just found out. Her LMP was July 3rd. Has had an US performed here to confirm pregnancy in ED. Not taking multivitamins right now.  ED Course: Patient had a transvaginal US in the ED that showed single living IUP with estimated gestational age of [redacted] weeks 0 day. Also noted a small subchorionic hemorrhage. PT had a head CT without contrast that was negative for acute findings. UA on admission with rare bacteria,negative leukocyte esterase,negative nitrite. Potassium low at 3.1, Mg normal at 2.4, T Bili 1.4, Hb 13.7, otherwise CBC and CMP within normal limits. UDS positive for THC.   PMH: Asthma, hyperemesis gravidarum   Meds: Albuterol inhaler   Allergies: penicillin, does not know reaction.  FamHx:  Daughter has seizures and saw Neurology and they said its triggered by  heat.  Mother - heart disease and seizures No seizure in other siblings, grandparents, aunts DM in family   SocHx: Lives with fiance and three daughters Not currently working Independent with all ADLs  Walks well without use of walker/cane  Smokes marijuana (blunt) daily, has not used in a week No alcohol use  PCP: [facility], new patient and has not seen group yet    +lower leg pain, tender to calf squeeze. Noticed it two days  ago. No hx of blood clots + LE edema +abdominal tenderness, diffuse.   CODE: FULL CODE POA: her mother, [phone], Myrene Buddy    06/18/2023 Says she feels sick.couldn't keep her potassium pills down. No changes in BM.Says she is been having headache but is not in. Says she is agreeable to IV po potassium.Try to get nausea under control.

## 2023-06-16 NOTE — H&P (Cosign Needed Addendum)
Date: 06/16/2023               Patient Name:  Mercedes [name] MRN: [id number]  DOB: [birth year] Age / Sex: 32 y.o., female   PCP: Patient, No Pcp Per         Medical Service: Internal Medicine Teaching Service         Attending Physician: Dr. Gust Rung, DO      First Contact: Dr. Jeral Pinch, DO Pager [phone]    Second Contact: Dr. Modena Slater, DO Pager [phone]         After Hours (After 5p/  First Contact Pager: [phone]  weekends / holidays): Second Contact Pager: [phone]   SUBJECTIVE   Chief Complaint: New onset seizure  History of Present Illness:   Mercedes [name] is a 32 year old female who is [redacted] weeks gestation with her fourth child with PMH significant for asthma and hyperemesis gravidarum in her previous pregnancy. She presented to hospital after having a seizure around 6:45AM in her sleep witnessed by her fiance. Her fiance reports that he was woken up by her shaking and when he looked at her she was shaking. Her fiance rubbed her chest harshly to wake her up, and when EMS arrived she was confused during questioning. No bladder or bowel incontinence or foaming from mouth. Patient reports pain on the right side of her tongue.  Pt had a second seizure in the ED after she returned to her room from CT. She was sitting in bed drinking ginger ale when her eyes rolled back and she started to shake. Appeared similar to her previous episode. No limb jerking, bowel/bladder incontinence. This episode lasted for less than 1 minute. She was confused after and did not recognize the nursing staff.    Pt reports that she has not felt well in the last 3-4 days. She denied having chest pain, lightheadedness, and blurry vision the last few days. However, after the seizures she does have lightheadedness, headache, blurry vision (like a film in front of her eyes), and upper and lower extremity heaviness. After seizure she does not have chest pain, SOB, weakness/numbness. She has been  having N/V and not keeping food down the last four days. She is vomiting all day and not keeping down solids and liquids. Her abdomen cramps after she eats. She describes her vomit as initially yellow but starting to look [place] and coffee colored. She denied diarrhea and dysuria. Her last bowel movement was two days ago and she describes it as dark, nonbloody. No sick contacts, traveling, swimming, hiking, or drinking unsanitary water.  This is her fourth pregnancy. Normal vaginal deliveries of her other three children. Last pregnancy she had hyperemesis gravidarum. Did not have gestational diabetes, hypertension, proteinuria, or cardiac problems. Has not seen obgyn for this pregnancy yet bc she just found out. Her LMP was July 3rd. Has had an US performed here to confirm pregnancy in ED. Not taking multivitamins right now.  ED Course: Patient had a transvaginal US in the ED that showed single living IUP with estimated gestational age of [redacted] weeks 0 day. Also noted a small subchorionic hemorrhage. PT had a head CT without contrast that was negative for acute findings. UA on admission with rare bacteria,negative leukocyte esterase,negative nitrite. Potassium low at 3.1, Mg normal at 2.4, T Bili 1.4, Hb 13.7, otherwise CBC and CMP within normal limits. UDS positive for THC.  Neurology consulted.  Meds:  Albuterol inhaler  Past Medical History Childhood  asthma  Past Surgical History:  Procedure Laterality Date   NO PAST SURGERIES     WISDOM TOOTH EXTRACTION      Social:  Lives with fiance and three daughters Not currently working Independent with all ADLs and IADLs.  Smokes marijuana (blunt) daily, has not used in a week No alcohol use  PCP: [facility], new patient and has not seen group yet   Family History:  Daughter has seizures and saw Neurology and they said its triggered by heat.  Mother - heart disease and seizures No seizure in other siblings, grandparents, aunts DM in family     Allergies: Allergies as of 06/16/2023 - Review Complete 06/16/2023  Allergen Reaction Noted   Penicillins Other (See Comments) 01/25/2016    Review of Systems: A complete ROS was negative except as per HPI.   OBJECTIVE:   Physical Exam: Blood pressure (!) 112/97, pulse 70, temperature 97.8 F (36.6 C), temperature source Oral, resp. rate 20, height 5\' 4"  (1.626 m), weight 111.1 kg, SpO2 97%.  Constitutional: Appears tired, no acute distress. HENT: normocephalic atraumatic, mucous membranes moist Eyes: conjunctiva non-erythematous Neck: supple Cardiovascular: regular rate and rhythm, no m/r/g.  No lower extremity edema bilaterally.  2+ DP pulses intact bilaterally. Pulmonary/Chest: normal work of breathing on room air, lungs clear to auscultation bilaterally Abdominal: soft, non-tender, non-distended, mild tenderness to palpation lower abdomen. MSK: Tenderness to palpation posterior lower distal extremities. Neurological: alert & oriented x 3, 5/5 strength in bilateral upper and lower extremities Skin: warm and dry Psych: Normal mood and effect  Labs: CBC    Component Value Date/Time   WBC 11.1 (H) 06/16/2023 0844   RBC 4.72 06/16/2023 0844   HGB 13.7 06/16/2023 0844   HGB 9.5 (L) 06/08/2017 1536   HCT 41.4 06/16/2023 0844   HCT 27.4 (L) 06/08/2017 1536   PLT 213 06/16/2023 0844   PLT 188 06/08/2017 1536   MCV 87.7 06/16/2023 0844   MCV 85 06/08/2017 1536   MCH 29.0 06/16/2023 0844   MCHC 33.1 06/16/2023 0844   RDW 12.8 06/16/2023 0844   RDW 13.7 06/08/2017 1536   LYMPHSABS 1.5 06/16/2023 0844   MONOABS 0.9 06/16/2023 0844   EOSABS 0.0 06/16/2023 0844   BASOSABS 0.0 06/16/2023 0844     CMP     Component Value Date/Time   NA 136 06/16/2023 0844   NA 137 06/08/2017 1536   K 3.1 (L) 06/16/2023 0844   CL 96 (L) 06/16/2023 0844   CO2 25 06/16/2023 0844   GLUCOSE 99 06/16/2023 0844   BUN 15 06/16/2023 0844   BUN 9 06/08/2017 1536   CREATININE 0.78  06/16/2023 0844   CALCIUM 9.3 06/16/2023 0844   PROT 8.1 06/16/2023 0844   ALBUMIN 4.0 06/16/2023 0844   AST 16 06/16/2023 0844   ALT 12 06/16/2023 0844   ALKPHOS 84 06/16/2023 0844   BILITOT 1.4 (H) 06/16/2023 0844   GFRNONAA >60 06/16/2023 0844   GFRAA >60 05/30/2019 0458    Imaging:  Ultrasound OB Less than 14 weeks/Ultrasound OB transvaginal IMPRESSION: Single living IUP with estimated gestational age of [redacted] weeks 0 days, and [place] EDC of 02/02/2024. Small subchorionic hemorrhage.  CT head without contrast IMPRESSION: Negative non contrasted CT appearance of the brain.  MRI brain without contrast Pending  EKG: personally reviewed my interpretation is sinus rate and rhythm.  Normal PR interval, no QTc prolongation, compared to prior EKG.  ASSESSMENT & PLAN:   Assessment & Plan by Problem: Principal  Problem:   [facility] [employer]   Keiasha Mikhaela Giorgi is a 32 y.o. [phone], pleasant female with with past medical history of childhood asthma presented to ED for new onset seizures this morning and admitted for further workup.  #New onset seizures #Multiple bouts of vomiting #[redacted] week Pregnant  #leukocytosis Presented with new onset seizure this morning at 6:45 AM witnessed by her fianc, who noted she was shaking in her sleep, no limb jerking or bowel/bladder incontinence, lasted for less than 1 minute with postictal state. Patient had another seizure in ED after her head CT with similar episode as this morning, her eyes rolled back, shaking. No limb jerking or bowel/bladder incontinence. Lasted < 1 min with post-ictal state with associated symptoms of blurry vision and headaches. Pt denied any previous episodes or history of seizures. Does report history of seizure in mother and in daughter, no other family members. Neurology consulted in ED. Differentials include electrolyte abnormalities vs structural vs toxins vs hyperemesis gravidarum versus hyperemesis cannabinoid syndrome. Labs  showed sodium 136, potassium 3.1, creatinine 0.78, Mg 2.4. Hemoglobin 13.7, WBC 11.1. Urine tox positive THC. Beta HcG 53,545. Will continue workup with CK, TSH, EEG.  -Follow-up on EEG -Follow-up MRI brain -Folic acid 1 g daily -Lactated Ringer's at 100 mL/h infusion -Tylenol 650 mg oral every 8 hours as needed for headaches -Zofran IV 4 mg every 8 hours as needed for nausea and vomiting -MiraLAX as needed for constipation -Follow neurology recommendations  #Asymptomatic bacteriuria UA showed moderate bilirubin, ketones 80, Protein 100, and rare bacteria.  Patient denies any symptoms of dysuria or increase in frequency. Urine cultures pending. -Follow urine cultures   Diet: Normal VTE: Enoxaparin IVF: LR, 100 ml/hr Code: Full  Prior to Admission Living Arrangement: Home, living fianc and 3 daughters Anticipated Discharge Location: Home Barriers to Discharge: Medical management  Dispo: Admit patient to Observation with expected length of stay less than 2 midnights.  Signed: Jeral Pinch, DO Internal Medicine Resident PGY-1  06/16/2023, 7:05 PM

## 2023-06-16 NOTE — ED Notes (Signed)
Going to MRI.

## 2023-06-16 NOTE — ED Notes (Signed)
Pt just had a seizure. Dr Adela Lank to bedside. Pt is confused during postictal phase. Lasted approximately 30secs.

## 2023-06-16 NOTE — ED Notes (Signed)
Pt is A&O x3. Pt is confused with events. Pt reports having nausea and is a little dizzy. Seizure pads in place.

## 2023-06-16 NOTE — ED Triage Notes (Signed)
Per EMS and pt report, pt's husband was woken up when the bed was shaking. The pt appeared to be having a seizure and was unresponsive when he tried to wake her up. When EMS arrived, the pt was postictal and staring at the wall. She was only answering some questions. Pt arrived to ED A&O x3 and is still confused with events. Has no memory of the event. +pregnancy home test a month ago. Pt is also reporting a burning sensation, she says from vomiting so much.

## 2023-06-17 DIAGNOSIS — D72829 Elevated white blood cell count, unspecified: Secondary | ICD-10-CM | POA: Diagnosis present

## 2023-06-17 DIAGNOSIS — O208 Other hemorrhage in early pregnancy: Secondary | ICD-10-CM | POA: Diagnosis present

## 2023-06-17 DIAGNOSIS — O99281 Endocrine, nutritional and metabolic diseases complicating pregnancy, first trimester: Secondary | ICD-10-CM | POA: Diagnosis present

## 2023-06-17 DIAGNOSIS — R32 Unspecified urinary incontinence: Secondary | ICD-10-CM | POA: Diagnosis present

## 2023-06-17 DIAGNOSIS — R1110 Vomiting, unspecified: Secondary | ICD-10-CM | POA: Diagnosis not present

## 2023-06-17 DIAGNOSIS — Z880 Allergy status to penicillin: Secondary | ICD-10-CM | POA: Diagnosis not present

## 2023-06-17 DIAGNOSIS — O211 Hyperemesis gravidarum with metabolic disturbance: Secondary | ICD-10-CM | POA: Diagnosis present

## 2023-06-17 DIAGNOSIS — O99321 Drug use complicating pregnancy, first trimester: Secondary | ICD-10-CM | POA: Diagnosis present

## 2023-06-17 DIAGNOSIS — Z825 Family history of asthma and other chronic lower respiratory diseases: Secondary | ICD-10-CM | POA: Diagnosis not present

## 2023-06-17 DIAGNOSIS — R569 Unspecified convulsions: Secondary | ICD-10-CM | POA: Diagnosis present

## 2023-06-17 DIAGNOSIS — O99891 Other specified diseases and conditions complicating pregnancy: Secondary | ICD-10-CM | POA: Diagnosis not present

## 2023-06-17 DIAGNOSIS — O99351 Diseases of the nervous system complicating pregnancy, first trimester: Secondary | ICD-10-CM | POA: Diagnosis present

## 2023-06-17 DIAGNOSIS — Z87891 Personal history of nicotine dependence: Secondary | ICD-10-CM | POA: Diagnosis not present

## 2023-06-17 DIAGNOSIS — O99111 Other diseases of the blood and blood-forming organs and certain disorders involving the immune mechanism complicating pregnancy, first trimester: Secondary | ICD-10-CM | POA: Diagnosis present

## 2023-06-17 DIAGNOSIS — E860 Dehydration: Secondary | ICD-10-CM | POA: Diagnosis present

## 2023-06-17 DIAGNOSIS — F1290 Cannabis use, unspecified, uncomplicated: Secondary | ICD-10-CM | POA: Diagnosis present

## 2023-06-17 DIAGNOSIS — O99320 Drug use complicating pregnancy, unspecified trimester: Secondary | ICD-10-CM | POA: Diagnosis not present

## 2023-06-17 DIAGNOSIS — Z87442 Personal history of urinary calculi: Secondary | ICD-10-CM | POA: Diagnosis not present

## 2023-06-17 DIAGNOSIS — R8271 Bacteriuria: Secondary | ICD-10-CM | POA: Diagnosis present

## 2023-06-17 DIAGNOSIS — Z3A01 Less than 8 weeks gestation of pregnancy: Secondary | ICD-10-CM | POA: Diagnosis not present

## 2023-06-17 LAB — COMPREHENSIVE METABOLIC PANEL
ALT: 10 U/L (ref 0–44)
AST: 12 U/L — ABNORMAL LOW (ref 15–41)
Albumin: 3.2 g/dL — ABNORMAL LOW (ref 3.5–5.0)
Alkaline Phosphatase: 67 U/L (ref 38–126)
Anion gap: 11 (ref 5–15)
BUN: 9 mg/dL (ref 6–20)
CO2: 24 mmol/L (ref 22–32)
Calcium: 8.6 mg/dL — ABNORMAL LOW (ref 8.9–10.3)
Chloride: 98 mmol/L (ref 98–111)
Creatinine, Ser: 0.64 mg/dL (ref 0.44–1.00)
GFR, Estimated: >60 mL/min (ref >60)
Glucose, Bld: 97 mg/dL (ref 70–99)
Potassium: 2.9 mmol/L — ABNORMAL LOW (ref 3.5–5.1)
Sodium: 133 mmol/L — ABNORMAL LOW (ref 135–145)
Total Bilirubin: 1.4 mg/dL — ABNORMAL HIGH (ref 0.3–1.2)
Total Protein: 6.6 g/dL (ref 6.5–8.1)

## 2023-06-17 LAB — CBC
HCT: 34.7 % — ABNORMAL LOW (ref 36.0–46.0)
Hemoglobin: 12 g/dL (ref 12.0–15.0)
MCH: 30.2 pg (ref 26.0–34.0)
MCHC: 34.6 g/dL (ref 30.0–36.0)
MCV: 87.2 fL (ref 80.0–100.0)
Platelets: 168 10*3/uL (ref 150–400)
RBC: 3.98 MIL/uL (ref 3.87–5.11)
RDW: 12.7 % (ref 11.5–15.5)
WBC: 9.4 10*3/uL (ref 4.0–10.5)
nRBC: 0 % (ref 0.0–0.2)

## 2023-06-17 LAB — PHOSPHORUS: Phosphorus: 3.4 mg/dL (ref 2.5–4.6)

## 2023-06-17 LAB — HIV ANTIBODY (ROUTINE TESTING W REFLEX): HIV Screen 4th Generation wRfx: NONREACTIVE

## 2023-06-17 LAB — T4, FREE: Free T4: 1.18 ng/dL — ABNORMAL HIGH (ref 0.61–1.12)

## 2023-06-17 MED ORDER — ENOXAPARIN SODIUM 60 MG/0.6ML IJ SOSY
50.0000 mg | PREFILLED_SYRINGE | INTRAMUSCULAR | Status: DC
  Administered 2023-06-17 – 2023-06-20 (×4): 50 mg via SUBCUTANEOUS
  Filled 2023-06-17 (×4): qty 0.6

## 2023-06-17 MED ORDER — VITAMIN B-6 25 MG PO TABS
25.0000 mg | ORAL_TABLET | Freq: Once | ORAL | Status: AC
  Administered 2023-06-17: 25 mg via ORAL
  Filled 2023-06-17: qty 1

## 2023-06-17 MED ORDER — VITAMIN B-6 25 MG PO TABS
25.0000 mg | ORAL_TABLET | Freq: Every day | ORAL | Status: DC
  Administered 2023-06-17 – 2023-06-20 (×4): 25 mg via ORAL
  Filled 2023-06-17 (×5): qty 1

## 2023-06-17 MED ORDER — POTASSIUM CHLORIDE 20 MEQ PO PACK
40.0000 meq | PACK | Freq: Once | ORAL | Status: AC
  Administered 2023-06-17: 40 meq via ORAL
  Filled 2023-06-17: qty 2

## 2023-06-17 MED ORDER — DIMENHYDRINATE 50 MG PO TABS
50.0000 mg | ORAL_TABLET | Freq: Three times a day (TID) | ORAL | Status: DC | PRN
  Administered 2023-06-17 – 2023-06-19 (×5): 50 mg via ORAL
  Filled 2023-06-17 (×6): qty 1

## 2023-06-17 MED ORDER — POTASSIUM CHLORIDE 2 MEQ/ML IV SOLN
INTRAVENOUS | Status: AC
  Filled 2023-06-17: qty 1000

## 2023-06-17 MED ORDER — DOXYLAMINE SUCCINATE (SLEEP) 25 MG PO TABS
25.0000 mg | ORAL_TABLET | Freq: Once | ORAL | Status: AC
  Administered 2023-06-17: 25 mg via ORAL
  Filled 2023-06-17: qty 1

## 2023-06-17 MED ORDER — DOXYLAMINE SUCCINATE (SLEEP) 25 MG PO TABS
25.0000 mg | ORAL_TABLET | Freq: Every day | ORAL | Status: DC
  Administered 2023-06-17 – 2023-06-20 (×4): 25 mg via ORAL
  Filled 2023-06-17 (×5): qty 1

## 2023-06-17 MED ORDER — FOSFOMYCIN TROMETHAMINE 3 G PO PACK
3.0000 g | PACK | Freq: Once | ORAL | Status: AC
  Administered 2023-06-17: 3 g via ORAL
  Filled 2023-06-17: qty 3

## 2023-06-17 NOTE — Procedures (Signed)
Patient Name: Mercedes [name]  MRN: [id number]  Epilepsy Attending: Charlsie Quest  Referring Physician/Provider: Gust Rung, DO  Duration: 06/16/2023 2309 to 06/17/2023 2309   Patient history: 32 y.o. pregnant female, current regular marijuana smoker, with a PMHx of anemia, asthma, cannabinoid hyperemesis syndrome, hypokalemia and nephrolithiasis, who presents to the ED with new onset seizures. EEG to evaluate for seizure.   Level of alertness: Awake, asleep   AEDs during EEG study: None   Technical aspects: This EEG study was done with scalp electrodes positioned according to the 10-20 International system of electrode placement. Electrical activity was reviewed with band pass filter of 1-70Hz , sensitivity of 7 uV/mm, display speed of 52mm/sec with a 60Hz  notched filter applied as appropriate. EEG data were recorded continuously and digitally stored.  Video monitoring was available and reviewed as appropriate.   Description: The posterior dominant rhythm consists of 9 Hz activity of moderate voltage (25-35 uV) seen predominantly in posterior head regions, symmetric and reactive to eye opening and eye closing.  Sleep was characterized by vertex waves, sleep spindles (12-14Hz ), maximal fronto-central region.   IMPRESSION: This study is within normal limits. No seizures or epileptiform discharges were seen throughout the recording.   A normal interictal EEG does not exclude the diagnosis of epilepsy.     Meldon Hanzlik [name]

## 2023-06-17 NOTE — Progress Notes (Signed)
Neurology Progress Note   S:// Patient is being assisted with ambulation back to the bed at time of evaluation. No family at the bedside. RN at the bedside. No new neurological events overnight. No spells overnight. Patient is on LTM.    O:// Current vital signs: BP 102/79 (BP Location: Left Arm)   Pulse 66   Temp 98 F (36.7 C) (Oral)   Resp 17   Ht 5\' 4"  (1.626 m)   Wt 111.1 kg   LMP  (LMP Unknown)   SpO2 98%   BMI 42.05 kg/m  Vital signs in last 24 hours: Temp:  [97.8 F (36.6 C)-99.4 F (37.4 C)] 98 F (36.7 C) (08/24 0856) Pulse Rate:  [62-76] 66 (08/24 0856) Resp:  [14-27] 17 (08/24 0856) BP: (101-141)/(57-97) 102/79 (08/24 0856) SpO2:  [96 %-100 %] 98 % (08/24 0856)  GENERAL: Awake, alert in NAD HEENT: - Normocephalic and atraumatic, dry mm LUNGS - Clear to auscultation bilaterally with no wheezes CV - S1S2 RRR, no m/r/g, equal pulses bilaterally. ABDOMEN - Soft, nontender, nondistended with normoactive BS Ext: warm, well perfused, intact peripheral pulses, no edema  NEURO:  Mental Status: AA&Ox3  Language: speech is clear.  Naming, repetition, fluency, and comprehension intact. Cranial Nerves: PERRL EOMI, visual fields full, no facial asymmetry, facial sensation intact, hearing intact, tongue/uvula/soft palate midline, normal sternocleidomastoid and trapezius muscle strength. No evidence of tongue atrophy  Motor: 5/5 in all 4 extremities Tone is normal and bulk is normal Sensation- Intact to light touch bilaterally Coordination: FTN intact bilaterally, no ataxia in BLE. Gait- Deferred  Medications  Current Facility-Administered Medications:    [COMPLETED] sodium chloride 0.9 % bolus 1,000 mL, 1,000 mL, Intravenous, Once, Stopped at 06/16/23 0946 **AND** 0.9 %  sodium chloride infusion, , Intravenous, Continuous, Jacalyn Lefevre, MD, Stopped at 06/16/23 1556   acetaminophen (TYLENOL) tablet 650 mg, 650 mg, Oral, Q8H, Patel, Amar, DO, 650 mg at 06/17/23 0544    folic acid (FOLVITE) tablet 1 mg, 1 mg, Oral, Daily, Modena Slater, DO, 1 mg at 06/17/23 1914   fosfomycin (MONUROL) packet 3 g, 3 g, Oral, Once, Modena Slater, DO   lactated ringers 1,000 mL with potassium chloride 40 mEq infusion, , Intravenous, Continuous, Patel, Amar, DO   ondansetron [facility]) injection 4 mg, 4 mg, Intravenous, Q8H PRN, Modena Slater, DO, 4 mg at 06/17/23 0307   polyethylene glycol (MIRALAX / GLYCOLAX) packet 17 g, 17 g, Oral, Daily PRN, Modena Slater, DO  Labs CBC    Component Value Date/Time   WBC 9.4 06/17/2023 0602   RBC 3.98 06/17/2023 0602   HGB 12.0 06/17/2023 0602   HGB 9.5 (L) 06/08/2017 1536   HCT 34.7 (L) 06/17/2023 0602   HCT 27.4 (L) 06/08/2017 1536   PLT 168 06/17/2023 0602   PLT 188 06/08/2017 1536   MCV 87.2 06/17/2023 0602   MCV 85 06/08/2017 1536   MCH 30.2 06/17/2023 0602   MCHC 34.6 06/17/2023 0602   RDW 12.7 06/17/2023 0602   RDW 13.7 06/08/2017 1536   LYMPHSABS 1.5 06/16/2023 0844   MONOABS 0.9 06/16/2023 0844   EOSABS 0.0 06/16/2023 0844   BASOSABS 0.0 06/16/2023 0844    CMP     Component Value Date/Time   NA 133 (L) 06/17/2023 0602   NA 137 06/08/2017 1536   K 2.9 (L) 06/17/2023 0602   CL 98 06/17/2023 0602   CO2 24 06/17/2023 0602   GLUCOSE 97 06/17/2023 0602   BUN 9 06/17/2023 0602  BUN 9 06/08/2017 1536   CREATININE 0.64 06/17/2023 0602   CALCIUM 8.6 (L) 06/17/2023 0602   PROT 6.6 06/17/2023 0602   ALBUMIN 3.2 (L) 06/17/2023 0602   AST 12 (L) 06/17/2023 0602   ALT 10 06/17/2023 0602   ALKPHOS 67 06/17/2023 0602   BILITOT 1.4 (H) 06/17/2023 0602   GFRNONAA >60 06/17/2023 0602   GFRAA >60 05/30/2019 0458    Lipid Panel  No results found for: "CHOL", "TRIG", "HDL", "CHOLHDL", "VLDL", "LDLCALC", "LDLDIRECT"  No results found for: "HGBA1C"   Imaging I have reviewed images in epic and the results pertinent to this consultation are:  CT-scan of the brain- no acute process   MRI examination of the brain- no acute  process  EEG normal  LTM EEG so far is normal   TSH low at 0.254 Free T4 elevated at 1.18 T3 pending  NA 133 K 2.9 Pregnancy test positive  Assessment: 32 y.o. pregnant female, current regular marijuana smoker, with a PMHx of anemia, asthma, cannabinoid hyperemesis syndrome, hypokalemia and nephrolithiasis, who presents to the ED with new onset seizures  - Exam continued to be nonfocal - No seizure-like events overnight - Na 133 and K 2.9 this morning - LTM EEG (06/16/2023 2309 to 06/17/2023 0557):The posterior dominant rhythm consists of 9 Hz activity of moderate voltage (25-35 uV) seen predominantly in posterior head regions, symmetric and reactive to eye opening and eye closing.  Sleep was characterized by vertex waves, sleep spindles (12-14Hz ), maximal fronto-central region. This study is within normal limits. No seizures or epileptiform discharges were seen throughout the recording.  Recommendations: - Correct electrolytes  - Continue LTM for another day  - Neurology will continue to follow   Electronically signed: Dr. Caryl Pina

## 2023-06-17 NOTE — Procedures (Addendum)
Patient Name: Mercedes [name]  MRN: [id number]  Epilepsy Attending: Charlsie Quest  Referring Physician/Provider: Caryl Pina, MD  Date: 06/16/2023 Duration: 28.44 mins  Patient history: 32 y.o. pregnant female, current regular marijuana smoker, with a PMHx of anemia, asthma, cannabinoid hyperemesis syndrome, hypokalemia and nephrolithiasis, who presents to the ED with new onset seizures. EEG to evaluate for seizure.  Level of alertness: Awake  AEDs during EEG study: None  Technical aspects: This EEG study was done with scalp electrodes positioned according to the 10-20 International system of electrode placement. Electrical activity was reviewed with band pass filter of 1-70Hz , sensitivity of 7 uV/mm, display speed of 108mm/sec with a 60Hz  notched filter applied as appropriate. EEG data were recorded continuously and digitally stored.  Video monitoring was available and reviewed as appropriate.  Description: The posterior dominant rhythm consists of 9 Hz activity of moderate voltage (25-35 uV) seen predominantly in posterior head regions, symmetric and reactive to eye opening and eye closing.  Physiologic photic driving was not seen during photic stimulation.  Hyperventilation was not performed.     IMPRESSION: This study is within normal limits. No seizures or epileptiform discharges were seen throughout the recording.  A normal interictal EEG does not exclude the diagnosis of epilepsy.   [name]

## 2023-06-17 NOTE — Progress Notes (Signed)
HD#0 SUBJECTIVE:  Patient Summary: Mercedes [name] is a 32 year old female who is [redacted] weeks gestation with her fourth child with PMH significant for asthma and hyperemesis gravidarum in her previous pregnancy. She presented to hospital after having a seizure around 6:45AM on 8/23 in her sleep witnessed by her fiance, admitted for further work up.   Overnight Events: None  Interim History: Patient evaluated bedside, with current EEG in process.  Patient reports 3 episodes of nausea and vomiting last night and episode of vomiting this morning.  Patient reports after receiving doxylamine/pyridoxine combination this morning, her nausea has gotten better. Patient was notified that phenergan is not safe during pregnancy. Otherwise, pt denies any other episodes of seizures. Reports headaches are improving. Reports lower abdominal pain is improving. Denies any chest pain or shortness of breath. Denies any dysuria.     OBJECTIVE:  Vital Signs: Vitals:   06/17/23 0000 06/17/23 0330 06/17/23 0856 06/17/23 1151  BP: 101/61 (!) 111/57 102/79 112/74  Pulse: 65 62 66 73  Resp: 14 16 17 17   Temp: 98.2 F (36.8 C) 99 F (37.2 C) 98 F (36.7 C) 97.9 F (36.6 C)  TempSrc: Oral Oral Oral Axillary  SpO2: 97% 98% 98% 100%  Weight:      Height:       Supplemental O2: Nasal Cannula SpO2: 100 %  Filed Weights   06/16/23 0815  Weight: 111.1 kg     Intake/Output Summary (Last 24 hours) at 06/17/2023 1326 Last data filed at 06/17/2023 0500 Gross per 24 hour  Intake 2642.76 ml  Output --  Net 2642.76 ml   Net IO Since Admission: 2,742.76 mL [06/17/23 1326]  Physical Exam: Physical Exam  Constitutional: Appears tired, no acute distress.  HENT: normocephalic atraumatic, with EEG leads on head.  Cardiovascular: regular rate and rhythm, no m/r/g.  No lower extremity edema bilaterally.  2+ DP pulses intact bilaterally. Pulmonary/Chest: normal work of breathing on room air, lungs clear to auscultation  bilaterally Abdominal: soft, non-distended, mild tenderness to palpation lower abdomen. Neurological: alert & oriented x 3, no focal deficits  Skin: warm and dry Psych: Normal mood and effect   Patient Lines/Drains/Airways Status     Active Line/Drains/Airways     Name Placement date Placement time Site Days   Peripheral IV 06/17/23 Anterior;Right Forearm 06/17/23  0022  Forearm  less than 1             ASSESSMENT/PLAN:  Assessment: Principal Problem:   Seizure (HCC)   Mercedes [name] is a 32 y.o. [phone], pleasant female with with past medical history of childhood asthma presented to ED for new onset seizures this morning and admitted for further workup.   #New onset seizures #Multiple bouts of vomiting #[redacted] week Pregnant  #leukocytosis #Hypokalemia  Presented with new onset seizure 8/23 morning at 6:45 AM witnessed by her fianc, who noted she was shaking in her sleep, no limb jerking or bowel/bladder incontinence, lasted for less than 1 minute with postictal state. Patient had another seizure in ED after her head CT with similar episode as this morning, her eyes rolled back, shaking. No limb jerking or bowel/bladder incontinence. Lasted < 1 min with post-ictal state with associated symptoms of blurry vision and headaches. Pt denied any previous episodes or history of seizures. Does report history of seizure in mother and in daughter, no other family members. Neurology consulted in ED. Differentials include electrolyte abnormalities vs structural vs toxins vs hyperemesis gravidarum versus hyperemesis cannabinoid  syndrome. Initial labs showed sodium 136, potassium 3.1, creatinine 0.78, Mg 2.4. Hemoglobin 13.7, WBC 11.1. Urine tox positive THC. Beta HcG 53,545.  TSH 0.254, CK22, free T41.18, T3 pending, phosphorus 3.4. EEG so far negative for any seizures. MR Brain negative for intracranial abnormality, but shows 2-weighted signal within the white matter.   Patient reports 3  episodes of vomiting last night and this morning. She reports, she threw up her PO KCL as well. Otherwise, no other concerns at this time.   -Folic acid 1 g daily -Lactated Ringer's at 100 mL/hr with KCL 40 mEq infusion  -Tylenol 650 mg oral every 8 hours as needed for headaches -Zofran IV 4 mg every 8 hours as needed for nausea and vomiting -Doxylamine 25 mg and Pyridoxine 25 mg, daily at bedtime for nausea -MiraLAX as needed for constipation -Seizure prophylaxis  -Follow neurology recommendations   #Asymptomatic bacteriuria UA showed moderate bilirubin, ketones 80, Protein 100, and rare bacteria.  Patient denies any symptoms of dysuria or increase in frequency. Urine cultures pending. -Fofomycin 3g one dose  Best Practice: Diet: Regular diet IVF: Fluids: LR, Rate:  100 mL/hr for 10hours VTE: enoxaparin (LOVENOX) injection 40 mg Start: 06/17/23 1415 Code: Full AB: None Therapy Recs: None, DME: none DISPO: Anticipated discharge  1-2 days  to Home pending  Medical management .  Signature: Jeral Pinch, D.O.  Internal Medicine Resident, PGY-1 [facility]  Pager: [phone] 1:26 PM, 06/17/2023   Please contact the on call pager after 5 pm and on weekends at [phone].

## 2023-06-17 NOTE — Progress Notes (Signed)
EEG maint complete.  ?

## 2023-06-18 ENCOUNTER — Other Ambulatory Visit: Payer: Self-pay

## 2023-06-18 DIAGNOSIS — R569 Unspecified convulsions: Secondary | ICD-10-CM | POA: Diagnosis not present

## 2023-06-18 DIAGNOSIS — O99320 Drug use complicating pregnancy, unspecified trimester: Secondary | ICD-10-CM | POA: Diagnosis not present

## 2023-06-18 DIAGNOSIS — O99891 Other specified diseases and conditions complicating pregnancy: Secondary | ICD-10-CM | POA: Diagnosis not present

## 2023-06-18 DIAGNOSIS — O211 Hyperemesis gravidarum with metabolic disturbance: Secondary | ICD-10-CM | POA: Diagnosis not present

## 2023-06-18 LAB — COMPREHENSIVE METABOLIC PANEL
ALT: 9 U/L (ref 0–44)
AST: 11 U/L — ABNORMAL LOW (ref 15–41)
Albumin: 3 g/dL — ABNORMAL LOW (ref 3.5–5.0)
Alkaline Phosphatase: 67 U/L (ref 38–126)
Anion gap: 7 (ref 5–15)
BUN: <5 mg/dL — ABNORMAL LOW (ref 6–20)
CO2: 23 mmol/L (ref 22–32)
Calcium: 8.4 mg/dL — ABNORMAL LOW (ref 8.9–10.3)
Chloride: 101 mmol/L (ref 98–111)
Creatinine, Ser: 0.63 mg/dL (ref 0.44–1.00)
GFR, Estimated: >60 mL/min (ref >60)
Glucose, Bld: 83 mg/dL (ref 70–99)
Potassium: 3 mmol/L — ABNORMAL LOW (ref 3.5–5.1)
Sodium: 131 mmol/L — ABNORMAL LOW (ref 135–145)
Total Bilirubin: 1.2 mg/dL (ref 0.3–1.2)
Total Protein: 6.2 g/dL — ABNORMAL LOW (ref 6.5–8.1)

## 2023-06-18 LAB — NA AND K (SODIUM & POTASSIUM), RAND UR
Potassium Urine: 26 mmol/L
Sodium, Ur: 212 mmol/L

## 2023-06-18 LAB — OSMOLALITY: Osmolality: 274 mOsm/kg — ABNORMAL LOW (ref 275–295)

## 2023-06-18 LAB — CULTURE, OB URINE

## 2023-06-18 LAB — PHOSPHORUS: Phosphorus: 3.1 mg/dL (ref 2.5–4.6)

## 2023-06-18 LAB — T3: T3, Total: 107 ng/dL (ref 71–180)

## 2023-06-18 LAB — MAGNESIUM: Magnesium: 1.8 mg/dL (ref 1.7–2.4)

## 2023-06-18 MED ORDER — MAGNESIUM SULFATE 2 GM/50ML IV SOLN
2.0000 g | Freq: Once | INTRAVENOUS | Status: AC
  Administered 2023-06-18: 2 g via INTRAVENOUS
  Filled 2023-06-18: qty 50

## 2023-06-18 MED ORDER — POTASSIUM CHLORIDE CRYS ER 20 MEQ PO TBCR
40.0000 meq | EXTENDED_RELEASE_TABLET | Freq: Once | ORAL | Status: AC
  Administered 2023-06-18: 40 meq via ORAL
  Filled 2023-06-18: qty 2

## 2023-06-18 MED ORDER — POTASSIUM CHLORIDE 20 MEQ PO PACK: 40.0000 meq | PACK | Freq: Once | ORAL | Status: DC

## 2023-06-18 MED ORDER — SODIUM CHLORIDE 0.9% FLUSH
10.0000 mL | Freq: Two times a day (BID) | INTRAVENOUS | Status: DC
  Administered 2023-06-18 – 2023-06-20 (×5): 10 mL

## 2023-06-18 MED ORDER — SODIUM CHLORIDE 0.9% FLUSH
10.0000 mL | INTRAVENOUS | Status: DC | PRN
  Administered 2023-06-18: 10 mL

## 2023-06-18 MED ORDER — POTASSIUM CHLORIDE 20 MEQ PO PACK: 40.0000 meq | PACK | Freq: Two times a day (BID) | ORAL | Status: DC

## 2023-06-18 MED ORDER — POTASSIUM CHLORIDE 10 MEQ/100ML IV SOLN
10.0000 meq | INTRAVENOUS | Status: AC
  Administered 2023-06-18 (×4): 10 meq via INTRAVENOUS
  Filled 2023-06-18 (×4): qty 100

## 2023-06-18 NOTE — Plan of Care (Signed)

## 2023-06-18 NOTE — Procedures (Signed)
Patient Name: Farina Probst  MRN: [id number]  Epilepsy Attending: Charlsie Quest  Referring Physician/Provider: Gust Rung, DO  Duration: 06/17/2023 2309 to 06/18/2023 2309   Patient history: 32 y.o. pregnant female, current regular marijuana smoker, with a PMHx of anemia, asthma, cannabinoid hyperemesis syndrome, hypokalemia and nephrolithiasis, who presents to the ED with new onset seizures. EEG to evaluate for seizure.   Level of alertness: Awake, asleep   AEDs during EEG study: None   Technical aspects: This EEG study was done with scalp electrodes positioned according to the 10-20 International system of electrode placement. Electrical activity was reviewed with band pass filter of 1-70Hz , sensitivity of 7 uV/mm, display speed of 32mm/sec with a 60Hz  notched filter applied as appropriate. EEG data were recorded continuously and digitally stored.  Video monitoring was available and reviewed as appropriate.   Description: The posterior dominant rhythm consists of 9 Hz activity of moderate voltage (25-35 uV) seen predominantly in posterior head regions, symmetric and reactive to eye opening and eye closing.  Sleep was characterized by vertex waves, sleep spindles (12-14Hz ), maximal fronto-central region.    IMPRESSION: This study is within normal limits. No seizures or epileptiform discharges were seen throughout the recording.   A normal interictal EEG does not exclude the diagnosis of epilepsy.     Melven Stockard [name]

## 2023-06-18 NOTE — Progress Notes (Signed)

## 2023-06-18 NOTE — Progress Notes (Addendum)
Peripherally Inserted Central Catheter Placement  The IV Nurse has discussed with the patient and/or persons authorized to consent for the patient, the purpose of this procedure and the potential benefits and risks involved with this procedure.  The benefits include less needle sticks, lab draws from the catheter, and the patient may be discharged home with the catheter. Risks include, but not limited to, infection, bleeding, blood clot (thrombus formation), and puncture of an artery; nerve damage and irregular heartbeat and possibility to perform a PICC exchange if needed/ordered by physician.  Alternatives to this procedure were also discussed.  Bard Power PICC patient education guide, fact sheet on infection prevention and patient information card has been provided to patient /or left at bedside.    PICC Placement Documentation    Unable to place PICC beyond shoulder.  Left double lumen as MIDLINE.  RN notified, dressing labeled accordingly.  Dr Modena Slater notified via secure chat.    Elliot Dally 06/18/2023, 7:21 PM

## 2023-06-18 NOTE — Progress Notes (Addendum)
Subjective: Mercedes [name] is a 32 year old female G4P3 at 7 weeks with a past medical history of asthma and hyperemesis gravidarum who presented with a witnessed seizure AM of 8/23 and again in ED.  No acute events overnight.  PRNs: Dramamine 50 mg x2, IV Zofran 4 mg x3.  Patient on IV normal saline 125 mL an hour.  No medication refusals.  On interview, patient says she feels sick. Couldn't keep her potassium pills down.  Prefers Dramamine as antinausea agent.  No changes in BMs. Says she is been having headache but is not in pain otherwise. Says she is agreeable to IV potassium, counseled on antral adverse side effects. Will attempt to get nausea under control, does not want to return to liquid diet.  Objective:  BP: 115/70.  HR: 59.  Afebrile.  Satting 99% on room air.  I/Os:+ 3.6 L: Urine output not recorded.  Vital signs in last 24 hours: Vitals:   06/18/23 0102 06/18/23 0512 06/18/23 0916 06/18/23 1115  BP: 102/66 115/70 112/68 113/65  Pulse: 62 (!) 59 72 75  Resp:   19 19  Temp: 98.7 F (37.1 C) 98.6 F (37 C) 98.2 F (36.8 C) 98.5 F (36.9 C)  TempSrc: Oral Oral Oral Oral  SpO2: 100% 99% 98% 98%  Weight:      Height:       Constitutional: Well-developed woman lying in bed, appears uncomfortable Cardiovascular: Regular rate and rhythm, no murmurs rubs or gallops Respiratory: Normal breathing on room air, CTP Abdominal: Bowel sounds present, no tenderness in 4 quadrants MSK: Regular strength in 4 quadrants Neuro: No gross deficits noted Psych: Appropriate mood and affect  Pertinent labs: Sodium: 131 (133) Potassium: 3.0 (2.9) Glucose: 83 Phosphorus: 3.1  Awaiting: Osmolality, urine osmolality, urine sodium and potassium, T3  Pertinent imaging:  EEG (8/23 - 8/24) Within normal limits, per neuro note MRI/CT negative  Assessment/Plan:  Principal Problem:   Seizure (HCC)   Mercedes [name] is a 32 y.o. [phone], pleasant female with with past medical  history of childhood asthma presented to ED for new onset seizures this morning and admitted for further workup.   #New onset seizures #Multiple bouts of vomiting #[redacted] week Pregnant  #leukocytosis #Hypokalemia  Presented with new onset seizure 8/23 morning at 6:45 AM witnessed by her fianc, who noted she was shaking in her sleep, no limb jerking or bowel/bladder incontinence, lasted for less than 1 minute with postictal state. Patient had another seizure in ED after her head CT with similar episode as this morning, her eyes rolled back, shaking. No limb jerking or bowel/bladder incontinence. Lasted < 1 min with post-ictal state with associated symptoms of blurry vision and headaches. Pt denied any previous episodes or history of seizures. Does report history of seizure in mother and in daughter, no other family members. Neurology consulted in ED. Differentials include electrolyte abnormalities vs structural vs toxins vs hyperemesis gravidarum versus hyperemesis cannabinoid syndrome. Initial labs showed sodium 136, potassium 3.1, creatinine 0.78, Mg 2.4. Hemoglobin 13.7, WBC 11.1. Urine tox positive THC. Beta HcG 53,545.  TSH 0.254, CK22, free T41.18, T3 pending, phosphorus 3.4. EEG so far negative for any seizures. MR Brain negative for intracranial abnormality, but shows 2-weighted signal within the white matter.    8/25: Off IVF this morning. I/Os: +3.6L, urine unmeasured.  Potassium 3.0 (2.9). Sodium: 131 (133).  Unfortunately, patient is unable to keep down p.o. potassium supplementation.  Patient amenable to IV potassium, even though it may burn somewhat.  Serum osmolality just under normal at 274.  We will continue PRN Dramamine/Zofran for nausea and vomiting.  Per neuro, long-term EEG monitoring 8/24-28/25 was within normal limits.  Seizures likely related to electrolyte derangements, neurology will discontinue long-term monitoring and sign off.  We will try to patient comfortable by getting  nausea/vomiting under control over next 24 hours correcting electrolyte derangements.  -Now off IV fluids -Begin IV potassium 10 mEq every hour x 6 doses -Folic acid 1 g daily -Tylenol 650 mg oral every 8 hours as needed for headaches -Zofran IV 4 mg every 8 hours as needed for nausea and vomiting -Doxylamine 25 mg and Pyridoxine 25 mg, daily at bedtime for nausea -MiraLAX as needed for constipation -Seizure prophylaxis  -Schedule neurology follow-up before discharge   #Asymptomatic bacteriuria UA showed moderate bilirubin, ketones 80, Protein 100, and rare bacteria.  Patient denies any symptoms of dysuria or increase in frequency. Urine cultures pending. -Fofomycin 3g one dose  Prior to Admission Living Arrangement: Home Anticipated Discharge Location: Home Barriers to Discharge: Electrolyte derangements Dispo: Anticipated discharge in approximately 1-2 day(s).   Tomie China, MD 06/18/2023, 4:55 PM Pager: [phone]  After 5pm on weekdays and 1pm on weekends: On Call pager [phone]

## 2023-06-18 NOTE — Progress Notes (Addendum)
Neurology Progress Note   S:// Patient is laying in the bed in NAD. No family at the bedside. Patient on LTM.  No new neurological events overnight.   O:// Current vital signs: BP 112/68 (BP Location: Left Arm)   Pulse 72   Temp 98.2 F (36.8 C) (Oral)   Resp 19   Ht 5\' 4"  (1.626 m)   Wt 111.1 kg   LMP  (LMP Unknown)   SpO2 98%   BMI 42.05 kg/m  Vital signs in last 24 hours: Temp:  [97.9 F (36.6 C)-98.8 F (37.1 C)] 98.2 F (36.8 C) (08/25 0916) Pulse Rate:  [59-73] 72 (08/25 0916) Resp:  [17-19] 19 (08/25 0916) BP: (102-122)/(66-81) 112/68 (08/25 0916) SpO2:  [98 %-100 %] 98 % (08/25 0916)  GENERAL: Awake, alert, in NAD HEENT: - Normocephalic and atraumatic, dry mm LUNGS - Clear to auscultation bilaterally with no wheezes CV - S1S2 RRR, no m/r/g, equal pulses bilaterally. ABDOMEN - Soft, nontender, nondistended with normoactive BS Ext: warm, well perfused, intact peripheral pulses, no edema  NEURO:  Mental Status: AA&Ox3  Language: speech is clear.  Naming, repetition, fluency, and comprehension intact. Cranial Nerves: PERRL EOMI, visual fields full, no facial asymmetry, facial sensation intact, hearing intact, tongue/uvula/soft palate midline, normal sternocleidomastoid and trapezius muscle strength. No evidence of tongue atrophy  Motor: 5/5 in all 4 extremities Tone is normal and bulk is normal Sensation- Intact to light touch bilaterally Coordination: FTN intact bilaterally, no ataxia in BLE. Gait- Deferred  Medications  Current Facility-Administered Medications:    acetaminophen (TYLENOL) tablet 650 mg, 650 mg, Oral, Q8H, Patel, Amar, DO, 650 mg at 06/18/23 0543   dimenhyDRINATE (DRAMAMINE) tablet 50 mg, 50 mg, Oral, Q8H PRN, Modena Slater, DO, 50 mg at 06/18/23 0201   doxylamine (Sleep) (UNISOM) tablet 25 mg, 25 mg, Oral, QHS, Patel, Amar, DO, 25 mg at 06/17/23 2116   enoxaparin (LOVENOX) injection 50 mg, 50 mg, Subcutaneous, Q24H, Tawkaliyar, Roya, DO, 50  mg at 06/17/23 1654   folic acid (FOLVITE) tablet 1 mg, 1 mg, Oral, Daily, Allena Katz, Amar, DO, 1 mg at 06/18/23 0931   ondansetron (ZOFRAN) injection 4 mg, 4 mg, Intravenous, Q8H PRN, Modena Slater, DO, 4 mg at 06/18/23 0204   polyethylene glycol (MIRALAX / GLYCOLAX) packet 17 g, 17 g, Oral, Daily PRN, Modena Slater, DO   potassium chloride (KLOR-CON) packet 40 mEq, 40 mEq, Oral, Once, Allena Katz, Amar, DO   potassium chloride 10 mEq in 100 mL IVPB, 10 mEq, Intravenous, Q1 Hr x 6, Patel, Amar, DO, Last Rate: 100 mL/hr at 06/18/23 0932, 10 mEq at 06/18/23 0932   pyridOXINE (VITAMIN B6) tablet 25 mg, 25 mg, Oral, QHS, Patel, Amar, DO, 25 mg at 06/17/23 2116  Labs CBC    Component Value Date/Time   WBC 9.4 06/17/2023 0602   RBC 3.98 06/17/2023 0602   HGB 12.0 06/17/2023 0602   HGB 9.5 (L) 06/08/2017 1536   HCT 34.7 (L) 06/17/2023 0602   HCT 27.4 (L) 06/08/2017 1536   PLT 168 06/17/2023 0602   PLT 188 06/08/2017 1536   MCV 87.2 06/17/2023 0602   MCV 85 06/08/2017 1536   MCH 30.2 06/17/2023 0602   MCHC 34.6 06/17/2023 0602   RDW 12.7 06/17/2023 0602   RDW 13.7 06/08/2017 1536   LYMPHSABS 1.5 06/16/2023 0844   MONOABS 0.9 06/16/2023 0844   EOSABS 0.0 06/16/2023 0844   BASOSABS 0.0 06/16/2023 0844    CMP     Component Value Date/Time  NA 131 (L) 06/18/2023 0354   NA 137 06/08/2017 1536   K 3.0 (L) 06/18/2023 0354   CL 101 06/18/2023 0354   CO2 23 06/18/2023 0354   GLUCOSE 83 06/18/2023 0354   BUN <5 (L) 06/18/2023 0354   BUN 9 06/08/2017 1536   CREATININE 0.63 06/18/2023 0354   CALCIUM 8.4 (L) 06/18/2023 0354   PROT 6.2 (L) 06/18/2023 0354   ALBUMIN 3.0 (L) 06/18/2023 0354   AST 11 (L) 06/18/2023 0354   ALT 9 06/18/2023 0354   ALKPHOS 67 06/18/2023 0354   BILITOT 1.2 06/18/2023 0354   GFRNONAA >60 06/18/2023 0354   GFRAA >60 05/30/2019 0458    Lipid Panel  No results found for: "CHOL", "TRIG", "HDL", "CHOLHDL", "VLDL", "LDLCALC", "LDLDIRECT"  No results found for: "HGBA1C"    Imaging I have reviewed images in epic and the results pertinent to this consultation are:  CT-scan of the brain- no acute process   MRI examination of the brain- no acute process  EEG normal  LTM EEG so far is normal   TSH low at 0.254 Free T4 elevated at 1.18 T3 pending  NA 133 K 2.9 Pregnancy test positive  Assessment: 32 y.o. pregnant female, current regular marijuana smoker, with a PMHx of anemia, asthma, cannabinoid hyperemesis syndrome, hypokalemia and nephrolithiasis, who presents to the ED with new onset seizures  - Exam continues to be nonfocal - No seizure-like events for 48 hours while on LTM EEG - Na 131 and K 3.0 this morning - LTM EEG (06/16/2023 2309 to 06/17/2023 0557):The posterior dominant rhythm consists of 9 Hz activity of moderate voltage (25-35 uV) seen predominantly in posterior head regions, symmetric and reactive to eye opening and eye closing.  Sleep was characterized by vertex waves, sleep spindles (12-14Hz ), maximal fronto-central region. This study is within normal limits. No seizures or epileptiform discharges were seen throughout the recording. LTM EEG 8/24-8/25: Within normal limits.  - Overall impression: New onset of epileptic seizures versus pseudoseizures  Recommendations: - Correct electrolytes per primary team - Will discontinue LTM today - As a spell has not been captured during this admission, epileptic seizures cannot be ruled in or ruled out. She may benefit from ambulatory EEG monitoring for several days, which can be obtained outpatient - OK to discharge home from Neurology standpoint, if OK with primary team and OB team.  - Seizure precautions. Discussed at length with patient about driving restrictions for 6 months or until cleared by physician - She has been advised to discontinue cannabis use. Good sleep hygiene is advised.  - Will need outpatient follow up with neurology after discharge.  - Neurology will sign off. Please call with  questions or concerns   SEIZURE PRECAUTIONS Per [facility] statutes, patients with seizures are not allowed to drive until they have been seizure-free for six months. Use caution when using heavy equipment or power tools. Avoid working on ladders or at heights. Take showers instead of baths. Ensure the water temperature is not too high on the home water heater. Do not go swimming alone. Do not lock yourself in a room alone (i.e. bathroom). When caring for infants or small children, sit down when holding, feeding, or changing them to minimize risk of injury to the child in the event you have a seizure. Maintain good sleep hygiene. Avoid alcohol.    If patient has another seizure, call 911 and bring them back to the ED if: A.  The seizure lasts longer than 5  minutes.      B.  The patient doesn't wake shortly after the seizure or has new problems such as difficulty seeing, speaking or moving following the seizure C.  The patient was injured during the seizure D.  The patient has a temperature over 102 F (39C) E.  The patient vomited during the seizure and now is having trouble breathing  Gevena Mart DNP, ACNPC-AG  [facility]   Electronically signed: Dr. Caryl Pina

## 2023-06-18 NOTE — Plan of Care (Signed)

## 2023-06-18 NOTE — Progress Notes (Signed)
Dear Doctor: Mikey Bussing This patient has been identified as a candidate for PICC for the following reason (s): poor veins/poor circulatory system (CHF, COPD, emphysema, diabetes, steroid use, IV drug abuse, etc.) If you agree, please write an order for the indicated device. For any questions contact the Vascular Access Team at [phone] if no answer, please leave a message.  Thank you for supporting the early vascular access assessment program.

## 2023-06-19 ENCOUNTER — Inpatient Hospital Stay (HOSPITAL_COMMUNITY): Payer: Medicaid Other

## 2023-06-19 DIAGNOSIS — R569 Unspecified convulsions: Secondary | ICD-10-CM | POA: Diagnosis not present

## 2023-06-19 LAB — BASIC METABOLIC PANEL
Anion gap: 8 (ref 5–15)
BUN: 5 mg/dL — ABNORMAL LOW (ref 6–20)
CO2: 23 mmol/L (ref 22–32)
Calcium: 9 mg/dL (ref 8.9–10.3)
Chloride: 102 mmol/L (ref 98–111)
Creatinine, Ser: 0.71 mg/dL (ref 0.44–1.00)
GFR, Estimated: >60 mL/min (ref >60)
Glucose, Bld: 92 mg/dL (ref 70–99)
Potassium: 3.8 mmol/L (ref 3.5–5.1)
Sodium: 133 mmol/L — ABNORMAL LOW (ref 135–145)

## 2023-06-19 LAB — GLUCOSE, CAPILLARY: Glucose-Capillary: 118 mg/dL — ABNORMAL HIGH (ref 70–99)

## 2023-06-19 LAB — OSMOLALITY, URINE: Osmolality, Ur: 629 mosm/kg (ref 300–900)

## 2023-06-19 MED ORDER — LORAZEPAM 2 MG/ML IJ SOLN
INTRAMUSCULAR | Status: AC
  Filled 2023-06-19: qty 1

## 2023-06-19 NOTE — Plan of Care (Signed)
  Problem: Education: Goal: Knowledge of General Education information will improve Description: Including pain rating scale, medication(s)/side effects and non-pharmacologic comfort measures Outcome: Progressing   Problem: Health Behavior/Discharge Planning: Goal: Ability to manage health-related needs will improve Outcome: Progressing   Problem: Clinical Measurements: Goal: Ability to maintain clinical measurements within normal limits will improve Outcome: Progressing Goal: Will remain free from infection Outcome: Progressing Goal: Diagnostic test results will improve Outcome: Progressing Goal: Respiratory complications will improve Outcome: Progressing Goal: Cardiovascular complication will be avoided Outcome: Progressing   Problem: Activity: Goal: Risk for activity intolerance will decrease Outcome: Progressing   Problem: Nutrition: Goal: Adequate nutrition will be maintained Outcome: Progressing   Problem: Coping: Goal: Level of anxiety will decrease Outcome: Progressing   Problem: Elimination: Goal: Will not experience complications related to bowel motility Outcome: Progressing Goal: Will not experience complications related to urinary retention Outcome: Progressing   Problem: Pain Managment: Goal: General experience of comfort will improve Outcome: Progressing   Problem: Safety: Goal: Ability to remain free from injury will improve Outcome: Progressing   Problem: Skin Integrity: Goal: Risk for impaired skin integrity will decrease Outcome: Progressing   Problem: Coping: Goal: Ability to adjust to condition or change in health will improve Outcome: Progressing   Problem: Medication: Goal: Risk for medication side effects will decrease Outcome: Progressing   Problem: Clinical Measurements: Goal: Diagnostic test results will improve Outcome: Progressing   Problem: Safety: Goal: Verbalization of understanding the information provided will  improve Outcome: Progressing   Problem: Self-Concept: Goal: Level of anxiety will decrease Outcome: Progressing

## 2023-06-19 NOTE — Progress Notes (Signed)
LTM EEG discontinued - no skin breakdown at unhook.   

## 2023-06-19 NOTE — Progress Notes (Addendum)
Subjective: Did not have any episodes for more than 2 days and therefore EEG was discontinued at around 930 this morning.  Around 10 AM and RN messaged me that patient had another seizure-like episode associated with bladder incontinence.  Patient reports she is having felt like she was on a "roller coaster" and then does not remember anything.  Per fianc, she had stiffening of bilateral upper extremities with flexion of the right wrist, tremor-like movements as well as bladder incontinence lasting for less than a minute.  Denies any family history of epilepsy.  Has a 48-year-old daughter has had 2 seizure-like episodes but not on any antiseizure medications  ROS: negative except above Examination  Vital signs in last 24 hours: Temp:  [98 F (36.7 C)-99.3 F (37.4 C)] 98 F (36.7 C) (08/26 1006) Pulse Rate:  [57-65] 65 (08/26 1006) Resp:  [16] 16 (08/26 0731) BP: (110-135)/(65-83) 120/79 (08/26 1006) SpO2:  [100 %] 100 % (08/26 1006)  General: lying in bed, not in apparent distress Neuro: MS: Alert, oriented, follows commands CN: pupils equal and reactive,  EOMI, face symmetric, tongue midline, normal sensation over face, Motor: 5/5 strength in all 4 extremities Coordination: normal Gait: not tested  Basic Metabolic Panel: Recent Labs  Lab 06/16/23 0844 06/17/23 0602 06/18/23 0354 06/19/23 0330  NA 136 133* 131* 133*  K 3.1* 2.9* 3.0* 3.8  CL 96* 98 101 102  CO2 25 24 23 23   GLUCOSE 99 97 83 92  BUN 15 9 <5* 5*  CREATININE 0.78 0.64 0.63 0.71  CALCIUM 9.3 8.6* 8.4* 9.0  MG 2.4  --  1.8  --   PHOS  --  3.4 3.1  --     CBC: Recent Labs  Lab 06/16/23 0844 06/17/23 0602  WBC 11.1* 9.4  NEUTROABS 8.7*  --   HGB 13.7 12.0  HCT 41.4 34.7*  MCV 87.7 87.2  PLT 213 168     Coagulation Studies: No results for input(s): "LABPROT", "INR" in the last 72 hours.  Imaging MRI brain without contrast 06/16/2023: No acute abnormality.  ASSESSMENT AND PLAN: 32 year old  pregnant female, regular marijuana smoker presented with new onset seizure-like activity.  Seizure-like activity -No seizures overnight.  Therefore activity was discontinued this morning.  Unfortunately patient had seizure-like episode immediately after discontinuing LTM EEG.  Recommendations -As patient is still in the hospital, we will rehook to LTM EEG.  Also will perform hyperventilation and photic stimulation -However with completely normal EEG for 48 hours, and this episode happening immediately after discontinuing EEG, there is suspicion that episodes are most likely nonepileptic.  Additionally, all antiseizure medications are at least category C during pregnancy.  Therefore I would be hesitant to start any medications unless we see abnormality on EEG -Discussed the diagnosis of epileptic seizures versus nonepileptic events -Continue seizure precautions -Discussed plan with medicine team by secure chat as well as family at bedside  I have spent a total of   38 minutes with the patient reviewing hospital notes,  test results, labs and examining the patient as well as establishing an assessment and plan that was discussed personally with the patient.  > 50% of time was spent in direct patient care.   Lindie Spruce Epilepsy [facility] For questions after 5pm please refer to AMION to reach the Neurologist on call

## 2023-06-19 NOTE — Procedures (Addendum)
Patient Name: Mercedes [name]  MRN: [id number]  Epilepsy Attending: Charlsie Quest  Referring Physician/Provider: Gust Rung, DO  Duration: 06/19/2023 1034 to 06/20/2023 1034   Patient history: 32 y.o. pregnant female, current regular marijuana smoker, with a PMHx of anemia, asthma, cannabinoid hyperemesis syndrome, hypokalemia and nephrolithiasis, who presents to the ED with new onset seizures. EEG to evaluate for seizure.   Level of alertness: Awake, asleep   AEDs during EEG study: None   Technical aspects: This EEG study was done with scalp electrodes positioned according to the 10-20 International system of electrode placement. Electrical activity was reviewed with band pass filter of 1-70Hz , sensitivity of 7 uV/mm, display speed of 33mm/sec with a 60Hz  notched filter applied as appropriate. EEG data were recorded continuously and digitally stored.  Video monitoring was available and reviewed as appropriate.   Description: The posterior dominant rhythm consists of 9 Hz activity of moderate voltage (25-35 uV) seen predominantly in posterior head regions, symmetric and reactive to eye opening and eye closing.  Sleep was characterized by vertex waves, sleep spindles (12-14Hz ), maximal fronto-central region. Photic driving was seen during photic stimulation. No EEG change was seen during hyperventilation.   IMPRESSION: This study is within normal limits. No seizures or epileptiform discharges were seen throughout the recording.   A normal interictal EEG does not exclude the diagnosis of epilepsy.     Mercedes [name] [name]

## 2023-06-19 NOTE — Progress Notes (Signed)
vLTM started   All impedances below 10kohms  Atrium monitoring   Completed  Photic and HV   Instructed pt on use of event button

## 2023-06-19 NOTE — Progress Notes (Addendum)
HD#2 SUBJECTIVE:  Patient Summary: Mercedes [name] is a 32 year old female who is [redacted] weeks gestation with her fourth child with PMH significant for asthma and hyperemesis gravidarum in her previous pregnancy. She presented to hospital after having a seizure around 6:45AM on 8/23 in her sleep witnessed by her fiance, admitted for further work up.    Overnight Events: None  Interim History: Pt evaluated at bedside after having an episode of seizure this AM around 10 AM, fiance at bedside. Pt reports she had a feeling like "going down on a roller coaster" and then had a headache, does not remember afterwards. Fiance at bedside notes that she started shaking with her fists clenched, arms extended in a rigid motion. She had a bladder incontinence. Fiance reports seizure duration <1 min, it took her 20 seconds to come back to baseline. Reports headaches and feeling like her limbs were heavy.   Pt was re-evaluated later in the afternoon around 2 PM. Patient reports overall doing well. Patient reports she has a good family support with her fiance, siblings and parents. She reports normal stressors such as her daughters, no other out of ordinary extreme stressors. She feels safe at home with her kids and fiance. She does not report any traumatic event. Her PHQ 9 was 2.   OBJECTIVE:  Vital Signs: Vitals:   06/18/23 2318 06/19/23 0327 06/19/23 0731 06/19/23 1006  BP: 135/82 112/65 119/66 120/79  Pulse: (!) 58 (!) 57 64 65  Resp: 16 16 16    Temp: 98.3 F (36.8 C) 98.1 F (36.7 C) 99.3 F (37.4 C) 98 F (36.7 C)  TempSrc: Oral Oral Oral Oral  SpO2: 100% 100% 100% 100%  Weight:      Height:       Supplemental O2: Room Air SpO2: 100 %  Filed Weights   06/16/23 0815  Weight: 111.1 kg    No intake or output data in the 24 hours ending 06/19/23 1414 Net IO Since Admission: 3,699.01 mL [06/19/23 1414]  Physical Exam: Physical Exam  Constitutional: Appears tired, no acute distress.  HENT:  normocephalic atraumatic, with EEG leads on head.  Cardiovascular: regular rate and rhythm, no m/r/g.  No lower extremity edema bilaterally. Pulmonary/Chest: normal work of breathing on room air, lungs clear to auscultation bilaterally Abdominal: soft, non-distended, non tender. Bowel sounds present Neurological: alert & oriented x 3, no focal deficits.  Skin: warm and dry Psych: Normal mood and effect. PHQ 9 is 2    Patient Lines/Drains/Airways Status     Active Line/Drains/Airways     Name Placement date Placement time Site Days   Midline Single Lumen 06/18/23 Right Brachial 14 cm 0 cm 06/18/23  1918  Brachial  1             ASSESSMENT/PLAN:  Assessment: Principal Problem:   Seizure (HCC)  #New onset seizures #Multiple bouts of vomiting #[redacted] week Pregnant  #leukocytosis - resolved  #Hypokalemia - resolved Presented with new onset seizure 8/23 morning at 6:45 AM witnessed by her fianc, who noted she was shaking in her sleep, no limb jerking or bowel/bladder incontinence, lasted for less than 1 minute with postictal state. Patient had another seizure in ED after her head CT with similar episode as that morning, her eyes rolled back, shaking. No limb jerking or bowel/bladder incontinence. Lasted < 1 min with post-ictal state with associated symptoms of blurry vision and headaches. Pt denied any previous episodes or history of seizures. Does report history of  seizure in mother and in daughter, no other family members. Neurology consulted in ED. Differentials include electrolyte abnormalities vs structural vs toxins vs hyperemesis gravidarum versus hyperemesis cannabinoid syndrome. Initial labs showed sodium 136, potassium 3.1, creatinine 0.78, Mg 2.4. Hemoglobin 13.7, WBC 11.1. Urine tox positive THC. Beta HcG 53,545. TSH 0.254, CK22, free T41.18, T3 107, phosphorus 3.4. Pt was given fluids and potassium supplementations. EEG negative for any seizures. MR Brain negative for intracranial  abnormality, but shows 2-weighted signal within the white matter. LTM EEG 48 hours 8/24-8/25 within normal limits. Per neurology, patient to follow up outpatient ambulatory EEG.   In addition, patient presented with four days of nausea and vomiting likely secondary to hyperemesis gravidarum versus hyperemesis cannabinoid syndrome. She was started on Folic acid 1 g daily, Tylenol 650 mg oral every 8 hours as needed for headaches, Zofran IV 4 mg every 8 hours as needed for nausea and vomiting, with addition to Doxylamine 25 mg and Pyridoxine 25 mg, daily at bedtime for nausea.  Patient experienced another witnessed seizure like activity this AM roughly around 10 AM, fiance at bedside described it as shaking, tensed with extended arms and clenched fists with bladder incontinence. Lasted for less than 1 min. Post-ictal with associated symptoms of headaches. Patient denies any major stressors and traumatic events. She reports good family support consisting of parents, siblings, and fiance. She feels safe at home. Her PHQ 9 is 2. 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 Seizures unlikely due to electrical disturbance or metabolic disturbances. Per Neuro, will continue LTM EEG today.    -Folic acid 1 g daily -Tylenol 650 mg oral every 8 hours as needed for headaches -Zofran IV 4 mg every 8 hours as needed for nausea and vomiting -Doxylamine 25 mg and Pyridoxine 25 mg, daily at bedtime for nausea -Dramamine 50 mg, PO, every 8 hours as needed for nausea -MiraLAX as needed for constipation -Seizure prophylaxis  -Follow neurology recommendations   #Asymptomatic bacteriuria UA showed moderate bilirubin, ketones 80, Protein 100, and rare bacteria.  Patient denies any symptoms of dysuria or increase in frequency. Finished one dose of Fofomycin 3g.  Best Practice: Diet: Regular diet IVF: Fluids: None VTE: Enoxaparin  Code: Full AB: None Therapy Recs: None, DME: none Family Contact: Fiance, at  bedside. DISPO: Anticipated discharge  1-2 days  to Home pending  medical management .  Signature: Jeral Pinch, D.O.  Internal Medicine Resident, PGY-1 [facility]  Pager: [phone] 2:14 PM, 06/19/2023   Please contact the on call pager after 5 pm and on weekends at [phone].

## 2023-06-19 NOTE — Progress Notes (Signed)
[id number] This RN called into pt's room by husband. Pt experiencing seizure like activity. MD notified. Seizure like activity lasted less than 30 seconds and pt had episode of incontinence. Pt post-ictal. Pt A&O to person and place. VSS. NAD. Seizure precautions continued. See orders for long term EEG.

## 2023-06-20 DIAGNOSIS — R569 Unspecified convulsions: Secondary | ICD-10-CM | POA: Diagnosis not present

## 2023-06-20 DIAGNOSIS — R1110 Vomiting, unspecified: Secondary | ICD-10-CM | POA: Diagnosis not present

## 2023-06-20 DIAGNOSIS — Z3A01 Less than 8 weeks gestation of pregnancy: Secondary | ICD-10-CM | POA: Diagnosis not present

## 2023-06-20 LAB — CBC
HCT: 38.3 % (ref 36.0–46.0)
Hemoglobin: 13.2 g/dL (ref 12.0–15.0)
MCH: 30.1 pg (ref 26.0–34.0)
MCHC: 34.5 g/dL (ref 30.0–36.0)
MCV: 87.2 fL (ref 80.0–100.0)
Platelets: 177 10*3/uL (ref 150–400)
RBC: 4.39 MIL/uL (ref 3.87–5.11)
RDW: 12.4 % (ref 11.5–15.5)
WBC: 10.1 10*3/uL (ref 4.0–10.5)
nRBC: 0 % (ref 0.0–0.2)

## 2023-06-20 LAB — BASIC METABOLIC PANEL
Anion gap: 9 (ref 5–15)
BUN: 7 mg/dL (ref 6–20)
CO2: 21 mmol/L — ABNORMAL LOW (ref 22–32)
Calcium: 9 mg/dL (ref 8.9–10.3)
Chloride: 101 mmol/L (ref 98–111)
Creatinine, Ser: 0.79 mg/dL (ref 0.44–1.00)
GFR, Estimated: >60 mL/min (ref >60)
Glucose, Bld: 92 mg/dL (ref 70–99)
Potassium: 4.2 mmol/L (ref 3.5–5.1)
Sodium: 131 mmol/L — ABNORMAL LOW (ref 135–145)

## 2023-06-20 NOTE — Progress Notes (Addendum)
Subjective: No acute events overnight.  No new seizure-like episodes.  ROS: negative except above  Examination  Vital signs in last 24 hours: Temp:  [98.1 F (36.7 C)-98.7 F (37.1 C)] 98.7 F (37.1 C) (08/27 0815) Pulse Rate:  [66-80] 73 (08/27 0815) Resp:  [18-20] 18 (08/27 0815) BP: (99-117)/(50-78) 108/63 (08/27 0815) SpO2:  [100 %] 100 % (08/27 0815)  General: lying in bed, not in apparent distress Neuro: MS: Alert, oriented, follows commands CN: pupils equal and reactive,  EOMI, face symmetric, tongue midline, normal sensation over face, Motor: 5/5 strength in all 4 extremities Coordination: normal Gait: not tested  Basic Metabolic Panel: Recent Labs  Lab 06/16/23 0844 06/17/23 0602 06/18/23 0354 06/19/23 0330 06/20/23 0748  NA 136 133* 131* 133* 131*  K 3.1* 2.9* 3.0* 3.8 4.2  CL 96* 98 101 102 101  CO2 25 24 23 23  21*  GLUCOSE 99 97 83 92 92  BUN 15 9 <5* 5* 7  CREATININE 0.78 0.64 0.63 0.71 0.79  CALCIUM 9.3 8.6* 8.4* 9.0 9.0  MG 2.4  --  1.8  --   --   PHOS  --  3.4 3.1  --   --     CBC: Recent Labs  Lab 06/16/23 0844 06/17/23 0602 06/20/23 0748  WBC 11.1* 9.4 10.1  NEUTROABS 8.7*  --   --   HGB 13.7 12.0 13.2  HCT 41.4 34.7* 38.3  MCV 87.7 87.2 87.2  PLT 213 168 177     Coagulation Studies: No results for input(s): "LABPROT", "INR" in the last 72 hours.  Imaging No new brain imaging overnight   ASSESSMENT AND PLAN: 32 year old pregnant female, regular marijuana smoker presented with new onset seizure-like activity.   Seizure-like activity -No seizures overnight.    Recommendations -Continue EEG monitoring at least for 24 more hours to look for any interictal activity, seizures -Will not start antiseizure medications unless definite epileptiform activity on EEG.  However did discuss that if patient is concerned, can consider starting low-dose lamotrigine as it has the best evidence for safety during pregnancy -Continue seizure  precautions -Discussed plan with medicine team by secure chat as well as family at bedside   I have spent a total of   26  minutes with the patient reviewing hospital notes,  test results, labs and examining the patient as well as establishing an assessment and plan that was discussed personally with the patient.  > 50% of time was spent in direct patient care.        Lindie Spruce Epilepsy [facility] For questions after 5pm please refer to AMION to reach the Neurologist on call

## 2023-06-20 NOTE — TOC Initial Note (Signed)
Transition of Care [facility]) - Initial/Assessment Note    Patient Details  Name: Mercedes [name] MRN: [id number] Date of Birth: [birth year]  Transition of Care [facility]) CM/SW Contact:    Kermit Balo, RN Phone Number: 06/20/2023, 10:44 AM  Clinical Narrative:                 Pt is from home with her fiance and children. She is alone during the daytime when SO is at work and children in school.  No DME at home.  She was only taking medications for asthma prior. She feels she can manage medications at home without issues. SO does the driving.  No PCP. CM has asked [facility] to pick her up in their office.  TOC following.    Expected Discharge Plan: Home/Self Care     Patient Goals and CMS Choice            Expected Discharge Plan and Services   Discharge Planning Services: CM Consult   Living arrangements for the past 2 months: Single Family Home                                      Prior Living Arrangements/Services Living arrangements for the past 2 months: Single Family Home Lives with:: Significant Other, Minor Children Patient language and need for interpreter reviewed:: Yes Do you feel safe going back to the place where you live?: Yes            Criminal Activity/Legal Involvement Pertinent to Current Situation/Hospitalization: No - Comment as needed  Activities of Daily Living Home Assistive Devices/Equipment: None ADL Screening (condition at time of admission) Patient's cognitive ability adequate to safely complete daily activities?: Yes Is the patient deaf or have difficulty hearing?: No Does the patient have difficulty seeing, even when wearing glasses/contacts?: No Does the patient have difficulty concentrating, remembering, or making decisions?: No Patient able to express need for assistance with ADLs?: Yes Does the patient have difficulty dressing or bathing?: No Independently performs ADLs?: Yes (appropriate for developmental age) Does the  patient have difficulty walking or climbing stairs?: No Weakness of Legs: None Weakness of Arms/Hands: None  Permission Sought/Granted                  Emotional Assessment Appearance:: Appears stated age Attitude/Demeanor/Rapport: Engaged Affect (typically observed): Accepting Orientation: : Oriented to Self, Oriented to Place, Oriented to  Time, Oriented to Situation   Psych Involvement: No (comment)  Admission diagnosis:  Seizure (HCC) [R56.9] Hyperemesis gravidarum with dehydration [O21.1] Patient Active Problem List   Diagnosis Date Noted   Seizure (HCC) 06/16/2023   Multifocal pneumonia 12/11/2021   Thrombocytopenia (HCC) 12/11/2021   Influenza A with pneumonia 12/10/2021   Obesity, Class III, BMI 40-49.9 (morbid obesity) (HCC) 12/10/2021   Tobacco abuse 12/10/2021   SVD (spontaneous vaginal delivery) 08/08/2017   Post-dates pregnancy 08/06/2017   Supervision of high risk pregnancy, antepartum 06/08/2017   Insufficient prenatal care in third trimester 06/08/2017   Substance abuse affecting pregnancy in third trimester, antepartum (HCC) 06/08/2017   Obesity in pregnancy with antepartum complication 06/08/2017   Obesity (BMI 30-39.9) 06/08/2017   Nausea and vomiting of pregnancy, antepartum 06/08/2017   Asthma exacerbation 02/27/2016   Anemia in pregnancy 02/27/2016   Hypokalemia 02/27/2016   Asthma affecting pregnancy, antepartum 02/27/2016   PCP:  Patient, No Pcp Per Pharmacy:   [id number] -  [place], [place] - [address] AT [facility] [address] [place] [address] Phone: [phone] Fax: [phone]  [id number] - [place], [place] - [address] AT [facility] [address] [place] [address] Phone: [phone] Fax: [phone]     Social Determinants of Health (SDOH) Social History: SDOH Screenings   Food Insecurity: No Food Insecurity (06/16/2023)  Housing: Low  Risk  (06/16/2023)  Transportation Needs: No Transportation Needs (06/16/2023)  Utilities: Not At Risk (06/16/2023)  Tobacco Use: Medium Risk (06/16/2023)   SDOH Interventions:     Readmission Risk Interventions     No data to display

## 2023-06-20 NOTE — Progress Notes (Addendum)
HD#3 SUBJECTIVE:  Patient Summary: Mercedes [name] is a 32 year old female who is [redacted] weeks gestation with her fourth child with PMH significant for asthma and hyperemesis gravidarum in her previous pregnancy. She presented to hospital after having a seizure around 6:45AM on 8/23 in her sleep witnessed by her fiance, admitted for further work up.    Overnight Events: None  Interim History: Patient evaluated at bedside, laying comfortably. No acute concerns at this time.  Patient denies any chest pain, shortness of breath, abdominal pain. Patient reports nausea and vomiting is worse between 3 AM to 6 AM in the morning which gets better after she receives her medications.  Reports nausea and vomiting is better throughout the day.  Reports lightheadedness this morning however it has resolved currently.  Reports no urinary symptoms.  Reports bowel movements.  Patient reports she is ready to go home.  OBJECTIVE:  Vital Signs: Vitals:   06/19/23 1945 06/19/23 2359 06/20/23 0332 06/20/23 0815  BP: 116/70 116/75 (!) 99/50 108/63  Pulse: 66 80 75 73  Resp: 20 18 18 18   Temp: 98.3 F (36.8 C) 98.4 F (36.9 C) 98.1 F (36.7 C) 98.7 F (37.1 C)  TempSrc: Oral Oral Oral Oral  SpO2: 100% 100% 100% 100%  Weight:      Height:       Supplemental O2: Room Air SpO2: 100 %  Filed Weights   06/16/23 0815  Weight: 111.1 kg     Intake/Output Summary (Last 24 hours) at 06/20/2023 1125 Last data filed at 06/19/2023 2100 Gross per 24 hour  Intake 954 ml  Output --  Net 954 ml   Net IO Since Admission: 4,653.01 mL [06/20/23 1125]  Physical Exam: Physical Exam  Constitutional: Appears well, laying comfortably in bed, no acute distress. HENT: normocephalic atraumatic, with EEG leads on head.  Cardiovascular: regular rate and rhythm, no m/r/g.  No lower extremity edema bilaterally. Pulmonary/Chest: normal work of breathing on room air, lungs clear to auscultation bilaterally Abdominal: soft,  non-distended, non tender. Bowel sounds present Neurological: alert & oriented x 3, no focal deficits.  Skin: warm and dry Psych: Normal mood and effect.  Patient Lines/Drains/Airways Status     Active Line/Drains/Airways     Name Placement date Placement time Site Days   Midline Single Lumen 06/18/23 Right Brachial 14 cm 0 cm 06/18/23  1918  Brachial  2             ASSESSMENT/PLAN:  Assessment: Principal Problem:   Seizure (HCC)   Plan: #New onset seizures #Multiple bouts of vomiting #[redacted] week Pregnant  #leukocytosis - resolved  Presented with new onset seizure 8/23 morning at 6:45 AM witnessed by her fianc, who noted she was shaking in her sleep, no limb jerking or bowel/bladder incontinence, lasted for less than 1 minute with postictal state. Patient had another seizure in ED after her head CT with similar episode as that morning, her eyes rolled back, shaking. No limb jerking or bowel/bladder incontinence. Lasted < 1 min with post-ictal state with associated symptoms of blurry vision and headaches. Pt denied any previous episodes or history of seizures. Does report history of seizure in mother and in daughter, no other family members. Neurology consulted in ED. Differentials include electrolyte abnormalities vs structural vs toxins vs hyperemesis gravidarum versus hyperemesis cannabinoid syndrome. Initial labs showed sodium 136, potassium 3.1, creatinine 0.78, Mg 2.4. Hemoglobin 13.7, WBC 11.1. Urine tox positive THC. Beta HcG 53,545. TSH 0.254, CK22, free T41.18, T3  107, phosphorus 3.4. Pt was given fluids and potassium supplementations. EEG negative for any seizures. MR Brain negative for intracranial abnormality, but shows 2-weighted signal within the white matter. LTM EEG 48 hours 8/24-8/25 within normal limits. Per neurology, patient to follow up outpatient ambulatory EEG.    In addition, patient presented with four days of nausea and vomiting likely secondary to hyperemesis  gravidarum versus hyperemesis cannabinoid syndrome. She was started on Folic acid 1 g daily, Tylenol 650 mg oral every 8 hours as needed for headaches, Zofran IV 4 mg every 8 hours as needed for nausea and vomiting, with addition to Doxylamine 25 mg and Pyridoxine 25 mg, daily at bedtime for nausea.   8/26: Patient experienced another witnessed seizure like activity this AM roughly around 10 AM, fiance at bedside described it as shaking, tensed with extended arms and clenched fists with bladder incontinence. Lasted for less than 1 min. Post-ictal with associated symptoms of headaches. Patient denies any major stressors and traumatic events. She reports good family support consisting of parents, siblings, and fiance. She feels safe at home. Her PHQ 9 is 2. 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 Seizures unlikely due to electrical disturbance or metabolic disturbances.   8/27: Patient evaluated bedside with no new concerns at this time.  No new seizures for the past 24 hours.  Nausea vomiting worse between 3 AM to 6 AM however it is resolved after receiving medications.  Per neurology will continue to monitor for seizures for another 24 hours.    -Folic acid 1 g daily -Tylenol 650 mg oral every 8 hours as needed for headaches -Zofran IV 4 mg every 8 hours as needed for nausea and vomiting -Doxylamine 25 mg and Pyridoxine 25 mg, daily at bedtime for nausea -Dramamine 50 mg, PO, every 8 hours as needed for nausea -MiraLAX as needed for constipation -Seizure prophylaxis  -Follow neurology recommendations   #Asymptomatic bacteriuria UA showed moderate bilirubin, ketones 80, Protein 100, and rare bacteria. Finished one dose of Fofomycin 3g. Patient denies any symptoms of dysuria or increase in frequency.   #Hypokalemia - resolved.  #Hyponatremia K 4.2, Na 131 from 133. Will continue to monitor.   Best Practice: Diet: Regular diet IVF: Fluids: None VTE: Lovenox injection 50 mg subcu every 24  hours Code: Full AB: None  Therapy Recs: None, DME: none DISPO: Anticipated discharge  1-2 days  to Home pending  LTM EEG .  Signature: Jeral Pinch, D.O.  Internal Medicine Resident, PGY-1 [facility]  Pager: [phone] 11:25 AM, 06/20/2023   Please contact the on call pager after 5 pm and on weekends at [phone].

## 2023-06-20 NOTE — Progress Notes (Signed)
vLTM maintenance  all imp below 10k  No skin breakdown at CZ T3 P3 A2

## 2023-06-21 ENCOUNTER — Other Ambulatory Visit (HOSPITAL_COMMUNITY): Payer: Self-pay

## 2023-06-21 DIAGNOSIS — R569 Unspecified convulsions: Secondary | ICD-10-CM | POA: Diagnosis not present

## 2023-06-21 LAB — BASIC METABOLIC PANEL
Anion gap: 14 (ref 5–15)
BUN: 9 mg/dL (ref 6–20)
CO2: 22 mmol/L (ref 22–32)
Calcium: 9.4 mg/dL (ref 8.9–10.3)
Chloride: 97 mmol/L — ABNORMAL LOW (ref 98–111)
Creatinine, Ser: 0.63 mg/dL (ref 0.44–1.00)
GFR, Estimated: >60 mL/min (ref >60)
Glucose, Bld: 95 mg/dL (ref 70–99)
Potassium: 3.8 mmol/L (ref 3.5–5.1)
Sodium: 133 mmol/L — ABNORMAL LOW (ref 135–145)

## 2023-06-21 MED ORDER — LAMOTRIGINE 25 MG PO TABS
25.0000 mg | ORAL_TABLET | Freq: Every day | ORAL | 0 refills | Status: DC
  Filled 2023-06-21: qty 98, 42d supply, fill #0

## 2023-06-21 MED ORDER — ONDANSETRON HCL 4 MG PO TABS
4.0000 mg | ORAL_TABLET | Freq: Three times a day (TID) | ORAL | Status: DC | PRN
  Administered 2023-06-21: 4 mg via ORAL
  Filled 2023-06-21: qty 1

## 2023-06-21 MED ORDER — PRENATAL MULTIVITAMIN CH
1.0000 | ORAL_TABLET | Freq: Every day | ORAL | Status: DC
  Administered 2023-06-21: 1 via ORAL
  Filled 2023-06-21: qty 1

## 2023-06-21 MED ORDER — VALTOCO 20 MG DOSE 10 MG/0.1ML NA LQPK
20.0000 mg | NASAL | 0 refills | Status: DC | PRN
  Filled 2023-06-21: qty 10, 30d supply, fill #0

## 2023-06-21 MED ORDER — PRENATAL MULTIVITAMIN CH
1.0000 | ORAL_TABLET | Freq: Every day | ORAL | 0 refills | Status: DC
  Filled 2023-06-21: qty 30, 30d supply, fill #0

## 2023-06-21 MED ORDER — FOLIC ACID 1 MG PO TABS
1.0000 mg | ORAL_TABLET | Freq: Every day | ORAL | 0 refills | Status: AC
  Filled 2023-06-21: qty 30, 30d supply, fill #0

## 2023-06-21 MED ORDER — PROMETHAZINE HCL 12.5 MG PO TABS
12.5000 mg | ORAL_TABLET | Freq: Three times a day (TID) | ORAL | 0 refills | Status: DC | PRN
  Filled 2023-06-21: qty 90, 30d supply, fill #0

## 2023-06-21 NOTE — Procedures (Addendum)
Patient Name: Mercedes [name]  MRN: [id number]  Epilepsy Attending: Charlsie Quest  Referring Physician/Provider: Gust Rung, DO  Duration: 06/20/2023 1034 to 06/20/2023 1225   Patient history: 32 y.o. pregnant female, current regular marijuana smoker, with a PMHx of anemia, asthma, cannabinoid hyperemesis syndrome, hypokalemia and nephrolithiasis, who presents to the ED with new onset seizures. EEG to evaluate for seizure.   Level of alertness: Awake, asleep   AEDs during EEG study: None   Technical aspects: This EEG study was done with scalp electrodes positioned according to the 10-20 International system of electrode placement. Electrical activity was reviewed with band pass filter of 1-70Hz , sensitivity of 7 uV/mm, display speed of 34mm/sec with a 60Hz  notched filter applied as appropriate. EEG data were recorded continuously and digitally stored.  Video monitoring was available and reviewed as appropriate.   Description: The posterior dominant rhythm consists of 9 Hz activity of moderate voltage (25-35 uV) seen predominantly in posterior head regions, symmetric and reactive to eye opening and eye closing. Sleep was characterized by vertex waves, sleep spindles (12-14Hz ), maximal fronto-central region.    IMPRESSION: This study is within normal limits. No seizures or epileptiform discharges were seen throughout the recording.   A normal interictal EEG does not exclude the diagnosis of epilepsy.     Matthew Cina [name]

## 2023-06-21 NOTE — Discharge Summary (Signed)
Name: Mercedes [name] MRN: [id number] DOB: October 17, 1991 32 y.o. PCP: Patient, No Pcp Per  Date of Admission: 06/16/2023  8:00 AM Date of Discharge:  06/21/2023 Attending Physician: Dr. Criselda Peaches  DISCHARGE DIAGNOSIS:  Primary Problem: Seizure [facility])   Hospital Problems: Principal Problem:   Seizure (HCC)    DISCHARGE MEDICATIONS:   Allergies as of 06/21/2023       Reactions   Penicillins Other (See Comments)   Reaction:  Unknown  Has patient had a PCN reaction causing immediate rash, facial/tongue/throat swelling, SOB or lightheadedness with hypotension: Unknown Has patient had a PCN reaction causing severe rash involving mucus membranes or skin necrosis: Unknown Has patient had a PCN reaction that required hospitalization: Unknown Has patient had a PCN reaction occurring within the last 10 years: No If all of the above answers are "NO", then may proceed with Cephalosporin use. Unknown reaction Childhood allergy        Medication List     STOP taking these medications    ondansetron 4 MG disintegrating tablet Commonly known as: ZOFRAN-ODT       TAKE these medications    folic acid 1 MG tablet Commonly known as: FOLVITE Take 1 tablet (1 mg total) by mouth daily.   lamoTRIgine 25 MG tablet Commonly known as: LAMICTAL Week 1 and 2: Take 1 tablet (25mg  total) by mouth daily at 12 noon. Week 3 and 4: Take 2 tablets (50mg  total) by mouth daily at 12 noon. Week 5 and 6: Take 4 tablets (100mg  total) by mouth daily at 12 noon.   prenatal multivitamin Tabs tablet Take 1 tablet by mouth daily at 12 noon.   promethazine 12.5 MG tablet Commonly known as: PHENERGAN Take 1 tablet (12.5 mg total) by mouth every 8 (eight) hours as needed for nausea or vomiting. What changed:  medication strength how much to take when to take this   Valtoco 20 MG Dose 10 MG/0.1ML Lqpk Generic drug: diazePAM (20 MG Dose) Place 20 mg into the nose as needed (seizure lasting 2 minutes  or more).        DISPOSITION AND FOLLOW-UP:  Mercedes [name] was discharged from [facility] in Good condition. At the hospital follow up visit please address:  Seizure: Patient was discharged on Lamictal 25 mg taper and Valtoco 20 mg rescue for seizures lasting greater than 2 minutes. Patient has a follow up with Dr. Teresa Coombs at [facility] neurology. Patient was also started on Folic acid 1g as she is started on ASMs. Nausea and Vomiting: Patient is discharged on Phenergan 12.5 mg tablet every 8 hours as needed for nausea and vomiting. Please titrate dose as needed for nausea and vomiting. Please re-check BMP for electrolyte disturbances.  Pregnancy: Patient is discharged on prenatal multivitamins and folic acid 1g daily. Patient has a scheduled virtual appointment with [facility] on Sept 5th for initial intake.  Follow-up Recommendations: Consults: None Labs: Basic Metabolic Profile, CBC Studies: None Medications: Lamictal 25 mg taper, Valtoco 20 mg rescue, Phenergan 12.5 mg, folic acid 1 g, prenatal multivitamins.  Follow-up Appointments:  Follow-up Information     [facility] Follow up on 06/29/2023.   Why: Virtual OB Appoitment with RN for inital intake. Contact information: [address] [place] [address] [phone]        Windell Norfolk, MD Follow up on 07/20/2023.   Specialty: Neurology Why: Hospital Follow up 4 PM Contact information: [address] [place] [address] [phone]         Annett Fabian, MD Follow up on 07/07/2023.   Specialty: Internal Medicine Why: Hospital Follow up at 10:15 AM Contact information: [address] [place] [address] [phone]                 HOSPITAL COURSE:  Patient Summary: #New onset seizures #Vomiting - improved #Cannabis use  #Leukocytosis - resolved Presented with new onset seizure 8/23 morning at  6:45 AM witnessed by her fianc, who noted she was shaking in her sleep, no limb jerking or bowel/bladder incontinence, lasted for less than 1 minute with postictal state. Patient had another seizure in ED after her head CT with similar episode as that morning, her eyes rolled back, shaking. No limb jerking or bowel/bladder incontinence. Lasted < 1 min with post-ictal state with associated symptoms of blurry vision and headaches. Pt denied any previous episodes or history of seizures. Does report history of seizure in mother and in daughter, no other family members. Neurology consulted in ED. Differentials include electrolyte abnormalities vs structural vs toxins vs hyperemesis gravidarum versus hyperemesis cannabinoid syndrome. Initial labs showed sodium 136, potassium 3.1, creatinine 0.78, Mg 2.4. Hemoglobin 13.7, WBC 11.1. Urine tox positive THC. Beta HcG 53,545. TSH 0.254, CK22, free T41.18, T3 107, phosphorus 3.4. Pt was given fluids and potassium supplementations. Initial EEG negative for any seizures. MR Brain negative for intracranial abnormality, but shows 2-weighted signal within the white matter. LTM EEG 48 hours 8/24-8/25 within normal limits.   On 8/26 Patient experienced another witnessed seizure like activity roughly around 10 AM, fiance at bedside described it as shaking, tensed with extended arms and clenched fists with bladder incontinence. Lasted for less than 1 min. Post-ictal with associated symptoms of headaches, resolved in under 20 seconds. Pt described it as a feeling like "going down on a roller coaster" prior to seizure. Patient denied any major stressors and traumatic events. She reports good family support consisting of parents, siblings, and fiance. She feels safe at home. Her PHQ 9 is 2. Patient was placed on 48 hours continuous LTM EEG that showed no new seizure like activity. Per Neurology, discussed with patient about her options about medications. Patient opted for rescue ASM and  Lamictal with continuous follow ups outpatient with neurology. Patient will be discharged on these medications and folic acid 1g daily.   In addition, patient presented with four days of nausea and vomiting likely secondary to hyperemesis gravidarum versus hyperemesis cannabinoid syndrome. She was started on Folic acid 1 g daily, Tylenol 650 mg oral every 8 hours as needed for headaches, Zofran IV 4 mg every 8 hours as needed for nausea and vomiting, with addition to Doxylamine 25 mg and Pyridoxine 25 mg, daily at bedtime for nausea. She reported nausea and vomiting worse between the hours of 3 AM to 6AM, when she wakes up in the morning, however resolved after receiving medications.  Reports her nausea and vomiting is better during the day.  She is able to keep solids and liquids down. Patient will be discharged on phenergan 12.5 every 8 hours as needed for nausea and vomiting. Pt mentioned that phenergan worked best for her in her previous pregnancy as well.  #Hypokalemia Likely secondary to vomiting and decrease PO intake. Supplemented.     #Asymptomatic bacteriuria UA showed moderate bilirubin, ketones 80, Protein 100, and rare bacteria. Patient denied any symptoms of dysuria or increase in frequency. Patient was treated  with one time dose of Fofomycin 3g.   #Single intrauterine pregnancy  US OB transvaginal on 8/23 showed single living IUP with estimated gestational age of [redacted] weeks 0 days. Patient has not seen OB for this pregnancy yet. Advised to follow up OP for routine OB checks.    DISCHARGE INSTRUCTIONS:   Discharge Instructions     Ambulatory referral to Neurology   Complete by: As directed    An appointment is requested in approximately: 4 weeks   Diet - low sodium heart healthy   Complete by: As directed    Discharge instructions   Complete by: As directed    Mercedes [name],  You came to the hospital for seizure and UTI, we treated you with antibiotics. We placed you on long term  EEG to capture any seizures, however no seizures were captured. You did have two more seizures in the hospital that was not captures in EEG.   For your seizures:  We started you on the following medications: - LamoTRIgine (Lamictal) 25 MG tablet  Week 1 and 2: Take 1 tablet (25mg  total) by mouth daily at 12 noon. Week 3 and 4: Take 2 tablets (50mg  total) by mouth daily at 12 noon. Week 5 and 6: Take 4 tablets (100mg  total) by mouth daily at 12 noon.   -Valtoco 20 MG Dose 10 MG/0.1ML Lqpk  Place 20 mg into the nose as needed (seizure lasting 2 minutes or more).   Please follow up with Dr. Teresa Coombs on Sept 26th at 4 PM  For your Nausea and Vomiting We started you on the following medication -Promethazine (Phenergan) 12.5 MG tablet, Take 1 tablet by mouth every 8 (eight) hours as needed for nausea or vomiting.   For your Pregnancy  We have started you on the following medications -Prenatal multivitamin, take 1 tablet by mouth daily at 12 noon. -Folic acid 1 MG tablet, take 1 tablet by mouth daily with food   Please remember your appointment with OBGYN on Sept 5th 2024, Virtual appointment for initial intake by nurse.    If you have any of these following symptoms, please call us or seek care at an emergency department: -Chest Pain -Difficulty Breathing -Worsening abdominal pain -Syncope (passing out) -Drooping of face -Slurred speech -Sudden weakness in your leg or arm -Fever -Chills -blood in the stool -dark black, sticky stool  I am glad you are feeling better. It was a pleasure taking care for you. I wish a good recovery and good health!   Jeral Pinch, DO, PGY1   Increase activity slowly   Complete by: As directed        SUBJECTIVE:  Patient is evaluated bedside, laying comfortably in bed, with EEG leads on her head.  Patient denies any acute complaints or concerns at this time.  Patient reports her nausea and vomiting is improving, worse usually in the morning but  relieved after taking medications.  Patient denies any current chest pain, shortness of breath, abdominal pain.  No other episodes of seizure-like activity prior last night.  No other concerns at this time.  Discharge Vitals:   BP 114/70 (BP Location: Left Arm)   Pulse 79   Temp 98.1 F (36.7 C) (Oral)   Resp 17   Ht 5\' 4"  (1.626 m)   Wt 111.1 kg   LMP  (LMP Unknown)   SpO2 100%   BMI 42.05 kg/m   OBJECTIVE:  Physical Exam   Constitutional: Appears well, laying comfortably in bed, no  acute distress. HENT: normocephalic atraumatic, with EEG leads on head.  Cardiovascular: regular rate and rhythm, no m/r/g.  No lower extremity edema bilaterally. Pulmonary/Chest: normal work of breathing on room air, lungs clear to auscultation bilaterally Abdominal: soft, non-distended, non tender. Bowel sounds present Neurological: alert & oriented x 3, no focal deficits.  Skin: warm and dry Psych: Normal mood and effect.  Pertinent Labs, Studies, and Procedures:     Latest Ref Rng & Units 06/20/2023    7:48 AM 06/17/2023    6:02 AM 06/16/2023    8:44 AM  CBC  WBC 4.0 - 10.5 K/uL 10.1  9.4  11.1   Hemoglobin 12.0 - 15.0 g/dL 16.1  09.6  04.5   Hematocrit 36.0 - 46.0 % 38.3  34.7  41.4   Platelets 150 - 400 K/uL 177  168  213        Latest Ref Rng & Units 06/21/2023    7:08 AM 06/20/2023    7:48 AM 06/19/2023    3:30 AM  CMP  Glucose 70 - 99 mg/dL 95  92  92   BUN 6 - 20 mg/dL 9  7  5    Creatinine 0.44 - 1.00 mg/dL 4.09  8.11  9.14   Sodium 135 - 145 mmol/L 133  131  133   Potassium 3.5 - 5.1 mmol/L 3.8  4.2  3.8   Chloride 98 - 111 mmol/L 97  101  102   CO2 22 - 32 mmol/L 22  21  23    Calcium 8.9 - 10.3 mg/dL 9.4  9.0  9.0     Overnight EEG with video  Result Date: 06/17/2023 Charlsie Quest, MD     06/18/2023  6:46 AM Patient Name: Mercedes [name] MRN: [id number] Epilepsy Attending: Charlsie Quest Referring Physician/Provider: Gust Rung, DO Duration: 06/16/2023 2309 to  06/17/2023 2309  Patient history: 33 y.o. pregnant female, current regular marijuana smoker, with a PMHx of anemia, asthma, cannabinoid hyperemesis syndrome, hypokalemia and nephrolithiasis, who presents to the ED with new onset seizures. EEG to evaluate for seizure.  Level of alertness: Awake, asleep  AEDs during EEG study: None  Technical aspects: This EEG study was done with scalp electrodes positioned according to the 10-20 International system of electrode placement. Electrical activity was reviewed with band pass filter of 1-70Hz , sensitivity of 7 uV/mm, display speed of 46mm/sec with a 60Hz  notched filter applied as appropriate. EEG data were recorded continuously and digitally stored.  Video monitoring was available and reviewed as appropriate.  Description: The posterior dominant rhythm consists of 9 Hz activity of moderate voltage (25-35 uV) seen predominantly in posterior head regions, symmetric and reactive to eye opening and eye closing.  Sleep was characterized by vertex waves, sleep spindles (12-14Hz ), maximal fronto-central region. IMPRESSION: This study is within normal limits. No seizures or epileptiform discharges were seen throughout the recording.  A normal interictal EEG does not exclude the diagnosis of epilepsy.   Charlsie Quest   EEG adult  Result Date: 06/17/2023 Charlsie Quest, MD     06/17/2023  5:53 AM Patient Name: Mercedes [name] MRN: [id number] Epilepsy Attending: Charlsie Quest Referring Physician/Provider: Caryl Pina, MD Date: 06/16/2023 Duration: 28.44 mins Patient history: 32 y.o. pregnant female, current regular marijuana smoker, with a PMHx of anemia, asthma, cannabinoid hyperemesis syndrome, hypokalemia and nephrolithiasis, who presents to the ED with new onset seizures. EEG to evaluate for seizure. Level of alertness: Awake AEDs during EEG study: None  Technical aspects: This EEG study was done with scalp electrodes positioned according to the 10-20 International  system of electrode placement. Electrical activity was reviewed with band pass filter of 1-70Hz , sensitivity of 7 uV/mm, display speed of 2mm/sec with a 60Hz  notched filter applied as appropriate. EEG data were recorded continuously and digitally stored.  Video monitoring was available and reviewed as appropriate. Description: The posterior dominant rhythm consists of 9 Hz activity of moderate voltage (25-35 uV) seen predominantly in posterior head regions, symmetric and reactive to eye opening and eye closing.  Physiologic photic driving was not seen during photic stimulation.  Hyperventilation was not performed.   IMPRESSION: This study is within normal limits. No seizures or epileptiform discharges were seen throughout the recording. A normal interictal EEG does not exclude the diagnosis of epilepsy. Charlsie Quest   MR BRAIN WO CONTRAST  Result Date: 06/16/2023 CLINICAL DATA:  New onset seizure EXAM: MRI HEAD WITHOUT CONTRAST TECHNIQUE: Multiplanar, multiecho pulse sequences of the brain and surrounding structures were obtained without intravenous contrast. COMPARISON:  None Available. FINDINGS: Brain: No acute infarct, mass effect or extra-axial collection. No acute or chronic hemorrhage. There is multifocal hyperintense T2-weighted signal within the white matter. Parenchymal volume and CSF spaces are normal. The midline structures are normal. The hippocampi are normal and symmetric in size and signal. The hypothalamus and mamillary bodies are normal. There is no cortical ectopia or dysplasia. Vascular: Major flow voids are preserved. Skull and upper cervical spine: Normal calvarium and skull base. Visualized upper cervical spine and soft tissues are normal. Sinuses/Orbits:No paranasal sinus fluid levels or advanced mucosal thickening. No mastoid or middle ear effusion. Normal orbits. IMPRESSION: 1. No acute intracranial abnormality. 2. Multifocal hyperintense T2-weighted signal within the white matter,  greater than expected for age. This is nonspecific but may be seen in the setting of migraine headaches or early chronic small vessel ischemia. Electronically Signed   By: Deatra Robinson M.D.   On: 06/16/2023 20:42   CT Head Wo Contrast  Result Date: 06/16/2023 CLINICAL DATA:  New onset seizure EXAM: CT HEAD WITHOUT CONTRAST TECHNIQUE: Contiguous axial images were obtained from the base of the skull through the vertex without intravenous contrast. RADIATION DOSE REDUCTION: This exam was performed according to the departmental dose-optimization program which includes automated exposure control, adjustment of the mA and/or kV according to patient size and/or use of iterative reconstruction technique. COMPARISON:  None Available. FINDINGS: Brain: No evidence of acute infarction, hemorrhage, hydrocephalus, extra-axial collection or mass lesion/mass effect. Vascular: No hyperdense vessel or unexpected calcification. Skull: Normal. Negative for fracture or focal lesion. Sinuses/Orbits: Retention cysts in the maxillary sinuses Other: None IMPRESSION: Negative non contrasted CT appearance of the brain. Electronically Signed   By: Jasmine Pang M.D.   On: 06/16/2023 18:00   US OB Comp < 14 Wks  Result Date: 06/16/2023 CLINICAL DATA:  Pelvic pain in 1st trimester pregnancy. EXAM: OBSTETRIC <14 WK [place] AND TRANSVAGINAL OB US TECHNIQUE: Both transabdominal and transvaginal ultrasound examinations were performed for complete evaluation of the gestation as well as the maternal uterus, adnexal regions, and pelvic cul-de-sac. Transvaginal technique was performed to assess early pregnancy. COMPARISON:  None Available. FINDINGS: Intrauterine gestational sac: Single Yolk sac:  Visualized. Embryo:  Visualized. Cardiac Activity: Visualized. Heart Rate: 191 bpm CRL:  10 mm   7 w   0 d                  [place] EDC: 02/02/2024 Subchorionic hemorrhage:  Small subchorionic hemorrhage noted. Maternal uterus/adnexae: 3.2 cm simple left ovarian  cyst noted. Normal appearance of right ovary. No suspicious adnexal mass or abnormal free fluid identified. IMPRESSION: Single living IUP with estimated gestational age of [redacted] weeks 0 days, and [place] EDC of 02/02/2024. Small subchorionic hemorrhage. Electronically Signed   By: Danae Orleans M.D.   On: 06/16/2023 16:41   US OB Transvaginal  Result Date: 06/16/2023 CLINICAL DATA:  Pelvic pain in 1st trimester pregnancy. EXAM: OBSTETRIC <14 WK [place] AND TRANSVAGINAL OB US TECHNIQUE: Both transabdominal and transvaginal ultrasound examinations were performed for complete evaluation of the gestation as well as the maternal uterus, adnexal regions, and pelvic cul-de-sac. Transvaginal technique was performed to assess early pregnancy. COMPARISON:  None Available. FINDINGS: Intrauterine gestational sac: Single Yolk sac:  Visualized. Embryo:  Visualized. Cardiac Activity: Visualized. Heart Rate: 191 bpm CRL:  10 mm   7 w   0 d                  [place] EDC: 02/02/2024 Subchorionic hemorrhage:  Small subchorionic hemorrhage noted. Maternal uterus/adnexae: 3.2 cm simple left ovarian cyst noted. Normal appearance of right ovary. No suspicious adnexal mass or abnormal free fluid identified. IMPRESSION: Single living IUP with estimated gestational age of [redacted] weeks 0 days, and [place] EDC of 02/02/2024. Small subchorionic hemorrhage. Electronically Signed   By: Danae Orleans M.D.   On: 06/16/2023 16:41     Signed: Jeral Pinch, D.O.  Internal Medicine Resident, PGY-1 [facility]  Pager: [phone] 12:39 PM, 06/21/2023

## 2023-06-21 NOTE — Discharge Instructions (Addendum)
Ms. Tanji,  You came to the hospital for seizure and UTI, we treated you with antibiotics. We placed you on long term EEG to capture any seizures, however no seizures were captured. You did have two more seizures in the hospital that was not captures in EEG.   For your seizures:  We started you on the following medications: - LamoTRIgine (Lamictal) 25 MG tablet  Week 1 and 2: Take 1 tablet (25mg  total) by mouth daily at 12 noon. Week 3 and 4: Take 2 tablets (50mg  total) by mouth daily at 12 noon. Week 5 and 6: Take 4 tablets (100mg  total) by mouth daily at 12 noon.   -Valtoco 20 MG Dose 10 MG/0.1ML Lqpk  Place 20 mg into the nose as needed (seizure lasting 2 minutes or more).   Please follow up with Dr. Teresa Coombs on Sept 26th at 4 PM  For your Nausea and Vomiting We started you on the following medication -Promethazine (Phenergan) 12.5 MG tablet, Take 1 tablet by mouth every 8 (eight) hours as needed for nausea or vomiting.   For your Pregnancy  We have started you on the following medications -Prenatal multivitamin, take 1 tablet by mouth daily at 12 noon. -Folic acid 1 MG tablet, take 1 tablet by mouth daily with food   Please remember your appointment with OBGYN on Sept 5th 2024, Virtual appointment for initial intake by nurse.   Discussed seizure precautions including no driving for 6 months    If you have any of these following symptoms, please call us or seek care at an emergency department: -Chest Pain -Difficulty Breathing -Worsening abdominal pain -Syncope (passing out) -Drooping of face -Slurred speech -Sudden weakness in your leg or arm -Fever -Chills -blood in the stool -dark black, sticky stool  I am glad you are feeling better. It was a pleasure taking care for you. I wish a good recovery and good health!   Jeral Pinch, DO, PGY1

## 2023-06-21 NOTE — Progress Notes (Signed)
vLTM discontinued  [facility] notifie  Noskin breakdown at all skin sites

## 2023-06-21 NOTE — Procedures (Signed)
Patient Name: Mercedes [name]  MRN: [id number]  Epilepsy Attending: Charlsie Quest  Referring Physician/Provider: Gust Rung, DO  Duration: 06/18/2023 2309 to 06/19/2023 4696   Patient history: 32 y.o. pregnant female, current regular marijuana smoker, with a PMHx of anemia, asthma, cannabinoid hyperemesis syndrome, hypokalemia and nephrolithiasis, who presents to the ED with new onset seizures. EEG to evaluate for seizure.   Level of alertness: Awake, asleep   AEDs during EEG study: None   Technical aspects: This EEG study was done with scalp electrodes positioned according to the 10-20 International system of electrode placement. Electrical activity was reviewed with band pass filter of 1-70Hz , sensitivity of 7 uV/mm, display speed of 16mm/sec with a 60Hz  notched filter applied as appropriate. EEG data were recorded continuously and digitally stored.  Video monitoring was available and reviewed as appropriate.   Description: The posterior dominant rhythm consists of 9 Hz activity of moderate voltage (25-35 uV) seen predominantly in posterior head regions, symmetric and reactive to eye opening and eye closing.  Sleep was characterized by vertex waves, sleep spindles (12-14Hz ), maximal fronto-central region. Photic driving was seen during photic stimulation. No EEG change was seen during hyperventilation.   IMPRESSION: This study is within normal limits. No seizures or epileptiform discharges were seen throughout the recording.   A normal interictal EEG does not exclude the diagnosis of epilepsy.     Morna Flud [name]

## 2023-06-21 NOTE — Progress Notes (Signed)
Subjective: No further events overnight.  Denies any other concerns.  ROS: negative except above Examination  Vital signs in last 24 hours: Temp:  [98.1 F (36.7 C)-98.9 F (37.2 C)] 98.1 F (36.7 C) (08/28 0752) Pulse Rate:  [76-92] 79 (08/28 0752) Resp:  [17-19] 17 (08/28 0401) BP: (102-134)/(63-119) 114/70 (08/28 0752) SpO2:  [98 %-100 %] 100 % (08/28 0752)  General: lying in bed, NAD Neuro: AOx3, no aphasia, no dysarthria, cranial nerves II to XII are grossly intact, spondylous moving all 4 extremities in bed  Basic Metabolic Panel: Recent Labs  Lab 06/16/23 0844 06/17/23 0602 06/18/23 0354 06/19/23 0330 06/20/23 0748 06/21/23 0708  NA 136 133* 131* 133* 131* 133*  K 3.1* 2.9* 3.0* 3.8 4.2 3.8  CL 96* 98 101 102 101 97*  CO2 25 24 23 23  21* 22  GLUCOSE 99 97 83 92 92 95  BUN 15 9 <5* 5* 7 9  CREATININE 0.78 0.64 0.63 0.71 0.79 0.63  CALCIUM 9.3 8.6* 8.4* 9.0 9.0 9.4  MG 2.4  --  1.8  --   --   --   PHOS  --  3.4 3.1  --   --   --     CBC: Recent Labs  Lab 06/16/23 0844 06/17/23 0602 06/20/23 0748  WBC 11.1* 9.4 10.1  NEUTROABS 8.7*  --   --   HGB 13.7 12.0 13.2  HCT 41.4 34.7* 38.3  MCV 87.7 87.2 87.2  PLT 213 168 177     Coagulation Studies: No results for input(s): "LABPROT", "INR" in the last 72 hours.  Imaging No new imaging overnight  ASSESSMENT AND PLAN: 32 year old pregnant female, regular marijuana smoker presented with new onset seizure-like activity.   Seizure-like activity -No seizures overnight.    Recommendations -Patient has had long-term EEG for over 4 days without any ictal-interictal abnormality.  However she describes an aura of feeling like she is on a roller coaster.  Fianc at bedside had described bilateral upper extremity stiffening with flexion of right wrist followed by whole body shaking as well as bladder incontinence lasting for less than 1 minute.  Patient had 2 seizure-like episodes on 06/16/2023 and 1 episode on  06/19/2023 (not recorded on EEG).  Also reports her daughter has had 2 seizure-like episodes.  Therefore even though suspicion is low, I discussed with patient about starting lamotrigine versus only prescribing rescue medication -Patient states she would feel safer if she is on antiseizure medications.  I did discuss that all antiseizure medications are at least category C during pregnancy and explained the potential side effects to the fetus including neural tube defects specifically when she is still in her first trimester.  Patient understands the risks and benefits and would like to proceed with starting lamotrigine -Also discussed the potential side effects of lamotrigine including rash, dizziness, headaches -Will start patient on lamotrigine on starter pack -Will also prescribe intranasal Valtoco 20 mg for seizure lasting more than 2 minutes as rescue -Recommend folic acid 1 mg daily as well as prenatal admits -Discussed seizure precautions including no driving for 6 months -Will schedule patient to follow-up with Dr. Teresa Coombs at [facility] on September 26 at 4 PM.  May also benefit from ambulatory EEG monitoring if episodes persist or return to emergency room -I have extensively counseled the patient regarding potential side effects of lamotrigine, the need for follow-up with neurology as well as regular monitoring of lamotrigine levels during pregnancy, taking folic acid, refraining from cannabis  use and updating her OB about this -This plan was discussed in detail with patient at bedside as well as medicine team  Seizure precautions: Per [facility] statutes, patients with seizures are not allowed to drive until they have been seizure-free for six months and cleared by a physician    Use caution when using heavy equipment or power tools. Avoid working on ladders or at heights. Take showers instead of baths. Ensure the water temperature is not too high on the home water  heater. Do not go swimming alone. Do not lock yourself in a room alone (i.e. bathroom). When caring for infants or small children, sit down when holding, feeding, or changing them to minimize risk of injury to the child in the event you have a seizure. Maintain good sleep hygiene. Avoid alcohol.    If patient has another seizure, call 911 and bring them back to the ED if: A.  The seizure lasts longer than 5 minutes.      B.  The patient doesn't wake shortly after the seizure or has new problems such as difficulty seeing, speaking or moving following the seizure C.  The patient was injured during the seizure D.  The patient has a temperature over 102 F (39C) E.  The patient vomited during the seizure and now is having trouble breathing    During the Seizure   - First, ensure adequate ventilation and place patients on the floor on their left side  Loosen clothing around the neck and ensure the airway is patent. If the patient is clenching the teeth, do not force the mouth open with any object as this can cause severe damage - Remove all items from the surrounding that can be hazardous. The patient may be oblivious to what's happening and may not even know what he or she is doing. If the patient is confused and wandering, either gently guide him/her away and block access to outside areas - Reassure the individual and be comforting - Call 911. In most cases, the seizure ends before EMS arrives. However, there are cases when seizures may last over 3 to 5 minutes. Or the individual may have developed breathing difficulties or severe injuries. If a pregnant patient or a person with diabetes develops a seizure, it is prudent to call an ambulance.    After the Seizure (Postictal Stage)   After a seizure, most patients experience confusion, fatigue, muscle pain and/or a headache. Thus, one should permit the individual to sleep. For the next few days, reassurance is essential. Being calm and helping  reorient the person is also of importance.   Most seizures are painless and end spontaneously. Seizures are not harmful to others but can lead to complications such as stress on the lungs, brain and the heart. Individuals with prior lung problems may develop labored breathing and respiratory distress.      I have spent a total of  40 minutes with the patient reviewing hospital notes,  test results, labs and examining the patient as well as establishing an assessment and plan that was discussed personally with the patient.  > 50% of time was spent in direct patient care.    Lindie Spruce Epilepsy [facility] For questions after 5pm please refer to AMION to reach the Neurologist on call

## 2023-06-21 NOTE — TOC Transition Note (Signed)
Transition of Care [facility]) - CM/SW Discharge Note   Patient Details  Name: Mercedes [name] MRN: [id number] Date of Birth: 1991/06/12  [facility] CM/SW Contact:  Kermit Balo, RN Phone Number: 06/21/2023, 12:52 PM   Clinical Narrative:     Pt is discharging home with self care.  She has new PCP appt on AVS.  Medications for home to be delivered to the room per [facility] pharmacy. Pt has transportation home.  Final next level of care: Home/Self Care Barriers to Discharge: No Barriers Identified   Patient Goals and CMS Choice      Discharge Placement                         Discharge Plan and Services Additional resources added to the After Visit Summary for     Discharge Planning Services: CM Consult                                 Social Determinants of Health (SDOH) Interventions SDOH Screenings   Food Insecurity: No Food Insecurity (06/16/2023)  Housing: Low Risk  (06/16/2023)  Transportation Needs: No Transportation Needs (06/16/2023)  Utilities: Not At Risk (06/16/2023)  Tobacco Use: Medium Risk (06/16/2023)     Readmission Risk Interventions     No data to display

## 2023-06-21 NOTE — Progress Notes (Signed)
Pt gathered all belongings. Discharge paper worked discussed at [employer]. Pt voiced understanding. Pt wheeled off unit by this RN

## 2023-06-21 NOTE — TOC Benefit Eligibility Note (Signed)
Patient Product/process development scientist completed.    The patient is insured through [employer].     Ran test claim for Valtoco 15 mgand the current 30 day co-pay is $4.00.   This test claim was processed through [facility]- copay amounts may vary at other pharmacies due to pharmacy/plan contracts, or as the patient moves through the different stages of their insurance plan.     Roland Earl, CPHT Pharmacy Technician III Certified Patient Advocate [facility] Patient Advocate Team Direct Number: [phone]  Fax: [phone]

## 2023-06-24 ENCOUNTER — Inpatient Hospital Stay (HOSPITAL_COMMUNITY)
Admission: EM | Admit: 2023-06-24 | Discharge: 2023-06-28 | DRG: 833 | Disposition: A | Discharge status: Home / Self Care | Payer: Medicaid Other | Attending: Internal Medicine | Admitting: Internal Medicine

## 2023-06-24 ENCOUNTER — Emergency Department (HOSPITAL_COMMUNITY): Payer: Medicaid Other

## 2023-06-24 ENCOUNTER — Other Ambulatory Visit: Payer: Self-pay

## 2023-06-24 ENCOUNTER — Encounter (HOSPITAL_COMMUNITY): Payer: Self-pay

## 2023-06-24 DIAGNOSIS — O210 Mild hyperemesis gravidarum: Secondary | ICD-10-CM | POA: Diagnosis present

## 2023-06-24 DIAGNOSIS — R569 Unspecified convulsions: Secondary | ICD-10-CM | POA: Diagnosis not present

## 2023-06-24 DIAGNOSIS — R7989 Other specified abnormal findings of blood chemistry: Secondary | ICD-10-CM | POA: Diagnosis present

## 2023-06-24 DIAGNOSIS — Z3A01 Less than 8 weeks gestation of pregnancy: Secondary | ICD-10-CM

## 2023-06-24 DIAGNOSIS — O99281 Endocrine, nutritional and metabolic diseases complicating pregnancy, first trimester: Secondary | ICD-10-CM | POA: Diagnosis present

## 2023-06-24 DIAGNOSIS — F411 Generalized anxiety disorder: Secondary | ICD-10-CM | POA: Diagnosis present

## 2023-06-24 DIAGNOSIS — E860 Dehydration: Secondary | ICD-10-CM | POA: Diagnosis present

## 2023-06-24 DIAGNOSIS — Z880 Allergy status to penicillin: Secondary | ICD-10-CM

## 2023-06-24 DIAGNOSIS — R000 Tachycardia, unspecified: Secondary | ICD-10-CM | POA: Diagnosis not present

## 2023-06-24 DIAGNOSIS — O99321 Drug use complicating pregnancy, first trimester: Secondary | ICD-10-CM | POA: Diagnosis present

## 2023-06-24 DIAGNOSIS — F1290 Cannabis use, unspecified, uncomplicated: Secondary | ICD-10-CM | POA: Diagnosis present

## 2023-06-24 DIAGNOSIS — Z87442 Personal history of urinary calculi: Secondary | ICD-10-CM

## 2023-06-24 DIAGNOSIS — Z560 Unemployment, unspecified: Secondary | ICD-10-CM

## 2023-06-24 DIAGNOSIS — O99351 Diseases of the nervous system complicating pregnancy, first trimester: Principal | ICD-10-CM | POA: Diagnosis present

## 2023-06-24 DIAGNOSIS — O211 Hyperemesis gravidarum with metabolic disturbance: Secondary | ICD-10-CM | POA: Diagnosis present

## 2023-06-24 DIAGNOSIS — Z79899 Other long term (current) drug therapy: Secondary | ICD-10-CM

## 2023-06-24 DIAGNOSIS — O99341 Other mental disorders complicating pregnancy, first trimester: Secondary | ICD-10-CM | POA: Diagnosis present

## 2023-06-24 DIAGNOSIS — R0789 Other chest pain: Secondary | ICD-10-CM | POA: Diagnosis present

## 2023-06-24 DIAGNOSIS — R32 Unspecified urinary incontinence: Secondary | ICD-10-CM | POA: Diagnosis present

## 2023-06-24 DIAGNOSIS — Z825 Family history of asthma and other chronic lower respiratory diseases: Secondary | ICD-10-CM

## 2023-06-24 DIAGNOSIS — Z87891 Personal history of nicotine dependence: Secondary | ICD-10-CM

## 2023-06-24 DIAGNOSIS — Z3A08 8 weeks gestation of pregnancy: Secondary | ICD-10-CM

## 2023-06-24 DIAGNOSIS — F445 Conversion disorder with seizures or convulsions: Secondary | ICD-10-CM | POA: Diagnosis present

## 2023-06-24 DIAGNOSIS — F4310 Post-traumatic stress disorder, unspecified: Secondary | ICD-10-CM | POA: Diagnosis present

## 2023-06-24 DIAGNOSIS — E871 Hypo-osmolality and hyponatremia: Secondary | ICD-10-CM | POA: Insufficient documentation

## 2023-06-24 LAB — BASIC METABOLIC PANEL
Anion gap: 13 (ref 5–15)
BUN: 10 mg/dL (ref 6–20)
CO2: 23 mmol/L (ref 22–32)
Calcium: 9.5 mg/dL (ref 8.9–10.3)
Chloride: 97 mmol/L — ABNORMAL LOW (ref 98–111)
Creatinine, Ser: 0.79 mg/dL (ref 0.44–1.00)
GFR, Estimated: >60 mL/min (ref >60)
Glucose, Bld: 89 mg/dL (ref 70–99)
Potassium: 3.7 mmol/L (ref 3.5–5.1)
Sodium: 133 mmol/L — ABNORMAL LOW (ref 135–145)

## 2023-06-24 LAB — TROPONIN I (HIGH SENSITIVITY)
Troponin I (High Sensitivity): 3 ng/L (ref <18)
Troponin I (High Sensitivity): <2 ng/L (ref <18)

## 2023-06-24 LAB — URINALYSIS, ROUTINE W REFLEX MICROSCOPIC
Bacteria, UA: NONE SEEN
Glucose, UA: NEGATIVE mg/dL
Hgb urine dipstick: NEGATIVE
Ketones, ur: 5 mg/dL — AB
Nitrite: NEGATIVE
Protein, ur: 30 mg/dL — AB
Specific Gravity, Urine: 1.026 (ref 1.005–1.030)
pH: 5 (ref 5.0–8.0)

## 2023-06-24 LAB — CBC
HCT: 40.7 % (ref 36.0–46.0)
Hemoglobin: 13.7 g/dL (ref 12.0–15.0)
MCH: 28.8 pg (ref 26.0–34.0)
MCHC: 33.7 g/dL (ref 30.0–36.0)
MCV: 85.7 fL (ref 80.0–100.0)
Platelets: 211 10*3/uL (ref 150–400)
RBC: 4.75 MIL/uL (ref 3.87–5.11)
RDW: 12.4 % (ref 11.5–15.5)
WBC: 10 10*3/uL (ref 4.0–10.5)
nRBC: 0 % (ref 0.0–0.2)

## 2023-06-24 LAB — HCG, QUANTITATIVE, PREGNANCY: hCG, Beta Chain, Quant, S: 119368 m[IU]/mL — ABNORMAL HIGH (ref <5)

## 2023-06-24 MED ORDER — LIDOCAINE 5 % EX PTCH
1.0000 | MEDICATED_PATCH | CUTANEOUS | Status: DC
  Administered 2023-06-24 – 2023-06-27 (×4): 1 via TRANSDERMAL
  Filled 2023-06-24 (×4): qty 1

## 2023-06-24 MED ORDER — ENOXAPARIN SODIUM 40 MG/0.4ML IJ SOSY
40.0000 mg | PREFILLED_SYRINGE | INTRAMUSCULAR | Status: DC
  Administered 2023-06-25 – 2023-06-26 (×2): 40 mg via SUBCUTANEOUS
  Filled 2023-06-24 (×2): qty 0.4

## 2023-06-24 MED ORDER — LORAZEPAM 2 MG/ML IJ SOLN: 1.0000 mg | Freq: Once | INTRAMUSCULAR | Status: AC

## 2023-06-24 MED ORDER — ACETAMINOPHEN 650 MG RE SUPP: 650.0000 mg | Freq: Four times a day (QID) | RECTAL | Status: DC | PRN

## 2023-06-24 MED ORDER — LEVETIRACETAM IN NACL 1000 MG/100ML IV SOLN
1000.0000 mg | Freq: Once | INTRAVENOUS | Status: AC
  Administered 2023-06-24: 1000 mg via INTRAVENOUS
  Filled 2023-06-24: qty 100

## 2023-06-24 MED ORDER — LORAZEPAM 2 MG/ML PO CONC
1.0000 mg | ORAL | Status: DC
Start: 2023-06-24 — End: 2023-06-24

## 2023-06-24 MED ORDER — ACETAMINOPHEN 325 MG PO TABS
650.0000 mg | ORAL_TABLET | Freq: Once | ORAL | Status: AC
  Administered 2023-06-24: 650 mg via ORAL
  Filled 2023-06-24: qty 2

## 2023-06-24 MED ORDER — LORAZEPAM 2 MG/ML IJ SOLN
INTRAMUSCULAR | Status: AC
  Administered 2023-06-24: 1 mg via INTRAVENOUS
  Filled 2023-06-24: qty 1

## 2023-06-24 MED ORDER — ACETAMINOPHEN 325 MG PO TABS
650.0000 mg | ORAL_TABLET | Freq: Four times a day (QID) | ORAL | Status: DC | PRN
  Administered 2023-06-28: 650 mg via ORAL
  Filled 2023-06-24: qty 2

## 2023-06-24 NOTE — ED Notes (Signed)
X-ray at bedside

## 2023-06-24 NOTE — ED Triage Notes (Signed)
Patient bib [facility] for chest pressure that started around 3pm.  The patient had a seizure immediatley prior to the chest pressure starting and the husband gave her 20mg  of intranasal diazepam. This was her 2nd seizure of the day she had another seizure at 530 this monirng. She is [redacted] weeks pregnant. Received 324 aspirin enroute.

## 2023-06-24 NOTE — ED Notes (Addendum)
ED TO INPATIENT HANDOFF REPORT  ED Nurse Name and Phone #: Abygale Karpf/ [phone]  S Name/Age/Gender Mercedes [name] 32 y.o. female Room/Bed: 039C/039C  Code Status   Code Status: Full Code  Home/SNF/Other Home Patient oriented to: self, place, time, and situation Is this baseline? Yes   Triage Complete: Triage complete  Chief Complaint Seizures (HCC) [R56.9]  Triage Note Patient bib [facility] for chest pressure that started around 3pm.  The patient had a seizure immediatley prior to the chest pressure starting and the husband gave her 20mg  of intranasal diazepam. This was her 2nd seizure of the day she had another seizure at 530 this monirng. She is [redacted] weeks pregnant. Received 324 aspirin enroute.    Allergies Allergies  Allergen Reactions   Penicillins Other (See Comments)    Reaction:  Unknown  Has patient had a PCN reaction causing immediate rash, facial/tongue/throat swelling, SOB or lightheadedness with hypotension: Unknown Has patient had a PCN reaction causing severe rash involving mucus membranes or skin necrosis: Unknown Has patient had a PCN reaction that required hospitalization: Unknown Has patient had a PCN reaction occurring within the last 10 years: No If all of the above answers are "NO", then may proceed with Cephalosporin use. Unknown reaction Childhood allergy    Level of Care/Admitting Diagnosis ED Disposition     ED Disposition  Admit   Condition  --   Comment  Hospital Area: [facility] [[id number]]  Level of Care: Med-Surg [16]  May place patient in observation at [facility] or [facility] if equivalent level of care is available:: No  Covid Evaluation: Asymptomatic - no recent exposure (last 10 days) testing not required  Diagnosis: Seizures [facility]) [[id number]]  Admitting Physician: Earl Lagos [id number]  Attending Physician: Earl Lagos [phone]          B Medical/Surgery History Past Medical History:  Diagnosis  Date   Anemia    Asthma    Hypokalemia    Kidney stone 2018   Past Surgical History:  Procedure Laterality Date   NO PAST SURGERIES     WISDOM TOOTH EXTRACTION       A IV Location/Drains/Wounds Patient Lines/Drains/Airways Status     Active Line/Drains/Airways     Name Placement date Placement time Site Days   Peripheral IV 06/24/23 20 G Left;Posterior Hand 06/24/23  1557  Hand  less than 1            Intake/Output Last 24 hours No intake or output data in the 24 hours ending 06/24/23 2355  Labs/Imaging Results for orders placed or performed during the hospital encounter of 06/24/23 (from the past 48 hour(s))  CBC     Status: None   Collection Time: 06/24/23  4:40 PM  Result Value Ref Range   WBC 10.0 4.0 - 10.5 K/uL   RBC 4.75 3.87 - 5.11 MIL/uL   Hemoglobin 13.7 12.0 - 15.0 g/dL   HCT 43.3 29.5 - 18.8 %   MCV 85.7 80.0 - 100.0 fL   MCH 28.8 26.0 - 34.0 pg   MCHC 33.7 30.0 - 36.0 g/dL   RDW 41.6 60.6 - 30.1 %   Platelets 211 150 - 400 K/uL   nRBC 0.0 0.0 - 0.2 %    Comment: Performed at [facility], [address], [place], [place] [address]  Basic metabolic panel     Status: Abnormal   Collection Time: 06/24/23  4:40 PM  Result Value Ref Range   Sodium  133 (L) 135 - 145 mmol/L   Potassium 3.7 3.5 - 5.1 mmol/L   Chloride 97 (L) 98 - 111 mmol/L   CO2 23 22 - 32 mmol/L   Glucose, Bld 89 70 - 99 mg/dL    Comment: Glucose reference range applies only to samples taken after fasting for at least 8 hours.   BUN 10 6 - 20 mg/dL   Creatinine, Ser 8.11 0.44 - 1.00 mg/dL   Calcium 9.5 8.9 - 91.4 mg/dL   GFR, Estimated >78 >29 mL/min    Comment: (NOTE) Calculated using the CKD-EPI Creatinine Equation (2021)    Anion gap 13 5 - 15    Comment: Performed at [facility], [address] [place], [place] [address]  Troponin I (High Sensitivity)     Status: None   Collection Time: 06/24/23  4:40 PM  Result Value Ref Range   Troponin I (High Sensitivity)  3 <18 ng/L    Comment: (NOTE) Elevated high sensitivity troponin I (hsTnI) values and significant  changes across serial measurements may suggest ACS but many other  chronic and acute conditions are known to elevate hsTnI results.  Refer to the "Links" section for chest pain algorithms and additional  guidance. Performed at [facility], [address], [place], [place] [address]   hCG, quantitative, pregnancy     Status: Abnormal   Collection Time: 06/24/23  4:40 PM  Result Value Ref Range   hCG, Beta Chain, Quant, S 119,368 (H) <5 mIU/mL    Comment:          GEST. AGE      CONC.  (mIU/mL)   <=1 WEEK        5 - 50     2 WEEKS       50 - 500     3 WEEKS       100 - 10,000     4 WEEKS     1,000 - 30,000     5 WEEKS     3,500 - 115,000   6-8 WEEKS     12,000 - 270,000    12 WEEKS     15,000 - 220,000        FEMALE AND NON-PREGNANT FEMALE:     LESS THAN 5 mIU/mL Performed at [facility], [address], [place], [place] [address]   Urinalysis, Routine w reflex microscopic -Urine, Clean Catch     Status: Abnormal   Collection Time: 06/24/23  5:45 PM  Result Value Ref Range   Color, Urine AMBER (A) YELLOW    Comment: BIOCHEMICALS MAY BE AFFECTED BY COLOR   APPearance HAZY (A) CLEAR   Specific Gravity, Urine 1.026 1.005 - 1.030   pH 5.0 5.0 - 8.0   Glucose, UA NEGATIVE NEGATIVE mg/dL   Hgb urine dipstick NEGATIVE NEGATIVE   Bilirubin Urine SMALL (A) NEGATIVE   Ketones, ur 5 (A) NEGATIVE mg/dL   Protein, ur 30 (A) NEGATIVE mg/dL   Nitrite NEGATIVE NEGATIVE   Leukocytes,Ua SMALL (A) NEGATIVE   RBC / HPF 0-5 0 - 5 RBC/hpf   WBC, UA 0-5 0 - 5 WBC/hpf   Bacteria, UA NONE SEEN NONE SEEN   Squamous Epithelial / HPF 6-10 0 - 5 /HPF   Mucus PRESENT     Comment: Performed at [facility], [address] [place], [place] [address]  Troponin I (High Sensitivity)     Status: None   Collection Time:  06/24/23  7:53 PM  Result Value Ref Range   Troponin I (High  Sensitivity) <2 <18 ng/L    Comment: (NOTE) Elevated high sensitivity troponin I (hsTnI) values and significant  changes across serial measurements may suggest ACS but many other  chronic and acute conditions are known to elevate hsTnI results.  Refer to the "Links" section for chest pain algorithms and additional  guidance. Performed at [facility], [address] [place], [place] [address]    DG Chest Portable 1 View  Result Date: 06/24/2023 CLINICAL DATA:  Chest pain. EXAM: PORTABLE CHEST 1 VIEW COMPARISON:  12/09/2021 FINDINGS: The lungs are clear without focal pneumonia, edema, pneumothorax or pleural effusion. The cardiopericardial silhouette is within normal limits for size. No acute bony abnormality. Telemetry leads overlie the chest. IMPRESSION: No active disease. Electronically Signed   By: Kennith Center M.D.   On: 06/24/2023 17:48    Pending Labs Unresulted Labs (From admission, onward)     Start     Ordered   06/25/23 0500  Basic metabolic panel  Tomorrow morning,   R        06/24/23 2306   06/25/23 0500  CBC  Tomorrow morning,   R        06/24/23 2306   06/25/23 0500  TSH  Tomorrow morning,   R        06/24/23 2306            Vitals/Pain Today's Vitals   06/24/23 1645 06/24/23 1745 06/24/23 2115 06/24/23 2326  BP: 116/82 100/77 103/69 102/74  Pulse: 98 100 (!) 102 93  Resp: 13 15 17 14   Temp:    98.6 F (37 C)  TempSrc:    Oral  SpO2: 100% 100% 100% 99%  Weight:      Height:      PainSc:        Isolation Precautions No active isolations  Medications Medications  lidocaine (LIDODERM) 5 % 1 patch (1 patch Transdermal Patch Applied 06/24/23 2017)  enoxaparin (LOVENOX) injection 40 mg (has no administration in time range)  acetaminophen (TYLENOL) tablet 650 mg (has no administration in time range)    Or  acetaminophen (TYLENOL) suppository 650 mg (has no administration in time range)  levETIRAcetam (KEPPRA) IVPB 1000 mg/100 mL premix (0 mg  Intravenous Stopped 06/24/23 2115)  acetaminophen (TYLENOL) tablet 650 mg (650 mg Oral Given 06/24/23 2016)  LORazepam (ATIVAN) injection 1 mg (1 mg Intravenous Given 06/24/23 2112)    Mobility walks     Focused Assessments     R Recommendations: See Admitting Provider Note  Report given to:   Additional Notes: Pt came in for CP and seizures. Pt has a 20 G in L hand. Pt got ativan and keppra. Pt will get an overnight EEG observation. Family is at bedside.

## 2023-06-24 NOTE — Hospital Course (Addendum)
32 year old female roughly [redacted] weeks pregnant presenting to the emergency department for seizure-like activity.    Reportedly had an episode last night 2 episodes prior to arrival.    Recently discharged from the hospital for the same.    She states after she woke up after the second episode she was having some central chest pain.    No shortness of breath.  No headache no vision changes.  No fevers no chills.  No abdominal pain vaginal bleeding  Neurology planning EEG.  Had chest pain that started dull and became sharp. Pain feels deep. Had shortness of breath but it has resolved.   Was taking all seizure meds after leaving.   No previous problem with seizure.   Has pain in the back of her calves that started today.

## 2023-06-24 NOTE — Consult Note (Signed)
Neurology Consultation Reason for Consult: Episodes of concern for seizures Referring Physician: Maple Hudson, T  CC: Concern for seizures  History is obtained from: Mercedes [name], husband  HPI: Mercedes Mercedes [name] is a 32 y.o. female with a history of new onset episodes concerning for seizures that started on 8/23.  Since that time she has had multiple episodes, and was connected to continuous EEG to try and characterize these episodes.  Unfortunately, was being connected to a continuous EEG, she has not had any further episodes.  She was started on Lamictal and discharged on Mercedes 28th.  Since discharge, she has had multiple episodes, and received rescue medication three times.  Apparently they have been giving her Mercedes rescue medication after episodes lasting only 15 seconds.  I informed them that really this should only be used for seizures lasting more than a couple of minutes.  She also states that Mercedes medication she was started on, lamotrigine, has been making her exceedingly tired, and unable to do anything all day if she takes it.  Due to multiple episodes, she was brought back to Mercedes emergency department a day where I discussed with Mercedes ER physician.  Given that she had been started on Lamictal which takes a long time to become therapeutic, advised loading with Keppra 1 g x 1 and following it with 500 mg twice daily.  Unfortunately after Mercedes load with Keppra she had another episode.  Mercedes episode description consists of a feeling in her stomach like if she is on a roller coaster that is following, then a feeling of lightheadedness and then she is unaware for a period of time.  It is only happened when she is seated or laying down, never while upright.  She then has bilateral shaking which Mercedes husband demonstrates as bilateral arm extension Mercedes last for a few seconds at a time.  Following this, she is not significantly postictal.  Her eyes were closed during Mercedes episode.  She is not responsive during Mercedes  episode.  She has had a single episode of incontinence with one of Mercedes episodes.  Past Medical History:  Diagnosis Date   Anemia    Asthma    Hypokalemia    Kidney stone 2018     Family History  Problem Relation Age of Onset   Cancer Mother        ovarian   Asthma Mother      Social History:  reports that she quit smoking about 3 years ago. Her smoking use included cigarettes. She has never used smokeless tobacco. She reports that she does not currently use drugs after having used Mercedes following drugs: Marijuana. She reports that she does not drink alcohol.   Exam: Current vital signs: BP 103/69   Pulse (!) 102   Temp 98.6 F (37 C) (Oral)   Resp 17   Ht 5\' 4"  (1.626 m)   Wt 111 kg   LMP  (LMP Unknown)   SpO2 100%   BMI 42.00 kg/m  Vital signs in last 24 hours: Temp:  [98.6 F (37 C)] 98.6 F (37 C) (08/31 1627) Pulse Rate:  [95-102] 102 (08/31 2115) Resp:  [13-22] 17 (08/31 2115) BP: (100-131)/(69-82) 103/69 (08/31 2115) SpO2:  [100 %] 100 % (08/31 2115) Weight:  [474 kg] 111 kg (08/31 1632)   Physical Exam  Appears well-developed and well-nourished.   Neuro: Mental Status: Mercedes [name] is awake, alert, oriented to person, place, month, year, and situation. Mercedes [name] is able to give a clear and  coherent history. No signs of aphasia or neglect Cranial Nerves: II: Visual Fields are full. Pupils are equal, round, and reactive to light.   III,IV, VI: EOMI without ptosis or diploplia.  V: Facial sensation is symmetric to temperature VII: Facial movement is symmetric.  VIII: hearing is intact to voice X: Uvula elevates symmetrically XI: Shoulder shrug is symmetric. XII: tongue is midline without atrophy or fasciculations.  Motor: Tone is normal. Bulk is normal. 5/5 strength was present in all four extremities.  Sensory: Sensation is symmetric to light touch and temperature in Mercedes arms and legs. Cerebellar: FNF and HKS are intact bilaterally    I have  reviewed labs in epic and Mercedes results pertinent to this consultation are: Mild hyponatremia at 133  I have reviewed Mercedes images obtained: MRI brain-no clear seizure focus  Impression: 32 year old female with recurrent episodes of concern.  Some of Mercedes description is concerning for nonorganic etiology, while other aspects of it do have some concern for possible epileptic episodes.  My suspicion that these do represent epileptic episodes is fairly low, but given that a former diagnosis would be prudent, I think repeating continuous EEG given Mercedes frequency of her episodes would be reasonable.  Recommendations: 1) reconnect LTM EEG 2) hold all antiepileptics for now 3) neurology will follow   Ritta Slot, MD [facility] [phone]  If 7pm- 7am, please page neurology on call as listed in AMION.

## 2023-06-24 NOTE — ED Notes (Signed)
Patient called out and said she had the feeling in her stomach that she was going to have a seizure, when RN walked in she was having seizure like activity for about 1-1.5 minutes. EDP came to bedside and she had another episode of seizure like activity. 1mg  of ativan was given.

## 2023-06-24 NOTE — ED Provider Notes (Signed)
[place] [facility]ERGENCY DEPARTMENT AT [facility] Provider Note   CSN: [id number] Arrival date & time: 06/24/23  1625     History  Chief Complaint  Patient presents with   Chest Pain   Seizures    Mercedes [name] is a 32 y.o. female.  32 year old female roughly [redacted] weeks pregnant presenting to the emergency department for seizure-like activity.  Reportedly had an episode last night 2 episodes prior to arrival.  Recently discharged from the hospital for the same.  She states after she woke up after the second episode she was having some central chest pain.  No shortness of breath.  No headache no vision changes.  No fevers no chills.  No abdominal pain vaginal bleeding   Chest Pain Seizures      Home Medications Prior to Admission medications   Medication Sig Start Date End Date Taking? Authorizing Provider  diazePAM, 20 MG Dose, (VALTOCO 20 MG DOSE) 2 x 10 MG/0.1ML LQPK Place 20 mg into the nose as needed (seizure lasting 2 minutes or more). 06/21/23   Charlsie Quest, MD  folic acid (FOLVITE) 1 MG tablet Take 1 tablet (1 mg total) by mouth daily. 06/21/23 07/21/23  Inez Catalina, MD  lamoTRIgine (LAMICTAL) 25 MG tablet Week 1 and 2: Take 1 tablet (25mg  total) by mouth daily at 12 noon. Week 3 and 4: Take 2 tablets (50mg  total) by mouth daily at 12 noon. Week 5 and 6: Take 4 tablets (100mg  total) by mouth daily at 12 noon. 06/21/23   Charlsie Quest, MD  Prenatal Vit-Fe Fumarate-FA (PRENATAL MULTIVITAMIN) TABS tablet Take 1 tablet by mouth daily at 12 noon. 06/21/23 07/21/23  Tawkaliyar, Laney Potash, DO  promethazine (PHENERGAN) 12.5 MG tablet Take 1 tablet (12.5 mg total) by mouth every 8 (eight) hours as needed for nausea or vomiting. 06/21/23 07/21/23  Inez Catalina, MD      Allergies    Penicillins    Review of Systems   Review of Systems  Cardiovascular:  Positive for chest pain.  Neurological:  Positive for seizures.    Physical Exam Updated Vital Signs BP  103/69   Pulse (!) 102   Temp 98.6 F (37 C) (Oral)   Resp 17   Ht 5\' 4"  (1.626 m)   Wt 111 kg   LMP  (LMP Unknown)   SpO2 100%   BMI 42.00 kg/m  Physical Exam Vitals reviewed.  Constitutional:      Appearance: She is obese.  Cardiovascular:     Rate and Rhythm: Normal rate and regular rhythm.     Heart sounds: Normal heart sounds.  Pulmonary:     Breath sounds: Normal breath sounds.  Chest:     Chest wall: Tenderness present.  Musculoskeletal:     Cervical back: Normal range of motion.     Right lower leg: No edema.     Left lower leg: No edema.  Skin:    General: Skin is warm and dry.     Capillary Refill: Capillary refill takes less than 2 seconds.  Neurological:     General: No focal deficit present.     Mental Status: She is alert and oriented to person, place, and time.     ED Results / Procedures / Treatments   Labs (all labs ordered are listed, but only abnormal results are displayed) Labs Reviewed  BASIC METABOLIC PANEL - Abnormal; Notable for the following components:      Result Value  Sodium 133 (*)    Chloride 97 (*)    All other components within normal limits  HCG, QUANTITATIVE, PREGNANCY - Abnormal; Notable for the following components:   hCG, Beta Chain, Mahalia Longest 409,811 (*)    All other components within normal limits  URINALYSIS, ROUTINE W REFLEX MICROSCOPIC - Abnormal; Notable for the following components:   Color, Urine AMBER (*)    APPearance HAZY (*)    Bilirubin Urine SMALL (*)    Ketones, ur 5 (*)    Protein, ur 30 (*)    Leukocytes,Ua SMALL (*)    All other components within normal limits  CBC  TROPONIN I (HIGH SENSITIVITY)  TROPONIN I (HIGH SENSITIVITY)    EKG None  Radiology DG Chest Portable 1 View  Result Date: 06/24/2023 CLINICAL DATA:  Chest pain. EXAM: PORTABLE CHEST 1 VIEW COMPARISON:  12/09/2021 FINDINGS: The lungs are clear without focal pneumonia, edema, pneumothorax or pleural effusion. The cardiopericardial  silhouette is within normal limits for size. No acute bony abnormality. Telemetry leads overlie the chest. IMPRESSION: No active disease. Electronically Signed   By: Kennith Center M.D.   On: 06/24/2023 17:48    Procedures Procedures    Medications Ordered in ED Medications  lidocaine (LIDODERM) 5 % 1 patch (1 patch Transdermal Patch Applied 06/24/23 2017)  levETIRAcetam (KEPPRA) IVPB 1000 mg/100 mL premix (0 mg Intravenous Stopped 06/24/23 2115)  acetaminophen (TYLENOL) tablet 650 mg (650 mg Oral Given 06/24/23 2016)  LORazepam (ATIVAN) injection 1 mg (1 mg Intravenous Given 06/24/23 2112)    ED Course/ Medical Decision Making/ A&P Clinical Course as of 06/24/23 2132  Sat Jun 24, 2023  1636 US pelvis 06/16/23: IMPRESSION: Single living IUP with estimated gestational age of [redacted] weeks 0 days, and [place] EDC of 02/02/2024.  [TY]  1637 Admitted 8/23 for seizures. Started on lamictal.  [TY]  2005 Spoke with Dr. Luisa Hart with neurology regarding patient.  Recommending stopping lamotrigine.  Would switch to Keppra give 1 g IV now and write for 500 twice daily. [TY]  2114 Called to bedside patient having possible seizure events episode abated shortly after my arrival.  Nurse reports 1 minute to 1-1/2 minutes.  Patient was shaking on her side moving her right upper extremity rhythmically.  During event she did open her eye when I asked her to, but continued shaking.  Observed her and roughly 5 minutes later had another episode.  Again lasting approximately a minute.  She did not follow any commands during that episode.  1 mg of Ativan given at the end of that event.. [TY]  2123 Spoke with neurology, recommending admission to medicine will come see and evaluate patient and plan for EEG.   [TY]    Clinical Course User Index [TY] Coral Spikes, DO                                 Medical Decision Making 32 year old female roughly [redacted] weeks pregnant presenting emergency department for  seizure-like activity  and chest pain.  She is afebrile nontachycardic hemodynamically stable here in the emergency department.  Maintain ox saturation on room air.  Chest pain appears musculoskeletal as it is reproducible on palpation.  She has had negative troponin x 2.  EKG without ST segment changes indicate ischemia.  Her basic metabolic panel with borderline hyponatremia, no kidney injury.  CBC with no leukocytosis to suggest systemic infection.  Case discussed with  neurology who had recommended switching patient's Lamictal to Keppra.  After patient's mother left the room she began having seizure-like activity.  2 episodes roughly 5 minutes apart with no return to baseline.  Each lasting roughly a minute to a minute and a half.  We discussed with neurology who is recommending admission they will evaluate patient and likely placed on EEG.  Amount and/or Complexity of Data Reviewed Independent Historian: spouse    Details: Gave rescue medication at home External Data Reviewed:     Details: Appeared to have negative workup recently Labs: ordered. Radiology: ordered.  Risk OTC drugs. Prescription drug management. Decision regarding hospitalization.          Final Clinical Impression(s) / ED Diagnoses Final diagnoses:  Seizure-like activity [facility])    Rx / DC Orders ED Discharge Orders     None         Coral Spikes, DO 06/24/23 2132

## 2023-06-24 NOTE — H&P (Incomplete)
Date: 06/24/2023               Patient Name:  Mercedes [name] MRN: [id number]  DOB: [birth year] Age / Sex: 32 y.o., female   PCP: Patient, No Pcp Per         Medical Service: Internal Medicine Teaching Service         Attending Physician: Dr. Earl Lagos, MD      First Contact: Dr. Manuela Neptune, MD Pager [phone]    Second Contact: Dr. Marrianne Mood, MD Pager [phone]         After Hours (After 5p/  First Contact Pager: [phone]  weekends / holidays): Second Contact Pager: [phone]   SUBJECTIVE   Chief Complaint: seizures  History of Present Illness:   Mercedes [name] is a 32 year old female who is approximately [redacted] weeks pregnant with a past medical history of hyperemesis gravidarum and seizures who presents to the emergency department after having two witnessed seizures today.  Her fianc is bedside.  He witnessed the seizures today and said they were similar to the seizures she had prior to her previous admission. She did not lose consciousness or bite her tongue during these episodes, but she does report one episode of incontinence.  She did not have a significant postictal state.  She was admitted on 8/23 for similar seizures and was discharged on 8/28.  During that admission, she had a pelvic ultrasound which showed a single living intrauterine pregnancy with a gestational age of [redacted] weeks.  She was discharged on Lamictal 25 mg taper and Valtoco 25 mg rescue, with the plan for outpatient follow up in September.   She states that she is planning to have an abortion because she feels that this is unsafe for the baby and is concerned that with the seizures and the medication side effects, she may be doing harm to her child.  She has not discussed this with her obstetrician.  We encouraged her to make an appointment with her obstetrician following discharge in order to discuss this with them in greater depth, as it is a consequential decision.   She also reports new  chest pain following her second seizure, which she described as 10/10 dull mid chest pressure that radiated down toward her left flank.  She says the pain is a 9/10 now, but evolved into a sharper pain.  The pain does not change with movement.  She denies shortness of breath, headache, syncope.  She also denies fever, chills, abdominal pain, vaginal bleeding. She is having nausea and vomiting but says this is normal for her when she's pregnant and this has not acutely worsened.  She is also complaining of bilateral calf soreness, which began today.  ED Course:  Presented to the ED concerned about 2 seizures earlier today. Had two episodes of seizure-like activity in the ED. 1 mg Ativan given after second episode. Neurology was consulted. Lamotrigine was stopped and Keppra 1g IV was given. EKG was unremarkable. Troponins negative x 2. BMP had borderline hyponatremia, otherwise unremarkable. CBC unremarkable.   Meds:  Current Meds  Medication Sig  . diazePAM, 20 MG Dose, (VALTOCO 20 MG DOSE) 2 x 10 MG/0.1ML LQPK Place 20 mg into the nose as needed (seizure lasting 2 minutes or more).  . folic acid (FOLVITE) 1 MG tablet Take 1 tablet (1 mg total) by mouth daily.  Marland Kitchen lamoTRIgine (LAMICTAL) 25 MG tablet Week 1 and 2: Take 1 tablet (25mg  total) by mouth daily at 12 noon. Week  3 and 4: Take 2 tablets (50mg  total) by mouth daily at 12 noon. Week 5 and 6: Take 4 tablets (100mg  total) by mouth daily at 12 noon.  . Prenatal Vit-Fe Fumarate-FA (PRENATAL MULTIVITAMIN) TABS tablet Take 1 tablet by mouth daily at 12 noon.  . promethazine (PHENERGAN) 12.5 MG tablet Take 1 tablet (12.5 mg total) by mouth every 8 (eight) hours as needed for nausea or vomiting.    Past Medical History  Past Surgical History:  Procedure Laterality Date  . NO PAST SURGERIES    . WISDOM TOOTH EXTRACTION      Social:  Lives With: fiance and three daughters  Occupation: unemployed Level of Function: independent with all ADLs and  iADLs, not currently driving due to seizures PCP: none Substances:  Family History:  Daughter has seizures, unclear etiology Mother has cardiac disease and seizures Several family members with DM  Allergies: Allergies as of 06/24/2023 - Review Complete 06/24/2023  Allergen Reaction Noted  . Penicillins Other (See Comments) 01/25/2016    Review of Systems: A complete ROS was negative except as per HPI.   OBJECTIVE:   Physical Exam: Blood pressure 103/69, pulse (!) 102, temperature 98.6 F (37 C), temperature source Oral, resp. rate 17, height 5\' 4"  (1.626 m), weight 111 kg, SpO2 100%.  Constitutional: well-appearing, no acute distress, sitting up in bed HENT: normocephalic atraumatic, mucous membranes moist Eyes: conjunctiva non-erythematous Neck: supple Cardiovascular: regular rate and rhythm, no m/r/g; no edema. 2+ distal pulses in UE/LE bilaterally Pulmonary/Chest: normal work of breathing on room air, lungs clear to auscultation bilaterally Abdominal: soft, non-tender, non-distended MSK: normal bulk and tone; achiness in bilateral calves, not worse with palpation; no erythema, swelling; chest pain not reproducible on my exam Neurological: alert & oriented x 3, 5/5 strength in bilateral upper and lower extremities Skin: warm and dry Psych: Normal mood and affect  Labs: CBC    Component Value Date/Time   WBC 10.0 06/24/2023 1640   RBC 4.75 06/24/2023 1640   HGB 13.7 06/24/2023 1640   HGB 9.5 (L) 06/08/2017 1536   HCT 40.7 06/24/2023 1640   HCT 27.4 (L) 06/08/2017 1536   PLT 211 06/24/2023 1640   PLT 188 06/08/2017 1536   MCV 85.7 06/24/2023 1640   MCV 85 06/08/2017 1536   MCH 28.8 06/24/2023 1640   MCHC 33.7 06/24/2023 1640   RDW 12.4 06/24/2023 1640   RDW 13.7 06/08/2017 1536   LYMPHSABS 1.5 06/16/2023 0844   MONOABS 0.9 06/16/2023 0844   EOSABS 0.0 06/16/2023 0844   BASOSABS 0.0 06/16/2023 0844     CMP     Component Value Date/Time   NA 133 (L)  06/24/2023 1640   NA 137 06/08/2017 1536   K 3.7 06/24/2023 1640   CL 97 (L) 06/24/2023 1640   CO2 23 06/24/2023 1640   GLUCOSE 89 06/24/2023 1640   BUN 10 06/24/2023 1640   BUN 9 06/08/2017 1536   CREATININE 0.79 06/24/2023 1640   CALCIUM 9.5 06/24/2023 1640   PROT 6.2 (L) 06/18/2023 0354   ALBUMIN 3.0 (L) 06/18/2023 0354   AST 11 (L) 06/18/2023 0354   ALT 9 06/18/2023 0354   ALKPHOS 67 06/18/2023 0354   BILITOT 1.2 06/18/2023 0354   GFRNONAA >60 06/24/2023 1640   GFRAA >60 05/30/2019 0458    Imaging:  Chest x-ray: no acute cardiopulmonary processes  EKG: personally reviewed my interpretation is sinus rhythm, borderline tachycardia. No ST changes.   ASSESSMENT & PLAN:   Assessment &  Plan by Problem: Principal Problem:   Seizures (HCC)  Lucy Shanel Contreras is a 32 y.o. [phone] pregnant person (~[redacted] weeks gestation) living with a history of seizures and hyperemesis gravidarum who presented with seizure-like activity and admitted for management on hospital day 0  Seizure-like activity Presented to the ED following 2 witnessed seizures by her fianc and was subsequently found to have 2 more episodes in the emergency department.  Per her fianc, these were similar to her previous episodes for which she was admitted last week.  Neurology was consulted in the emergency department and switched her from Lamictal to Keppra. She was loaded on 1g Keppra this evening. Further antiepileptics held for now per neurology. Continuous EEG to be obtained overnight.  CBC, BMP unrevealing.  MRI from last admission shows no clear etiology of seizures.  Unclear whether seizure-like activity is organic or nonorganic in nature at this time. -Follow-up continuous EEG -Hold anti-epileptics for now per neurology -Neurology following  Chest pain Patient describes acute onset chest pain this morning began as a dull pressure and radiated down to her left side, evolving into a sharp pain.  It has improved  slightly since this morning, but she still rates it a 9/10.  ECG did not show any signs concerning for ischemia.  Troponins were negative x 2.  Low concern for ACS at this time. She was given a lidocaine patch in the emergency department. -Lidocaine patch -Tylenol as needed for pain  Intrauterine pregnancy, approximately [redacted] weeks gestation Patient has 3 daughters and this is her fourth pregnancy.  She has a history of hyperemesis gravidarum and reports some nausea and vomiting during this pregnancy as well.  She expressed concern about the health of her baby given her seizure activity and the side effects of the medications she is taking, stating that "she is planning to have an abortion next week".  She has not spoken with her obstetrician about this.  We recommended that she do so in order to make sure she is making a fully informed decision.  Diet: Normal VTE: Enoxaparin IVF: None Code: Full  Prior to Admission Living Arrangement: Home, living with fiance and 3 daughter Anticipated Discharge Location: Home Barriers to Discharge: Medical management  Dispo: Admit patient to Observation with expected length of stay less than 2 midnights.  Signed: Annett Fabian, MD Internal Medicine Resident, PGY-1 [facility]  Pager: [phone]  06/24/2023, 11:50 PM

## 2023-06-24 NOTE — H&P (Incomplete)
Date: 06/25/2023               Patient Name:  Mercedes [name] MRN: [id number]  DOB: 11-25-1990 Age / Sex: 32 y.o., female   PCP: Patient, No Pcp Per         Medical Service: Internal Medicine Teaching Service         Attending Physician: Dr. Earl Lagos, MD      First Contact: Dr. Manuela Neptune, MD Pager [phone]    Second Contact: Dr. Marrianne Mood, MD Pager [phone]         After Hours (After 5p/  First Contact Pager: [phone]  weekends / holidays): Second Contact Pager: [phone]   SUBJECTIVE   Chief Complaint: seizures  History of Present Illness:   Mercedes [name] is a 32 year old female who is approximately [redacted] weeks pregnant with a past medical history of hyperemesis gravidarum and seizures who presents to the emergency department after having two witnessed seizures today.  Her fianc is bedside.  He witnessed the seizures today and said they were similar to the seizures she had that led to her previous admission. She did not lose consciousness or bite her tongue during these episodes, but she does report one episode of incontinence.  She did not have a significant postictal state.  She was admitted on 8/23 for similar seizures and was discharged on 8/28. She was discharged on Lamictal 25 mg taper and Valtoco 25 mg rescue, with the plan for outpatient follow up in September.   During that admission, she had a pelvic ultrasound which showed a single living intrauterine pregnancy with a gestational age of [redacted] weeks. She states that she is planning to have an abortion because she feels that this is unsafe for the baby and is concerned that with the seizures and the medication side effects, she may be doing harm to her child.  She has not discussed this with her obstetrician.  We encouraged her to make an appointment with her obstetrician following discharge in order to discuss this with them in greater depth, as it is a consequential decision.   She also reports new  chest pain following her second seizure, which she described as 10/10 dull mid chest pressure that radiated down toward her left flank.  She says the pain is a 9/10 now, but evolved into a sharper pain.  The pain does not change with movement.  She denies shortness of breath, headache, syncope.  She also denies fever, chills, abdominal pain, vaginal bleeding. She is having nausea and vomiting but says this is normal for her when she's pregnant and this has not acutely worsened.  She is also complaining of bilateral calf soreness, which began today.  ED Course:  Presented to the ED concerned about 2 seizures earlier today. Had two episodes of seizure-like activity in the ED. 1 mg Ativan given after second episode. Neurology was consulted. Lamotrigine was stopped and Keppra 1g IV was given. EKG was unremarkable. Troponins negative x 2. BMP had borderline hyponatremia, otherwise unremarkable. CBC unremarkable.   Meds:  Current Meds  Medication Sig   diazePAM, 20 MG Dose, (VALTOCO 20 MG DOSE) 2 x 10 MG/0.1ML LQPK Place 20 mg into the nose as needed (seizure lasting 2 minutes or more).   folic acid (FOLVITE) 1 MG tablet Take 1 tablet (1 mg total) by mouth daily.   lamoTRIgine (LAMICTAL) 25 MG tablet Week 1 and 2: Take 1 tablet (25mg  total) by mouth daily at 12 noon. Week 3  and 4: Take 2 tablets (50mg  total) by mouth daily at 12 noon. Week 5 and 6: Take 4 tablets (100mg  total) by mouth daily at 12 noon.   Prenatal Vit-Fe Fumarate-FA (PRENATAL MULTIVITAMIN) TABS tablet Take 1 tablet by mouth daily at 12 noon.   promethazine (PHENERGAN) 12.5 MG tablet Take 1 tablet (12.5 mg total) by mouth every 8 (eight) hours as needed for nausea or vomiting.    Past Medical History  Past Surgical History:  Procedure Laterality Date   NO PAST SURGERIES     WISDOM TOOTH EXTRACTION      Social:  Lives With: fiance and three daughters  Occupation: unemployed Level of Function: independent with all ADLs and iADLs,  not currently driving due to seizures PCP: none Substances: marijuana use, no alcohol or other substances  Family History:  Daughter has seizures, unclear etiology Mother has cardiac disease and seizures Several family members with DM  Allergies: Allergies as of 06/24/2023 - Review Complete 06/24/2023  Allergen Reaction Noted   Penicillins Other (See Comments) 01/25/2016    Review of Systems: A complete ROS was negative except as per HPI.   OBJECTIVE:   Physical Exam: Blood pressure 103/69, pulse (!) 102, temperature 98.6 F (37 C), temperature source Oral, resp. rate 17, height 5\' 4"  (1.626 m), weight 111 kg, SpO2 100%.  Constitutional: well-appearing, no acute distress, sitting up in bed HENT: normocephalic atraumatic, mucous membranes moist Eyes: conjunctiva non-erythematous Neck: supple Cardiovascular: regular rate and rhythm, no m/r/g; no edema. 2+ distal pulses in UE/LE bilaterally Pulmonary/Chest: normal work of breathing on room air, lungs clear to auscultation bilaterally Abdominal: soft, non-tender, non-distended MSK: normal bulk and tone; achiness in bilateral calves, not worse with palpation; no erythema, swelling; chest pain not reproducible on my exam Neurological: alert & oriented x 3, 5/5 strength in bilateral upper and lower extremities Skin: warm and dry Psych: Normal mood and affect  Labs: CBC    Component Value Date/Time   WBC 10.0 06/24/2023 1640   RBC 4.75 06/24/2023 1640   HGB 13.7 06/24/2023 1640   HGB 9.5 (L) 06/08/2017 1536   HCT 40.7 06/24/2023 1640   HCT 27.4 (L) 06/08/2017 1536   PLT 211 06/24/2023 1640   PLT 188 06/08/2017 1536   MCV 85.7 06/24/2023 1640   MCV 85 06/08/2017 1536   MCH 28.8 06/24/2023 1640   MCHC 33.7 06/24/2023 1640   RDW 12.4 06/24/2023 1640   RDW 13.7 06/08/2017 1536   LYMPHSABS 1.5 06/16/2023 0844   MONOABS 0.9 06/16/2023 0844   EOSABS 0.0 06/16/2023 0844   BASOSABS 0.0 06/16/2023 0844     CMP     Component  Value Date/Time   NA 133 (L) 06/24/2023 1640   NA 137 06/08/2017 1536   K 3.7 06/24/2023 1640   CL 97 (L) 06/24/2023 1640   CO2 23 06/24/2023 1640   GLUCOSE 89 06/24/2023 1640   BUN 10 06/24/2023 1640   BUN 9 06/08/2017 1536   CREATININE 0.79 06/24/2023 1640   CALCIUM 9.5 06/24/2023 1640   PROT 6.2 (L) 06/18/2023 0354   ALBUMIN 3.0 (L) 06/18/2023 0354   AST 11 (L) 06/18/2023 0354   ALT 9 06/18/2023 0354   ALKPHOS 67 06/18/2023 0354   BILITOT 1.2 06/18/2023 0354   GFRNONAA >60 06/24/2023 1640   GFRAA >60 05/30/2019 0458    Imaging:  Chest x-ray: no acute cardiopulmonary processes  EKG: personally reviewed my interpretation is sinus rhythm, borderline tachycardia. No ST changes.  ASSESSMENT & PLAN:   Assessment & Plan by Problem: Principal Problem:   Seizures (HCC)  Mercedes [name] is a 32 y.o. [phone] pregnant person (~[redacted] weeks gestation) living with a history of seizures and hyperemesis gravidarum who presented with seizure-like activity and admitted for management on hospital day 0  Seizure-like activity Presented to the ED following 2 witnessed seizures by her fianc and was subsequently found to have 2 more episodes in the emergency department.  Per her fianc, these were similar to her previous episodes for which she was admitted last week.  Neurology was consulted in the emergency department and switched her from Lamictal to Keppra. She was loaded on 1g Keppra this evening. Further antiepileptics held for now per neurology. Continuous EEG to be obtained overnight.  CBC, BMP unrevealing.  MRI from last admission shows no clear etiology of seizures.  Unclear whether seizure-like activity is organic or nonorganic in nature at this time. -Follow-up continuous EEG -Hold anti-epileptics for now per neurology -Neurology following -1L LR bolus  Chest pain Patient describes acute onset chest pain this morning began as a dull pressure and radiated down to her left side,  evolving into a sharp pain.  It has improved slightly since this morning, but she still rates it a 9/10.  ECG did not show any signs concerning for ischemia.  Troponins were negative x 2.  Low concern for ACS at this time. She was given a lidocaine patch in the emergency department. -Lidocaine patch -Tylenol as needed for pain  Intrauterine pregnancy, approximately [redacted] weeks gestation Patient has 3 daughters and this is her fourth pregnancy.  She has a history of hyperemesis gravidarum and reports some nausea and vomiting during this pregnancy.  She expressed concern about the health of her baby given her seizure activity and the side effects of the medications she is taking, stating that "she is planning to have an abortion next week" because she does not want to do the baby any harm.  She has not spoken with her obstetrician about this.  We recommended that she do so in order to make sure she is making a fully informed decision.  Diet: Normal VTE: Enoxaparin IVF: 1L LR bolus, 75cc/hr Code: Full  Prior to Admission Living Arrangement: Home, living with fiance and 3 daughter Anticipated Discharge Location: Home Barriers to Discharge: Medical management  Dispo: Admit patient to Observation with expected length of stay less than 2 midnights.  Signed: Annett Fabian, MD Internal Medicine Resident, PGY-1 [facility]  Pager: [phone]  06/25/2023, 12:10 AM

## 2023-06-25 ENCOUNTER — Observation Stay (HOSPITAL_COMMUNITY): Payer: Medicaid Other

## 2023-06-25 DIAGNOSIS — O99341 Other mental disorders complicating pregnancy, first trimester: Secondary | ICD-10-CM | POA: Diagnosis present

## 2023-06-25 DIAGNOSIS — R7989 Other specified abnormal findings of blood chemistry: Secondary | ICD-10-CM | POA: Diagnosis present

## 2023-06-25 DIAGNOSIS — F445 Conversion disorder with seizures or convulsions: Secondary | ICD-10-CM | POA: Diagnosis present

## 2023-06-25 DIAGNOSIS — O211 Hyperemesis gravidarum with metabolic disturbance: Secondary | ICD-10-CM | POA: Diagnosis present

## 2023-06-25 DIAGNOSIS — Z880 Allergy status to penicillin: Secondary | ICD-10-CM | POA: Diagnosis not present

## 2023-06-25 DIAGNOSIS — Z825 Family history of asthma and other chronic lower respiratory diseases: Secondary | ICD-10-CM | POA: Diagnosis not present

## 2023-06-25 DIAGNOSIS — F1290 Cannabis use, unspecified, uncomplicated: Secondary | ICD-10-CM | POA: Diagnosis present

## 2023-06-25 DIAGNOSIS — Z79899 Other long term (current) drug therapy: Secondary | ICD-10-CM | POA: Diagnosis not present

## 2023-06-25 DIAGNOSIS — Z560 Unemployment, unspecified: Secondary | ICD-10-CM | POA: Diagnosis not present

## 2023-06-25 DIAGNOSIS — O99351 Diseases of the nervous system complicating pregnancy, first trimester: Secondary | ICD-10-CM | POA: Diagnosis present

## 2023-06-25 DIAGNOSIS — F4310 Post-traumatic stress disorder, unspecified: Secondary | ICD-10-CM | POA: Diagnosis present

## 2023-06-25 DIAGNOSIS — O99281 Endocrine, nutritional and metabolic diseases complicating pregnancy, first trimester: Secondary | ICD-10-CM | POA: Diagnosis present

## 2023-06-25 DIAGNOSIS — Z3A08 8 weeks gestation of pregnancy: Secondary | ICD-10-CM

## 2023-06-25 DIAGNOSIS — R32 Unspecified urinary incontinence: Secondary | ICD-10-CM | POA: Diagnosis present

## 2023-06-25 DIAGNOSIS — Z87891 Personal history of nicotine dependence: Secondary | ICD-10-CM | POA: Diagnosis not present

## 2023-06-25 DIAGNOSIS — R000 Tachycardia, unspecified: Secondary | ICD-10-CM | POA: Diagnosis not present

## 2023-06-25 DIAGNOSIS — R0789 Other chest pain: Secondary | ICD-10-CM | POA: Diagnosis present

## 2023-06-25 DIAGNOSIS — E871 Hypo-osmolality and hyponatremia: Secondary | ICD-10-CM | POA: Insufficient documentation

## 2023-06-25 DIAGNOSIS — Z3A01 Less than 8 weeks gestation of pregnancy: Secondary | ICD-10-CM | POA: Diagnosis not present

## 2023-06-25 DIAGNOSIS — R569 Unspecified convulsions: Secondary | ICD-10-CM | POA: Diagnosis present

## 2023-06-25 DIAGNOSIS — F411 Generalized anxiety disorder: Secondary | ICD-10-CM | POA: Diagnosis present

## 2023-06-25 DIAGNOSIS — E860 Dehydration: Secondary | ICD-10-CM | POA: Diagnosis present

## 2023-06-25 DIAGNOSIS — O99321 Drug use complicating pregnancy, first trimester: Secondary | ICD-10-CM | POA: Diagnosis present

## 2023-06-25 DIAGNOSIS — Z87442 Personal history of urinary calculi: Secondary | ICD-10-CM | POA: Diagnosis not present

## 2023-06-25 LAB — BASIC METABOLIC PANEL
Anion gap: 8 (ref 5–15)
BUN: 13 mg/dL (ref 6–20)
CO2: 21 mmol/L — ABNORMAL LOW (ref 22–32)
Calcium: 8.7 mg/dL — ABNORMAL LOW (ref 8.9–10.3)
Chloride: 101 mmol/L (ref 98–111)
Creatinine, Ser: 1.06 mg/dL — ABNORMAL HIGH (ref 0.44–1.00)
GFR, Estimated: >60 mL/min (ref >60)
Glucose, Bld: 95 mg/dL (ref 70–99)
Potassium: 3.5 mmol/L (ref 3.5–5.1)
Sodium: 130 mmol/L — ABNORMAL LOW (ref 135–145)

## 2023-06-25 LAB — CBC
HCT: 36.4 % (ref 36.0–46.0)
Hemoglobin: 12.2 g/dL (ref 12.0–15.0)
MCH: 29.3 pg (ref 26.0–34.0)
MCHC: 33.5 g/dL (ref 30.0–36.0)
MCV: 87.5 fL (ref 80.0–100.0)
Platelets: 180 10*3/uL (ref 150–400)
RBC: 4.16 MIL/uL (ref 3.87–5.11)
RDW: 12.4 % (ref 11.5–15.5)
WBC: 8.5 10*3/uL (ref 4.0–10.5)
nRBC: 0 % (ref 0.0–0.2)

## 2023-06-25 LAB — TSH: TSH: 0.24 u[IU]/mL — ABNORMAL LOW (ref 0.350–4.500)

## 2023-06-25 MED ORDER — LACTATED RINGERS IV BOLUS
1000.0000 mL | Freq: Once | INTRAVENOUS | Status: AC
  Administered 2023-06-25: 1000 mL via INTRAVENOUS

## 2023-06-25 MED ORDER — PRENATAL MULTIVITAMIN CH
1.0000 | ORAL_TABLET | Freq: Every day | ORAL | Status: DC
  Administered 2023-06-25 – 2023-06-28 (×3): 1 via ORAL
  Filled 2023-06-25 (×4): qty 1

## 2023-06-25 MED ORDER — PROMETHAZINE HCL 25 MG PO TABS
25.0000 mg | ORAL_TABLET | Freq: Four times a day (QID) | ORAL | Status: DC | PRN
  Administered 2023-06-25 – 2023-06-27 (×3): 25 mg via ORAL
  Filled 2023-06-25 (×4): qty 1

## 2023-06-25 MED ORDER — ONDANSETRON HCL 4 MG/2ML IJ SOLN
4.0000 mg | Freq: Once | INTRAMUSCULAR | Status: AC
  Administered 2023-06-25: 4 mg via INTRAVENOUS
  Filled 2023-06-25: qty 2

## 2023-06-25 NOTE — Plan of Care (Signed)
  Problem: Education: Goal: Knowledge of General Education information will improve Description Including pain rating scale, medication(s)/side effects and non-pharmacologic comfort measures Outcome: Progressing   

## 2023-06-25 NOTE — Progress Notes (Signed)
                 Interval history Has some chest pain over the sternum, same pain that she came to the hospital with.  It is getting better since admission.  It is worse when she moves around but also bothers her while she is in bed.  Hurts some when she takes deep breath.  Its tender to palpation.  No seizures since admission.  Overall doing okay but still pretty frightened and stressed out.  Physical exam Blood pressure 99/66, pulse 98, temperature 98 F (36.7 C), temperature source Oral, resp. rate 18, height 5\' 4"  (1.626 m), weight 111 kg, SpO2 98%.  Well-appearing, no distress Heart rate is normal, rhythm is regular, radial pulses strong Breathing is regular and unlabored on room air, anterior lung fields clear Skin is warm and dry Alert and oriented, speech is normal sounding, tongue is midline, symmetric palatal elevation, equal grip strength, no pronator drift, gross lower extremity strength is equal, object naming is normal, pupils equal bilaterally, extraocular movements intact Pleasant, congruent affect  Labs, images, and other studies Sodium 130 Creatinine 0.79 => 1.06 CBC normal UA with hazy, amber urine  Assessment and plan Hospital day 0  Anis Monne Szewczyk is a 32 y.o. with hyperemesis gravidarum and seizures who returns to the hospital for seizure-like activity and is admitted for same, also with pregnancy of [redacted] weeks gestation and nausea.  Seizure-like activity Her symptoms are somewhat atypical for epileptic seizures.  She has episodes with very quick return to baseline function, not much of a postictal state.  To date, EEG and MRI if not shown any foci of epileptogenicity.  This could be nonepileptic seizure-like activity, but epilepsy is not ruled out.  Holding antiepileptics while on EEG.  Appreciate neurology assistance with this case. - Hold home lamotrigine - LTM EEG  [redacted] weeks gestation of pregnancy Hyperemesis gravidarum Cautious prescribing in setting of  early pregnancy.  Supportive therapy for nausea. - Promethazine 25 mg p.o. every 6 hours as needed for nausea and vomiting  Hyponatremia Elevated serum creatinine Suspect she is a little volume down.  Will treat with IV fluid bolus. - LR 1 L bolus via IV  Noncardiac chest pain Atypical for cardiac pain.  Exam is reassuring.  EKG on admission with sinus tachycardia, no signs of ischemia.  Continue supportive therapy. - Lidocaine patch and Tylenol  Diet: Regular IVF: LR bolus VTE: enoxaparin (LOVENOX) injection 40 mg Start: 06/25/23 1000  Code: Full PT/OT recommendations: N/A TOC recommendations: N/A Family Update: At bedside  Discharge plan: Pending workup of seizure-like activity  Marrianne Mood MD 06/25/2023, 11:27 AM  Pager: [phone] After 5pm or weekend: [phone]

## 2023-06-25 NOTE — Procedures (Signed)
Patient Name: Shawan Sallee  MRN: [id number]  Epilepsy Attending: Charlsie Quest  Referring Physician/Provider: Gust Rung, DO  Duration: 06/25/2023 0128 to 06/26/2023 0128   Patient history: 32 y.o. pregnant female, current regular marijuana smoker, with a PMHx of anemia, asthma, cannabinoid hyperemesis syndrome, hypokalemia and nephrolithiasis, who presents to the ED with new onset seizures. EEG to evaluate for seizure.   Level of alertness: Awake, asleep   AEDs during EEG study: None   Technical aspects: This EEG study was done with scalp electrodes positioned according to the 10-20 International system of electrode placement. Electrical activity was reviewed with band pass filter of 1-70Hz , sensitivity of 7 uV/mm, display speed of 81mm/sec with a 60Hz  notched filter applied as appropriate. EEG data were recorded continuously and digitally stored.  Video monitoring was available and reviewed as appropriate.   Description: The posterior dominant rhythm consists of 9 Hz activity of moderate voltage (25-35 uV) seen predominantly in posterior head regions, symmetric and reactive to eye opening and eye closing.  Sleep was characterized by vertex waves, sleep spindles (12-14Hz ), maximal fronto-central region.    IMPRESSION: This study is within normal limits. No seizures or epileptiform discharges were seen throughout the recording.   A normal interictal EEG does not exclude the diagnosis of epilepsy.     Prudencio Velazco [name]

## 2023-06-25 NOTE — Progress Notes (Signed)
LTM EEG hooked up and running - no initial skin breakdown - push button tested - [facility] monitoring.  

## 2023-06-26 ENCOUNTER — Encounter (HOSPITAL_COMMUNITY): Payer: Self-pay | Admitting: Internal Medicine

## 2023-06-26 DIAGNOSIS — R569 Unspecified convulsions: Secondary | ICD-10-CM | POA: Diagnosis not present

## 2023-06-26 LAB — BASIC METABOLIC PANEL
Anion gap: 8 (ref 5–15)
BUN: 9 mg/dL (ref 6–20)
CO2: 20 mmol/L — ABNORMAL LOW (ref 22–32)
Calcium: 8.8 mg/dL — ABNORMAL LOW (ref 8.9–10.3)
Chloride: 102 mmol/L (ref 98–111)
Creatinine, Ser: 0.57 mg/dL (ref 0.44–1.00)
GFR, Estimated: >60 mL/min (ref >60)
Glucose, Bld: 123 mg/dL — ABNORMAL HIGH (ref 70–99)
Potassium: 3.5 mmol/L (ref 3.5–5.1)
Sodium: 130 mmol/L — ABNORMAL LOW (ref 135–145)

## 2023-06-26 LAB — OSMOLALITY, URINE: Osmolality, Ur: 422 mosm/kg (ref 300–900)

## 2023-06-26 LAB — SODIUM, URINE, RANDOM: Sodium, Ur: 123 mmol/L

## 2023-06-26 MED ORDER — PYRIDOXINE HCL 100 MG/ML IJ SOLN
30.0000 mg | Freq: Once | INTRAMUSCULAR | Status: AC
  Administered 2023-06-26: 30 mg via INTRAVENOUS
  Filled 2023-06-26: qty 0.3

## 2023-06-26 MED ORDER — MAGNESIUM SULFATE 2 GM/50ML IV SOLN: 2.0000 g | Freq: Once | INTRAVENOUS | Status: DC

## 2023-06-26 MED ORDER — MAGNESIUM SULFATE 2 GM/50ML IV SOLN
2.0000 g | Freq: Once | INTRAVENOUS | Status: AC
  Administered 2023-06-26: 2 g via INTRAVENOUS
  Filled 2023-06-26: qty 50

## 2023-06-26 MED ORDER — PROCHLORPERAZINE EDISYLATE 10 MG/2ML IJ SOLN: 10.0000 mg | Freq: Once | INTRAMUSCULAR | Status: DC

## 2023-06-26 MED ORDER — LACTATED RINGERS IV BOLUS
1000.0000 mL | Freq: Once | INTRAVENOUS | Status: AC
  Administered 2023-06-26: 1000 mL via INTRAVENOUS

## 2023-06-26 MED ORDER — THIAMINE HCL 100 MG/ML IJ SOLN
100.0000 mg | Freq: Every day | INTRAMUSCULAR | Status: AC
  Administered 2023-06-26 – 2023-06-27 (×2): 100 mg via INTRAVENOUS
  Filled 2023-06-26 (×2): qty 2

## 2023-06-26 MED ORDER — SODIUM CHLORIDE 0.9 % IV SOLN
1.0000 mg | Freq: Once | INTRAVENOUS | Status: AC
  Administered 2023-06-26: 1 mg via INTRAVENOUS
  Filled 2023-06-26: qty 0.2

## 2023-06-26 MED ORDER — SODIUM CHLORIDE 0.9 % IV SOLN
12.5000 mg | Freq: Once | INTRAVENOUS | Status: AC
  Administered 2023-06-26: 12.5 mg via INTRAVENOUS
  Filled 2023-06-26: qty 12.5

## 2023-06-26 MED ORDER — POTASSIUM CHLORIDE 10 MEQ/100ML IV SOLN
10.0000 meq | INTRAVENOUS | Status: AC
  Administered 2023-06-26 (×4): 10 meq via INTRAVENOUS
  Filled 2023-06-26 (×4): qty 100

## 2023-06-26 MED ORDER — PROCHLORPERAZINE EDISYLATE 10 MG/2ML IJ SOLN
10.0000 mg | Freq: Once | INTRAMUSCULAR | Status: AC
  Administered 2023-06-26: 10 mg via INTRAVENOUS
  Filled 2023-06-26: qty 2

## 2023-06-26 MED ORDER — ENOXAPARIN SODIUM 60 MG/0.6ML IJ SOSY
50.0000 mg | PREFILLED_SYRINGE | INTRAMUSCULAR | Status: DC
  Administered 2023-06-27 – 2023-06-28 (×2): 50 mg via SUBCUTANEOUS
  Filled 2023-06-26 (×2): qty 0.6

## 2023-06-26 NOTE — Progress Notes (Signed)
EEG removal - no skin breakdown
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
## 2023-06-26 NOTE — Progress Notes (Addendum)
Education provided to patient on risk of smoking marijuana. Education provided that smoking is harmful to lungs. Education provided on heart failure risk with marijuana use. Patient states she is [redacted] weeks pregnant but is not keeping this pregnancy. She denies of resources for abortion services. States she has information. Oliver Barre, RN

## 2023-06-26 NOTE — Progress Notes (Signed)
Pt. Had a Seizure like activity that lasted for about 90 sec, no loss of LOC. EEG tech Clydie Braun called and report no change in monitor. M.P. Amada Jupiter notified.
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
## 2023-06-26 NOTE — Progress Notes (Signed)
Subjective: Had one typical event this morning. Confirmed that it was what happens at home per patient and fiance. Currently nauseous.  ROS: negative except above  Examination  Vital signs in last 24 hours: Temp:  [97.7 F (36.5 C)-98.7 F (37.1 C)] 98.4 F (36.9 C) (09/02 1613) Pulse Rate:  [83-98] 98 (09/02 1613) Resp:  [16-20] 18 (09/02 1613) BP: (108-130)/(60-90) 130/74 (09/02 1613) SpO2:  [98 %-100 %] 100 % (09/02 1613)  General: lying in bed, crying due to nausea  Neuro: MS: Alert, oriented, follows commands CN: pupils equal and reactive,  EOMI, face symmetric, tongue midline, normal sensation over face, Motor: 5/5 strength in all 4 extremities Reflexes: 2+ bilaterally over patella, biceps, plantars: flexor Coordination: normal Gait: not tested  Basic Metabolic Panel: Recent Labs  Lab 06/20/23 0748 06/21/23 0708 06/24/23 1640 06/25/23 0622 06/26/23 0403  NA 131* 133* 133* 130* 130*  K 4.2 3.8 3.7 3.5 3.5  CL 101 97* 97* 101 102  CO2 21* 22 23 21* 20*  GLUCOSE 92 95 89 95 123*  BUN 7 9 10 13 9   CREATININE 0.79 0.63 0.79 1.06* 0.57  CALCIUM 9.0 9.4 9.5 8.7* 8.8*    CBC: Recent Labs  Lab 06/20/23 0748 06/24/23 1640 06/25/23 0622  WBC 10.1 10.0 8.5  HGB 13.2 13.7 12.2  HCT 38.3 40.7 36.4  MCV 87.2 85.7 87.5  PLT 177 211 180     Coagulation Studies: No results for input(s): "LABPROT", "INR" in the last 72 hours.  Imaging No new brain imaging   ASSESSMENT AND PLAN: 32 year old pregnant female ( first trimester) presented with new onset seizure-like activity.   Seizure-like activity Psychogenic non-epileptic spells  Nausea due to pregnancy - Typical event on EEG without any EEG change consistent with non-epileptic event. No ictal-interictal abnormality  Recommendations - Now that we have confirmed non-epileptic spells, does not need to be on lamotrigine. Also discussed NOT to use rescue medication valtoco - Discussed diagnosis of non-epileptic  events, psych consults and CBT. Patient and fiance report understanding and agree with seeing Psych - Ok to dc ltm - Avoid benzos for these episodes and consult psych - Will order one time dose of phenergan for nausea, medicine team notified - Discussed plan with medicine team and RN  Seizure precautions: Per [facility] statutes, patients with seizures are not allowed to drive until they have been seizure-free for six months and cleared by a physician    Use caution when using heavy equipment or power tools. Avoid working on ladders or at heights. Take showers instead of baths. Ensure the water temperature is not too high on the home water heater. Do not go swimming alone. Do not lock yourself in a room alone (i.e. bathroom). When caring for infants or small children, sit down when holding, feeding, or changing them to minimize risk of injury to the child in the event you have a seizure. Maintain good sleep hygiene. Avoid alcohol.    If patient has another seizure, call 911 and bring them back to the ED if: A.  The seizure lasts longer than 5 minutes.      B.  The patient doesn't wake shortly after the seizure or has new problems such as difficulty seeing, speaking or moving following the seizure C.  The patient was injured during the seizure D.  The patient has a temperature over 102 F (39C) E.  The patient vomited during the seizure and now is having trouble breathing  I have spent a total of  38  minutes with the patient reviewing hospital notes,  test results, labs and examining the patient as well as establishing an assessment and plan that was discussed personally with the patient.  > 50% of time was spent in direct patient care.     Lindie Spruce Epilepsy [facility] For questions after 5pm please refer to AMION to reach the Neurologist on call

## 2023-06-26 NOTE — Procedures (Addendum)
Patient Name: Mercedes [name]  MRN: [id number]  Epilepsy Attending: Charlsie Quest  Referring Physician/Provider: Gust Rung, DO  Duration: 06/26/2023 0128 to 06/26/2023 1052   Patient history: 32 y.o. pregnant female, current regular marijuana smoker, with a PMHx of anemia, asthma, cannabinoid hyperemesis syndrome, hypokalemia and nephrolithiasis, who presents to the ED with new onset seizures. EEG to evaluate for seizure.   Level of alertness: Awake, asleep   AEDs during EEG study: None   Technical aspects: This EEG study was done with scalp electrodes positioned according to the 10-20 International system of electrode placement. Electrical activity was reviewed with band pass filter of 1-70Hz , sensitivity of 7 uV/mm, display speed of 41mm/sec with a 60Hz  notched filter applied as appropriate. EEG data were recorded continuously and digitally stored.  Video monitoring was available and reviewed as appropriate.   Description: The posterior dominant rhythm consists of 9 Hz activity of moderate voltage (25-35 uV) seen predominantly in posterior head regions, symmetric and reactive to eye opening and eye closing.  Sleep was characterized by vertex waves, sleep spindles (12-14Hz ), maximal fronto-central region.   One event was recorded on 06/26/2023 at 0609.  Patient was awake, looking towards left side.  Had right upper extremity jerking under the sheets.  This then progressed to subtle twitching of bilateral lower extremities.  Patient was not responding to staff.  Concomitant EEG before, during and after the event showed normal posterior dominant rhythm.   IMPRESSION: This study is within normal limits. No seizures or epileptiform discharges were seen throughout the recording.  One event was recorded on 06/26/2023 at 0609 as described above without concomitant EEG change.  This was a nonepileptic event.   Ranae Casebier [name]

## 2023-06-26 NOTE — Plan of Care (Signed)

## 2023-06-27 DIAGNOSIS — R569 Unspecified convulsions: Secondary | ICD-10-CM | POA: Diagnosis not present

## 2023-06-27 LAB — BASIC METABOLIC PANEL
Anion gap: 11 (ref 5–15)
BUN: 5 mg/dL — ABNORMAL LOW (ref 6–20)
CO2: 19 mmol/L — ABNORMAL LOW (ref 22–32)
Calcium: 8.7 mg/dL — ABNORMAL LOW (ref 8.9–10.3)
Chloride: 101 mmol/L (ref 98–111)
Creatinine, Ser: 0.56 mg/dL (ref 0.44–1.00)
GFR, Estimated: >60 mL/min (ref >60)
Glucose, Bld: 82 mg/dL (ref 70–99)
Potassium: 3.6 mmol/L (ref 3.5–5.1)
Sodium: 131 mmol/L — ABNORMAL LOW (ref 135–145)

## 2023-06-27 LAB — CBC
HCT: 33.3 % — ABNORMAL LOW (ref 36.0–46.0)
Hemoglobin: 11.1 g/dL — ABNORMAL LOW (ref 12.0–15.0)
MCH: 29.4 pg (ref 26.0–34.0)
MCHC: 33.3 g/dL (ref 30.0–36.0)
MCV: 88.3 fL (ref 80.0–100.0)
Platelets: 185 10*3/uL (ref 150–400)
RBC: 3.77 MIL/uL — ABNORMAL LOW (ref 3.87–5.11)
RDW: 12.5 % (ref 11.5–15.5)
WBC: 9.5 10*3/uL (ref 4.0–10.5)
nRBC: 0 % (ref 0.0–0.2)

## 2023-06-27 LAB — MAGNESIUM: Magnesium: 2 mg/dL (ref 1.7–2.4)

## 2023-06-27 MED ORDER — PYRIDOXINE HCL 100 MG/ML IJ SOLN: 30.0000 mg | Freq: Every day | INTRAMUSCULAR | Status: DC

## 2023-06-27 MED ORDER — B COMPLEX-C PO TABS
1.0000 | ORAL_TABLET | Freq: Every day | ORAL | Status: DC
  Administered 2023-06-28: 1 via ORAL
  Filled 2023-06-27 (×2): qty 1

## 2023-06-27 MED ORDER — PROCHLORPERAZINE EDISYLATE 10 MG/2ML IJ SOLN
10.0000 mg | Freq: Once | INTRAMUSCULAR | Status: AC
  Administered 2023-06-27: 10 mg via INTRAVENOUS
  Filled 2023-06-27: qty 2

## 2023-06-27 MED ORDER — SODIUM CHLORIDE 0.9 % IV SOLN
25.0000 mg | Freq: Four times a day (QID) | INTRAVENOUS | Status: DC | PRN
  Administered 2023-06-27 – 2023-06-28 (×2): 25 mg via INTRAVENOUS
  Filled 2023-06-27 (×2): qty 1

## 2023-06-27 MED ORDER — ONDANSETRON HCL 4 MG/2ML IJ SOLN
4.0000 mg | Freq: Four times a day (QID) | INTRAMUSCULAR | Status: DC
  Administered 2023-06-27 – 2023-06-28 (×5): 4 mg via INTRAVENOUS
  Filled 2023-06-27 (×5): qty 2

## 2023-06-27 MED ORDER — PANTOPRAZOLE SODIUM 20 MG PO TBEC
20.0000 mg | DELAYED_RELEASE_TABLET | Freq: Every day | ORAL | Status: DC
  Administered 2023-06-27 – 2023-06-28 (×2): 20 mg via ORAL
  Filled 2023-06-27 (×2): qty 1

## 2023-06-27 MED ORDER — SODIUM CHLORIDE 1 G PO TABS
2.0000 g | ORAL_TABLET | Freq: Three times a day (TID) | ORAL | Status: DC
  Administered 2023-06-27 – 2023-06-28 (×4): 2 g via ORAL
  Filled 2023-06-27 (×4): qty 2

## 2023-06-27 NOTE — Plan of Care (Signed)
  Problem: Safety: Goal: Ability to remain free from injury will improve Outcome: Progressing   

## 2023-06-27 NOTE — Progress Notes (Signed)
HD#2 SUBJECTIVE:  Patient Summary: Mercedes [name] is a 32 y.o. [phone] (~[redacted] weeks gestation) with a PMH of seizure-like activity, cannabinoid hyperemesis syndrome, and hyperemesis gravidarum who presented with a complaint of recurrent seizures after her previous admission on 8/25 - 8/28. HD #3   No concerns overnight. No PRN phenergine needed since yesterday morning. Report nausea is still significant and that she cannot eat. Updated her that we are establishing her with outpatient psychiatry (appointment confirmed for 9/9 @11 :30 via phone call).    OBJECTIVE:  Vital Signs: Vitals:   06/27/23 0421 06/27/23 0749 06/27/23 0810 06/27/23 1206  BP: 110/66 113/67  104/71  Pulse: 79 88  80  Resp: 18 (!) 28 17 20   Temp: 98 F (36.7 C) 98.3 F (36.8 C)  98.6 F (37 C)  TempSrc:  Oral  Oral  SpO2: 99% 99%  100%  Weight:      Height:       Intake/Output Summary (Last 24 hours) at 06/27/2023 1313 Last data filed at 06/27/2023 0750 Gross per 24 hour  Intake 1686.22 ml  Output 800 ml  Net 886.22 ml   Net IO Since Admission: 2,114.92 mL [06/27/23 1313]  Physical Exam: Physical Exam Constitutional:      Appearance: She is not diaphoretic.  Cardiovascular:     Rate and Rhythm: Regular rhythm. Tachycardia present.     Heart sounds: S1 normal and S2 normal. Heart sounds not distant. No murmur heard.    No S3 or S4 sounds.  Pulmonary:     Breath sounds: No decreased breath sounds, wheezing, rhonchi or rales.  Abdominal:     General: Bowel sounds are normal.  Musculoskeletal:     Right lower leg: No edema.     Left lower leg: No edema.  Neurological:     Mental Status: She is alert.     Patient Lines/Drains/Airways Status     Active Line/Drains/Airways     Name Placement date Placement time Site Days   Peripheral IV 06/24/23 20 G Left;Posterior Hand 06/24/23  1557  Hand  2           Labs Na 130 -> 131 Ur Na 123 Hemoglobin 12.2->11.1  ASSESSMENT/PLAN:   Assessment: Principal Problem:   Seizures (HCC) Active Problems:   Hyperemesis gravidarum   Obesity, Class III, BMI 40-49.9 (morbid obesity) (HCC)   [redacted] weeks gestation of pregnancy   Hyponatremia   Elevated serum creatinine   Plan: Mercedes [name] is a G9P9 32 y.o. on approx [redacted] weeks gestation with a PMH significant for seizure-like activity, cannabinoid hyperemesis syndrome, and hyperemesis gravidarum who presented with a complaint of recurrent seizures after her previous admission on 8/25 - 8/28. Readmitted for recurrent seizure-like activity. HD #3   Psychogenic non-epileptic seizures Per Dr Melynda Ripple epileptologist, pt had R upper extremity jerking and bilateral LE twitching. No changes on EEG and consistent with PNES. She educated pt on PNES. Recommends d/c lamotrigine and valtoco, avoidance of benzos for episodes, and consultation with psychiatry.  -- outpatient follow with [facility] mental health service 9/9 @11 :30 via phone for first visit -- consider inpatient psych consult if patient remains in hospital   [redacted] weeks gestation of pregnancy Hyperemesis gravidarum Tachycardia yesterday and received 2L LR. Not responsive to promethazine 25mg  PO and received compazine 10 mg once.currently PO fluid intake, no IV fluids. Net +565mL last 24hr. Eletrolytes within normal limits except hyponatremia.  -- started pantoprazole 20 mg once daily -- for persistent nausea,  started ondansetron 4 mg once every 6 hours, promethazine 25 mg once every 6 hours as needed, consider compazine 10 mg if not responding -- daily EKG to assess for QT prolongation -- continue B6, Folic acid, thiamine -- Monitor Na, K, Mg, and Ca -- if nausea persists, ondansetron 4 mg intravenously (IV push) once every eight hours for intravenous fluid therapy and may increase to 8 mg IV every eight hours if nausea persists. Can transition to PO pyridoxine after euvolemia if nausea persists.  -- Establish with outpatient OBGYN at  discharge   Hyponatremia Elevated serum creatinine Serum Osm borderline low 274. Ur Osm normal 629. Ur Na elevated 123. Euvolemic on exam.  Ddx includes SIADH v pregnancy related v primary adrenal insufficiency v hypovolemia - continue PO fluid intake -- Trend BMP -- checking am cortisol to rule out adrenal insufficiency   Noncardiac chest pain, resolved Atypical for cardiac pain.  Exam is reassuring.  EKG on admission with sinus tachycardia, no signs of ischemia.  Continue supportive therapy. Not requiring PRN tylenol.    Best Practice: Diet: Regular IVF: none VTE: enoxaparin (LOVENOX) injection 50 mg Start: 06/25/23 1000  Code: Full PT/OT recommendations: N/A TOC recommendations: N/A Family Update: At bedside  Meryl Dare, MD PGY-1 Psychiatry Resident 06/27/2023, 1:13 PM  Please contact the on call pager after 5 pm and on weekends at [phone].

## 2023-06-27 NOTE — TOC CM/SW Note (Signed)
Transition of Care [facility]) - Inpatient Brief Assessment   Patient Details  Name: Mercedes [name] MRN: [id number] Date of Birth: [birth year]  Transition of Care [facility]) CM/SW Contact:    Kermit Balo, RN Phone Number: 06/27/2023, 3:01 PM   Clinical Narrative: Pt without a PCP. [facility] is picking her up in the clinic. Appointment on AVS.   Transition of Care Asessment: Insurance and Status: Insurance coverage has been reviewed Patient has primary care physician: No Home environment has been reviewed: home with spouse   Prior/Current Home Services: No current home services Social Determinants of Health Reivew: SDOH reviewed no interventions necessary Readmission risk has been reviewed: Yes Transition of care needs: no transition of care needs at this time

## 2023-06-28 ENCOUNTER — Other Ambulatory Visit (HOSPITAL_COMMUNITY): Payer: Self-pay

## 2023-06-28 ENCOUNTER — Telehealth (HOSPITAL_COMMUNITY): Payer: Self-pay | Admitting: Pharmacy Technician

## 2023-06-28 DIAGNOSIS — R569 Unspecified convulsions: Secondary | ICD-10-CM | POA: Diagnosis not present

## 2023-06-28 LAB — RENAL FUNCTION PANEL
Albumin: 3.2 g/dL — ABNORMAL LOW (ref 3.5–5.0)
Anion gap: 11 (ref 5–15)
BUN: 5 mg/dL — ABNORMAL LOW (ref 6–20)
CO2: 18 mmol/L — ABNORMAL LOW (ref 22–32)
Calcium: 8.7 mg/dL — ABNORMAL LOW (ref 8.9–10.3)
Chloride: 102 mmol/L (ref 98–111)
Creatinine, Ser: 0.62 mg/dL (ref 0.44–1.00)
GFR, Estimated: >60 mL/min (ref >60)
Glucose, Bld: 74 mg/dL (ref 70–99)
Phosphorus: 4.2 mg/dL (ref 2.5–4.6)
Potassium: 3.5 mmol/L (ref 3.5–5.1)
Sodium: 131 mmol/L — ABNORMAL LOW (ref 135–145)

## 2023-06-28 LAB — CBC
HCT: 34.8 % — ABNORMAL LOW (ref 36.0–46.0)
Hemoglobin: 12 g/dL (ref 12.0–15.0)
MCH: 30 pg (ref 26.0–34.0)
MCHC: 34.5 g/dL (ref 30.0–36.0)
MCV: 87 fL (ref 80.0–100.0)
Platelets: 188 10*3/uL (ref 150–400)
RBC: 4 MIL/uL (ref 3.87–5.11)
RDW: 12.2 % (ref 11.5–15.5)
WBC: 10.4 10*3/uL (ref 4.0–10.5)
nRBC: 0 % (ref 0.0–0.2)

## 2023-06-28 LAB — CORTISOL-AM, BLOOD: Cortisol - AM: 9.7 ug/dL (ref 6.7–22.6)

## 2023-06-28 LAB — MAGNESIUM: Magnesium: 1.8 mg/dL (ref 1.7–2.4)

## 2023-06-28 LAB — GLUCOSE, CAPILLARY: Glucose-Capillary: 71 mg/dL (ref 70–99)

## 2023-06-28 MED ORDER — ONDANSETRON 4 MG PO TBDP
4.0000 mg | ORAL_TABLET | Freq: Three times a day (TID) | ORAL | 0 refills | Status: DC | PRN
  Filled 2023-06-28: qty 90, 30d supply, fill #0

## 2023-06-28 MED ORDER — PROCHLORPERAZINE EDISYLATE 10 MG/2ML IJ SOLN
10.0000 mg | Freq: Once | INTRAMUSCULAR | Status: AC
  Administered 2023-06-28: 10 mg via INTRAVENOUS
  Filled 2023-06-28: qty 2

## 2023-06-28 MED ORDER — PROCHLORPERAZINE 25 MG RE SUPP
25.0000 mg | Freq: Two times a day (BID) | RECTAL | 0 refills | Status: DC | PRN
Start: 2023-06-28 — End: 2023-12-23
  Filled 2023-06-28: qty 24, 12d supply, fill #0

## 2023-06-28 MED ORDER — PANTOPRAZOLE SODIUM 20 MG PO TBEC
20.0000 mg | DELAYED_RELEASE_TABLET | Freq: Every day | ORAL | 0 refills | Status: DC
Start: 2023-06-29 — End: 2023-12-23
  Filled 2023-06-28: qty 30, 30d supply, fill #0

## 2023-06-28 MED ORDER — LACTATED RINGERS IV BOLUS
500.0000 mL | Freq: Once | INTRAVENOUS | Status: AC
  Administered 2023-06-28: 500 mL via INTRAVENOUS

## 2023-06-28 MED ORDER — SERTRALINE HCL 50 MG PO TABS
50.0000 mg | ORAL_TABLET | Freq: Every day | ORAL | 0 refills | Status: DC
  Filled 2023-06-28: qty 30, 30d supply, fill #0

## 2023-06-28 MED ORDER — LACTATED RINGERS IV BOLUS: 500.0000 mL | Freq: Once | INTRAVENOUS | Status: DC

## 2023-06-28 NOTE — Plan of Care (Signed)

## 2023-06-28 NOTE — TOC Transition Note (Signed)
Transition of Care [facility]) - CM/SW Discharge Note   Patient Details  Name: Mercedes [name] MRN: [id number] Date of Birth: 1991/07/10  Transition of Care [facility]) CM/SW Contact:  Kermit Balo, RN Phone Number: 06/28/2023, 11:25 AM   Clinical Narrative:    Pt is discharging home with self care. No needs per TOC. Pt has transportation home.   Final next level of care: Home/Self Care Barriers to Discharge: No Barriers Identified   Patient Goals and CMS Choice      Discharge Placement                         Discharge Plan and Services Additional resources added to the After Visit Summary for                                       Social Determinants of Health (SDOH) Interventions SDOH Screenings   Food Insecurity: No Food Insecurity (06/25/2023)  Housing: Low Risk  (06/25/2023)  Transportation Needs: No Transportation Needs (06/25/2023)  Utilities: Not At Risk (06/25/2023)  Tobacco Use: Medium Risk (06/26/2023)     Readmission Risk Interventions     No data to display

## 2023-06-28 NOTE — Telephone Encounter (Signed)
Pharmacy Patient Advocate Encounter   Received notification that prior authorization for Prochlorperazine 25MG  suppositoriesis required/requested.   Insurance verification completed.   The patient is insured through [facility] [place] .   Per test claim: PA required; PA submitted to [facility]  Medicaid via CoverMyMeds Key/confirmation #/EOC [id number] Status is pending

## 2023-06-28 NOTE — Discharge Summary (Signed)
Name: Mercedes [name] MRN: [id number] DOB: 1991-07-15 32 y.o. PCP: Patient, No Pcp Per  Date of Admission: 06/24/2023  4:25 PM Date of Discharge: 06/28/23 Attending Physician: Dr. Criselda Peaches  Discharge Diagnosis: Principal Problem:   Seizures (HCC) Active Problems:   Hyperemesis gravidarum   Obesity, Class III, BMI 40-49.9 (morbid obesity) (HCC)   [redacted] weeks gestation of pregnancy   Hyponatremia   Elevated serum creatinine    Discharge Medications: Allergies as of 06/28/2023       Reactions   Penicillins Other (See Comments)   Reaction:  Unknown  Has patient had a PCN reaction causing immediate rash, facial/tongue/throat swelling, SOB or lightheadedness with hypotension: Unknown Has patient had a PCN reaction causing severe rash involving mucus membranes or skin necrosis: Unknown Has patient had a PCN reaction that required hospitalization: Unknown Has patient had a PCN reaction occurring within the last 10 years: No If all of the above answers are "NO", then may proceed with Cephalosporin use. Unknown reaction Childhood allergy        Medication List     STOP taking these medications    lamoTRIgine 25 MG tablet Commonly known as: LAMICTAL   multivitamin-prenatal 27-0.8 MG Tabs tablet   promethazine 12.5 MG tablet Commonly known as: PHENERGAN   Valtoco 20 MG Dose 10 MG/0.1ML Lqpk Generic drug: diazePAM (20 MG Dose)       TAKE these medications    folic acid 1 MG tablet Commonly known as: FOLVITE Take 1 tablet (1 mg total) by mouth daily.   ondansetron 4 MG disintegrating tablet Commonly known as: ZOFRAN-ODT Take 1 tablet (4 mg total) by mouth every 8 (eight) hours as needed for nausea or vomiting.   pantoprazole 20 MG tablet Commonly known as: PROTONIX Take 1 tablet (20 mg total) by mouth daily. Start taking on: June 29, 2023   prochlorperazine 25 MG suppository Commonly known as: COMPAZINE Place 1 suppository (25 mg total) rectally every 12  (twelve) hours as needed for nausea or vomiting. If not responding to other medications   sertraline 50 MG tablet Commonly known as: Zoloft Take 1 tablet (50 mg total) by mouth daily.        Disposition and follow-up:   [name] was discharged from [facility] in Stable condition.  At the hospital follow up visit please address:  1.  Follow-up:  a. PNES    b. Hyperemesis gravidarum, pregnancy   c. GAD, PTSD  2.  Labs / imaging needed at time of follow-up: AM cortisol (although low suspicion for adrenal insufficiency, more to work up hyponatremia fully)  3.  Pending labs/ test needing follow-up: none  4.  Medication Changes  Started on sertraline 50, has psych f/u  Follow-up Appointments: OBGYN: 9/7 with [facility] (another for [facility] 9/5 but she will cancel this one) Psychiatry: 9/9 with Loura Pardon Neurology: 9/26 with [facility] Course by problem list: Mercedes [name] is a 32 y.o. [phone] (~[redacted] weeks gestation) with a PMH of seizure-like activity, cannabinoid hyperemesis syndrome, and hyperemesis gravidarum who presented with a complaint of recurrent seizures after her previous admission on 8/25 - 8/28. 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 She was also diagnosed with GAD and PTSD, was started on zoloft 50, and has folllow up with psychiatry and therapy service both  at [facility]. She plans on terminating pregnancy and has follow up with OBGYN on Saturday with [facility].   PNES - follow up with neuro and psych - avoid antiepileptics and benzos - CBT available through psych o/p  Hyperemesis gravidarum, pregnancy - ondansetron ODT and compazine supposity if worsening - plan to terminate pregnancy and OBGYN appt scheduled  GAD, PTSD - started on zoloft  50 - follow up with psych for med management and therapy - discussed that patient could find benefit from trazadone for insomnia and hydroxyine for PRN anxiety but could need to start these following pregnancy.   Discharge Subjective: Nausea is undercontrol today. Full psychiatric evaluation revealed criteria for GAD and PTSD, which pt was agreeable to starting zoloft and following up with psych outpatient for med managmetn and therapy services. She is agreeable to treating her hyperemesis outpatient and is open to regimen for ondasatron and compazine suppository if she is not responding to zofran. Patient report hydrating normally at discharge, urinating frequently with light yellow urine, and eating okay when not in active nausea/vomiting episode in morningtime.   Discharge Exam:   BP (!) 140/82 (BP Location: Right Arm)   Pulse 96   Temp 98.7 F (37.1 C) (Oral)   Resp 18   Ht 5\' 4"  (1.626 m)   Wt 111 kg   LMP  (LMP Unknown)   SpO2 100%   BMI 42.00 kg/m  Constitutional: well-appearing,  sitting in bed, in no acute distress HENT: normocephalic atraumatic, mucous membranes moist Eyes: conjunctiva non-erythematous Neck: supple Cardiovascular: regular rate and rhythm, no m/r/g, euvolemic as evident by normal CRT, normal turgor, lack of orthostasis, wet oral mucosa.  Pulmonary/Chest: normal work of breathing on room air, lungs clear to auscultation bilaterally Abdominal: soft, non-tender, non-distended MSK: normal bulk and tone Neurological: alert & oriented x 3, 5/5 strength in bilateral upper and lower extremities, normal gait Skin: warm and dry Psych: mood is good. Affect is congruent. Calm and cooperative with exam. No SI, HI, AVH.    Pertinent Labs, Studies, and Procedures:     Latest Ref Rng & Units 06/28/2023   10:08 AM 06/27/2023    7:40 AM 06/25/2023    6:22 AM  CBC  WBC 4.0 - 10.5 K/uL 10.4  9.5  8.5   Hemoglobin 12.0 - 15.0 g/dL 16.1  09.6  04.5   Hematocrit 36.0 - 46.0 %  34.8  33.3  36.4   Platelets 150 - 400 K/uL 188  185  180        Latest Ref Rng & Units 06/27/2023    7:40 AM 06/26/2023    4:03 AM 06/25/2023    6:22 AM  CMP  Glucose 70 - 99 mg/dL 82  409  95   BUN 6 - 20 mg/dL 5  9  13    Creatinine 0.44 - 1.00 mg/dL 8.11  9.14  7.82   Sodium 135 - 145 mmol/L 131  130  130   Potassium 3.5 - 5.1 mmol/L 3.6  3.5  3.5   Chloride 98 - 111 mmol/L 101  102  101   CO2 22 - 32 mmol/L 19  20  21    Calcium 8.9 - 10.3 mg/dL 8.7  8.8  8.7     DG Chest Portable 1 View  Result Date: 06/24/2023 CLINICAL DATA:  Chest pain. EXAM: PORTABLE CHEST 1 VIEW COMPARISON:  12/09/2021 FINDINGS: The lungs are clear without focal pneumonia, edema, pneumothorax or pleural effusion. The cardiopericardial silhouette is within  normal limits for size. No acute bony abnormality. Telemetry leads overlie the chest. IMPRESSION: No active disease. Electronically Signed   By: Kennith Center M.D.   On: 06/24/2023 17:48     Discharge Instructions: Discharge Instructions     Diet general   Complete by: As directed        Signed: Meryl Dare, MD 06/28/2023, 11:55 AM   Pager: [phone]

## 2023-06-28 NOTE — Discharge Instructions (Addendum)
**  HI Mercedes [name] PLEASE READ THESE** Follow up with primary care provider Dr. Versie Starks, neurologist Dr. Teresa Coombs, and your OBGYN appointment Psychiatry follow up on Monday 9/9 @11 :30 via phone call -- let them know you want therapy services (trauma-focused and/or CBT). They can also add the medications trazadone (sleep) and hydroxyine (anxiety as needed). Allow 4-6 weeks for the zoloft (sertraline) to be at full effects. It is associated with GI side effects and sexual dysfunction in a small number of people, and you may feel "different" mentally until the 4-6 week mark and this is normal.  For nausea, take the ondansetron first every 8 hours as needed. Take the compazine suppository every 12 hours as needed. It is okay to overlap the zofran and compazine. If you are having issues drinking liquid and are significantly dehydrate, call primary care [phone]) and ask for on call provider. You can also go to the emergency department.

## 2023-06-28 NOTE — Progress Notes (Signed)
VSS. Respirations are even and unlabored. NAD. RA. Patient denies CP or SOB. PIV removed, tip intact.    Discharge education/instructions provided to patient by Enid Derry, RN [facility]). All personal belongings and AVS sent with patient.    Patient picking up TOC medications at the outpatient pharmacy. Patient's spouse discharging patient home via private vehicle.

## 2023-06-28 NOTE — Progress Notes (Addendum)
RN paged team to evaluate patient for anxiety. At bedside, a new bag of phenergan is infusing and patient is laying comfortably in bed. She reports nausea, vomiting, and a headache along with decreased appetite and poor oral intake. The tray next to her bed has dinner and snacks not eaten. Patient denied anxiety. Her main concern was the nausea and headache. Her symptoms can be contributed to dehydration from vomiting and poor oral intake. Both patient and RN updated on plan below.  Plan: -500 cc LR -Phenergan for nausea, currently receiving -Tylenol 650 mg for headache -follow up on BMP in AM    Faith Rogue, DO Internal Medicine Resident: PGY-1  Please contact the on call pager after 5pm and during the weekends: [phone]

## 2023-06-28 NOTE — Telephone Encounter (Signed)
Pharmacy Patient Advocate Encounter  Received notification from [facility] Medicaid that Prior Authorization for Prochlorperazine 25MG  suppositories  has been APPROVED from 06/28/2023 to 06/27/2024   PA #/Case ID/Reference #: [id number]

## 2023-06-28 NOTE — Progress Notes (Signed)
Notified by RN that patient experienced a witnessed "seizure like activity ".  Per RN note, patient pushed her call bell and when the nurse went to the room, the patient had left upper extremity and upper torso shaking accompanied by left hand "balled into a fist".  The patient did not desaturate during this episode and it lasted for about 50 seconds. Vitals per RN: BP=126/97. HR=81. POX= RR=17. POX=100% RA   We evaluated the patient at bedside, patient was laying in bed comfortably.  We spoke with the patient and she stated that she had an uncomfortable feeling in her abdomen described as "riding a roller coaster" that was followed by muscle cramping of her extremities and an episode of PNES.  Per patient, this is the feeling that she is having prior to her other PNES episodes.  She is aware that the EEG was consistent with PNES and not epilepsy.  Patient shared that the stress is due to pregnancy and hyperemesis gravidarum.  She stated that prior to the pregnancy, she was not feeling this level of stress.  She stated that she lives at home with her 3 daughters and the father of her children.  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  While patient was sharing the increased stress that she has been experiencing from this pregnancy, she became tearful because she is tired of the muscle cramping and feeling so ill. Patient was educated on the benefits of starting an antidepressant and therapy, she is agreeable.  Plan -Patient is currently receiving IVF, Phenergan, and received Tylenol. -No acute changes the plan -Please follow up on antidepressants and therapy in the morning  -Will follow-up on RFP and additional electrolyte studies to further evaluate cramping.  -TSH was evaluated on 9/1:0.240, unlikely the cause of muscle cramps  -Consider PT evaluation or providing patient with stretches for the muscle cramps: pt is  likely having functional deconditioning while laying in the bed all day     -Consider heat vs ice for conservative therapy

## 2023-06-28 NOTE — Progress Notes (Signed)
Dr. Hessie Diener was made aware that th pt had a seizure like episode at 6:09:30-6:12:39 and at 6:13:54-6:15:00. She had bilat fist clinching BUE and total body jerking with dazed, glassy staring into space look. IMTS night and today's day team came to assess the pt.

## 2023-06-28 NOTE — Progress Notes (Signed)
Dr. Hessie Diener was made aware that ptt pushed her call bell. By the time I walked to her room from the nursing station she was having what appears to be a seizure. Her left hand was balled up into a fist. LUE and upper torso was shaking. She was staring straight ahead looking dazed and confused. Post ictal she was not as alert, slightly drowsy, but oriented X3. BP=126/97. HR=81. POX=  RR=17. POX=100% RA. Seizure like activity was from 3:11:05 -3:11:55.

## 2023-06-29 ENCOUNTER — Emergency Department (HOSPITAL_COMMUNITY)
Admission: EM | Admit: 2023-06-29 | Discharge: 2023-06-29 | Disposition: A | Discharge status: Home / Self Care | Payer: Medicaid Other | Attending: Emergency Medicine | Admitting: Emergency Medicine

## 2023-06-29 ENCOUNTER — Other Ambulatory Visit: Payer: Self-pay

## 2023-06-29 ENCOUNTER — Encounter (HOSPITAL_COMMUNITY): Payer: Self-pay

## 2023-06-29 DIAGNOSIS — O219 Vomiting of pregnancy, unspecified: Secondary | ICD-10-CM | POA: Insufficient documentation

## 2023-06-29 DIAGNOSIS — R569 Unspecified convulsions: Secondary | ICD-10-CM | POA: Diagnosis not present

## 2023-06-29 DIAGNOSIS — Z3A09 9 weeks gestation of pregnancy: Secondary | ICD-10-CM | POA: Insufficient documentation

## 2023-06-29 LAB — CBC WITH DIFFERENTIAL/PLATELET
Abs Immature Granulocytes: 0.04 10*3/uL (ref 0.00–0.07)
Basophils Absolute: 0 10*3/uL (ref 0.0–0.1)
Basophils Relative: 0 %
Eosinophils Absolute: 0 10*3/uL (ref 0.0–0.5)
Eosinophils Relative: 0 %
HCT: 36.4 % (ref 36.0–46.0)
Hemoglobin: 12 g/dL (ref 12.0–15.0)
Immature Granulocytes: 0 %
Lymphocytes Relative: 12 %
Lymphs Abs: 1.5 10*3/uL (ref 0.7–4.0)
MCH: 29.3 pg (ref 26.0–34.0)
MCHC: 33 g/dL (ref 30.0–36.0)
MCV: 89 fL (ref 80.0–100.0)
Monocytes Absolute: 0.9 10*3/uL (ref 0.1–1.0)
Monocytes Relative: 7 %
Neutro Abs: 9.9 10*3/uL — ABNORMAL HIGH (ref 1.7–7.7)
Neutrophils Relative %: 81 %
Platelets: 208 10*3/uL (ref 150–400)
RBC: 4.09 MIL/uL (ref 3.87–5.11)
RDW: 12.2 % (ref 11.5–15.5)
WBC: 12.3 10*3/uL — ABNORMAL HIGH (ref 4.0–10.5)
nRBC: 0 % (ref 0.0–0.2)

## 2023-06-29 LAB — MAGNESIUM: Magnesium: 1.7 mg/dL (ref 1.7–2.4)

## 2023-06-29 LAB — COMPREHENSIVE METABOLIC PANEL
ALT: 12 U/L (ref 0–44)
AST: 19 U/L (ref 15–41)
Albumin: 3.3 g/dL — ABNORMAL LOW (ref 3.5–5.0)
Alkaline Phosphatase: 70 U/L (ref 38–126)
Anion gap: 10 (ref 5–15)
BUN: 7 mg/dL (ref 6–20)
CO2: 19 mmol/L — ABNORMAL LOW (ref 22–32)
Calcium: 8.7 mg/dL — ABNORMAL LOW (ref 8.9–10.3)
Chloride: 103 mmol/L (ref 98–111)
Creatinine, Ser: 0.55 mg/dL (ref 0.44–1.00)
GFR, Estimated: >60 mL/min (ref >60)
Glucose, Bld: 76 mg/dL (ref 70–99)
Potassium: 4 mmol/L (ref 3.5–5.1)
Sodium: 132 mmol/L — ABNORMAL LOW (ref 135–145)
Total Bilirubin: 1.1 mg/dL (ref 0.3–1.2)
Total Protein: 6.8 g/dL (ref 6.5–8.1)

## 2023-06-29 LAB — HCG, SERUM, QUALITATIVE: Preg, Serum: POSITIVE — AB

## 2023-06-29 MED ORDER — PROCHLORPERAZINE MALEATE 10 MG PO TABS: 10.0000 mg | ORAL_TABLET | Freq: Two times a day (BID) | ORAL | 0 refills | Status: DC | PRN

## 2023-06-29 MED ORDER — METOCLOPRAMIDE HCL 5 MG/ML IJ SOLN
10.0000 mg | Freq: Once | INTRAMUSCULAR | Status: AC
  Administered 2023-06-29: 10 mg via INTRAVENOUS
  Filled 2023-06-29: qty 2

## 2023-06-29 MED ORDER — LACTATED RINGERS IV BOLUS
1000.0000 mL | Freq: Once | INTRAVENOUS | Status: AC
  Administered 2023-06-29: 1000 mL via INTRAVENOUS

## 2023-06-29 NOTE — ED Notes (Signed)
PT just started staring in blank space, no response to her name and then had another seizure. PA was called in and witnessed it as well. Airway was protected and seizure lasted for about 1 min. PT woke up and started saying, "I want to go home". Gave PT a warm blanket and reassured her that her kids would be fine. Husband is bedside.

## 2023-06-29 NOTE — ED Triage Notes (Signed)
Pt arrives ems to the er for the c/o seizure this afternoon for around 30 seconds. Pt was postictal on arrival. VSS per ems. First known seizure was last week, pt is [redacted] weeks pregnant.

## 2023-06-29 NOTE — Discharge Instructions (Addendum)
You were seen for your seizure in the emergency department.   At home, please stay well-hydrated and take your nausea medication.    Check your MyChart online for the results of any tests that had not resulted by the time you left the emergency department.   Follow-up with your OB/GYN in 2-3 days regarding your visit.  Follow-up with your neurologist.  Return immediately to the emergency department if you experience any of the following: Seizures lasting more than 5 minutes, back-to-back seizures without going back to your normal self, or any other concerning symptoms.    Thank you for visiting our Emergency Department. It was a pleasure taking care of you today.

## 2023-06-29 NOTE — ED Notes (Signed)
PT passed her PO test

## 2023-06-29 NOTE — ED Notes (Signed)
Called into the room by PT's Bf after pt was having another seizure. Me, another RN and tech witnessed the end of the seizure.PT's eyes were closed and PT was shaking.AC (RN) was able to place a 18G in her left arm.PA was bedside and Jarold Motto MD was notified.

## 2023-06-29 NOTE — ED Provider Notes (Signed)
[place] EMERGENCY DEPARTMENT AT [facility] Provider Note   CSN: [id number] Arrival date & time: 06/29/23  1832     History  Chief Complaint  Patient presents with   Seizures    Mercedes [name] is a 32 y.o. female.  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  Patient called her sister today after having a seizure and was talking to her when her sister reports that she dropped the phone and started hearing grunting.  She presumed that she had another seizure so her sister called 911.  Husband is at the bedside and provides additional history.  Was discharged yesterday after being admitted for seizure-like activity.  Was ultimately diagnosed with PNES after she had EEG that did not show any epileptic activity with her seizure-like episodes.  Was also treated for hyperemesis gravidarum.  Says that she has been having persistent nausea and vomiting since being discharged home.       Home Medications Prior to Admission medications   Medication Sig Start Date End Date Taking? Authorizing Provider  prochlorperazine (COMPAZINE) 10 MG tablet Take 1 tablet (10 mg total) by mouth 2 (two) times daily as needed for up to 15 doses for nausea or vomiting. 06/29/23  Yes Rondel Baton, MD  folic acid (FOLVITE) 1 MG tablet Take 1 tablet (1 mg total) by mouth daily. 06/21/23 07/21/23  Inez Catalina, MD  ondansetron (ZOFRAN-ODT) 4 MG disintegrating tablet Take 1 tablet (4 mg total) by mouth every 8 (eight) hours as needed for nausea or vomiting. 06/28/23   Inez Catalina, MD  pantoprazole (PROTONIX) 20 MG tablet Take 1 tablet (20 mg total) by mouth daily. 06/29/23   Inez Catalina, MD  prochlorperazine (COMPAZINE) 25 MG suppository Place 1 suppository (25 mg total) rectally every 12 (twelve) hours as needed for nausea or  vomiting. If not responding to other medications 06/28/23   Inez Catalina, MD  sertraline (ZOLOFT) 50 MG tablet Take 1 tablet (50 mg total) by mouth daily. 06/28/23 06/27/24  Inez Catalina, MD      Allergies    Penicillins    Review of Systems   Review of Systems  Physical Exam Updated Vital Signs BP (!) 149/92 (BP Location: Right Arm)   Pulse 86   Temp 98.4 F (36.9 C) (Oral)   Resp 20   Ht 5\' 4"  (1.626 m)   Wt 111 kg   LMP  (LMP Unknown)   SpO2 100%   BMI 42.00 kg/m  Physical Exam Vitals and nursing note reviewed.  Constitutional:      General: She is not in acute distress.    Appearance: She is well-developed.  HENT:     Head: Normocephalic and atraumatic.     Right Ear: External ear normal.     Left Ear: External ear normal.     Nose: Nose normal.  Eyes:     Extraocular Movements: Extraocular movements intact.     Conjunctiva/sclera: Conjunctivae normal.     Pupils: Pupils are equal, round, and reactive to light.     Comments: 4 mm bilaterally  Pulmonary:     Effort: Pulmonary effort is normal. No respiratory distress.  Musculoskeletal:     Cervical back: Normal range of motion and neck supple.     Right lower leg: No edema.     Left lower leg:  No edema.  Skin:    General: Skin is warm and dry.  Neurological:     Mental Status: She is alert and oriented to person, place, and time. Mental status is at baseline.     Comments: MENTAL STATUS: AAOx3 CRANIAL NERVES: II: Pupils equal and reactive, no RAPD, no VF deficits III, IV, VI: EOM intact, no gaze preference or deviation, no nystagmus. V: normal sensation to light touch in V1, V2, and V3 segments bilaterally VII: no facial weakness or asymmetry, no nasolabial fold flattening VIII: normal hearing to speech and finger friction IX, X: normal palatal elevation, no uvular deviation XI: 5/5 head turn and 5/5 shoulder shrug bilaterally XII: midline tongue protrusion MOTOR: 5/5 strength in R shoulder flexion, elbow  flexion and extension, and grip strength. 5/5 strength in L shoulder flexion, elbow flexion and extension, and grip strength.  5/5 strength in R hip and knee flexion, knee extension, ankle plantar and dorsiflexion. 5/5 strength in L hip and knee flexion, knee extension, ankle plantar and dorsiflexion. SENSORY: Normal sensation to light touch in all extremities COORD: Normal finger to nose and heel to shin, no tremor, no dysmetria STATION: No truncal ataxia   Psychiatric:        Mood and Affect: Mood normal.     ED Results / Procedures / Treatments   Labs (all labs ordered are listed, but only abnormal results are displayed) Labs Reviewed  COMPREHENSIVE METABOLIC PANEL - Abnormal; Notable for the following components:      Result Value   Sodium 132 (*)    CO2 19 (*)    Calcium 8.7 (*)    Albumin 3.3 (*)    All other components within normal limits  HCG, SERUM, QUALITATIVE - Abnormal; Notable for the following components:   Preg, Serum POSITIVE (*)    All other components within normal limits  CBC WITH DIFFERENTIAL/PLATELET - Abnormal; Notable for the following components:   WBC 12.3 (*)    Neutro Abs 9.9 (*)    All other components within normal limits  MAGNESIUM  CBC WITH DIFFERENTIAL/PLATELET    EKG EKG Interpretation Date/Time:  Thursday June 29 2023 18:39:08 EDT Ventricular Rate:  80 PR Interval:  146 QRS Duration:  90 QT Interval:  374 QTC Calculation: 432 R Axis:   80  Text Interpretation: Sinus rhythm Confirmed by Vonita Moss [phone]) on 06/29/2023 7:40:21 PM  Radiology No results found.  Procedures Procedures    Medications Ordered in ED Medications  lactated ringers bolus 1,000 mL (0 mLs Intravenous Stopped 06/29/23 2211)  metoCLOPramide (REGLAN) injection 10 mg (10 mg Intravenous Given 06/29/23 2041)    ED Course/ Medical Decision Making/ A&P Clinical Course as of 06/30/23 1240  Thu Jun 29, 2023  2009 Had 2.5 generalized tonic clonic siezure  [RP]  2113 Had seizure like episode again lasted for several minutes. Eyes were closed. Generalized seizure.  [RP]    Clinical Course User Index [RP] Rondel Baton, MD                                 Medical Decision Making Amount and/or Complexity of Data Reviewed Labs: ordered.  Risk Prescription drug management.   Mercedes [name] is a 32 y.o. female with comorbidities that complicate the patient evaluation including PNES, hyperemesis gravidarum, cannabinoid hyperemesis, GAD, PTSD, G4P3 at approximately [redacted] weeks gestation who presents to the emergency department after seizure-like episode.  Initial Ddx:  Seizure, nonepileptic seizure, hyperemesis gravidarum, electrolyte abnormality, dehydration  MDM/Course:  Patient presents to the emergency department after having 2 possible seizures at home.  Is somewhat interesting that after the first seizure she was able to call her sister and was not more postictal.  Has a nonfocal neuroexam here today.  Has been having some nausea and vomiting so patient was given fluids as well as antiemetics.  Did not have any significant electrolyte abnormalities.  Patient did have 2 additional seizure-like episodes in the emergency department.  They were witnessed by her significant other and were described as generalized tonic-clonic shaking with her eyes closed.  Is able to evaluate the patient after each of these episodes and she was immediately alert and oriented x 3.  Did not appear to be postictal.  After receiving the antiemetics she was able to eat.  Feel that her seizures here again have been consistent with nonepileptic episodes.  Will have her follow-up with neurology and psychiatry regarding her symptoms.  Was able to tolerate p.o. prior to discharge.  Did refill her prescription of Compazine.  This patient presents to the ED for concern of complaints listed in HPI, this involves an extensive number of treatment options, and is a complaint  that carries with it a high risk of complications and morbidity. Disposition including potential need for admission considered.   Dispo: DC Home. Return precautions discussed including, but not limited to, those listed in the AVS. Allowed pt time to ask questions which were answered fully prior to dc.  Additional history obtained from spouse Records reviewed Outpatient Clinic Notes The following labs were independently interpreted: Chemistry and show nonanion gap metabolic acidosis consistent with GI losses I personally reviewed and interpreted cardiac monitoring: normal sinus rhythm  I personally reviewed and interpreted the pt's EKG: see above for interpretation  I have reviewed the patients home medications and made adjustments as needed   Final Clinical Impression(s) / ED Diagnoses Final diagnoses:  Seizure-like activity (HCC)  Nausea and vomiting during pregnancy    Rx / DC Orders ED Discharge Orders          Ordered    prochlorperazine (COMPAZINE) 10 MG tablet  2 times daily PRN        06/29/23 2259              Rondel Baton, MD 06/30/23 1240

## 2023-07-05 ENCOUNTER — Other Ambulatory Visit: Payer: Self-pay

## 2023-07-05 ENCOUNTER — Emergency Department (HOSPITAL_COMMUNITY)
Admission: EM | Admit: 2023-07-05 | Discharge: 2023-07-05 | Discharge status: Against Medical Advice | Payer: Medicaid Other | Attending: Emergency Medicine | Admitting: Emergency Medicine

## 2023-07-05 ENCOUNTER — Encounter (HOSPITAL_COMMUNITY): Payer: Self-pay | Admitting: Emergency Medicine

## 2023-07-05 DIAGNOSIS — Z5329 Procedure and treatment not carried out because of patient's decision for other reasons: Secondary | ICD-10-CM | POA: Insufficient documentation

## 2023-07-05 DIAGNOSIS — O99511 Diseases of the respiratory system complicating pregnancy, first trimester: Secondary | ICD-10-CM | POA: Insufficient documentation

## 2023-07-05 DIAGNOSIS — O210 Mild hyperemesis gravidarum: Secondary | ICD-10-CM | POA: Diagnosis not present

## 2023-07-05 DIAGNOSIS — R569 Unspecified convulsions: Secondary | ICD-10-CM | POA: Diagnosis not present

## 2023-07-05 DIAGNOSIS — Z3A08 8 weeks gestation of pregnancy: Secondary | ICD-10-CM | POA: Insufficient documentation

## 2023-07-05 DIAGNOSIS — J45909 Unspecified asthma, uncomplicated: Secondary | ICD-10-CM | POA: Insufficient documentation

## 2023-07-05 DIAGNOSIS — O99351 Diseases of the nervous system complicating pregnancy, first trimester: Secondary | ICD-10-CM | POA: Diagnosis present

## 2023-07-05 HISTORY — DX: Unspecified convulsions: R56.9

## 2023-07-05 LAB — CBC WITH DIFFERENTIAL/PLATELET
Abs Immature Granulocytes: 0.05 10*3/uL (ref 0.00–0.07)
Basophils Absolute: 0 10*3/uL (ref 0.0–0.1)
Basophils Relative: 0 %
Eosinophils Absolute: 0 10*3/uL (ref 0.0–0.5)
Eosinophils Relative: 0 %
HCT: 42.4 % (ref 36.0–46.0)
Hemoglobin: 13.6 g/dL (ref 12.0–15.0)
Immature Granulocytes: 0 %
Lymphocytes Relative: 14 %
Lymphs Abs: 1.7 10*3/uL (ref 0.7–4.0)
MCH: 28.8 pg (ref 26.0–34.0)
MCHC: 32.1 g/dL (ref 30.0–36.0)
MCV: 89.8 fL (ref 80.0–100.0)
Monocytes Absolute: 0.8 10*3/uL (ref 0.1–1.0)
Monocytes Relative: 6 %
Neutro Abs: 9.9 10*3/uL — ABNORMAL HIGH (ref 1.7–7.7)
Neutrophils Relative %: 80 %
Platelets: 251 10*3/uL (ref 150–400)
RBC: 4.72 MIL/uL (ref 3.87–5.11)
RDW: 12.9 % (ref 11.5–15.5)
WBC: 12.4 10*3/uL — ABNORMAL HIGH (ref 4.0–10.5)
nRBC: 0 % (ref 0.0–0.2)

## 2023-07-05 MED ORDER — PANTOPRAZOLE SODIUM 40 MG IV SOLR: 40.0000 mg | Freq: Once | INTRAVENOUS | Status: DC

## 2023-07-05 MED ORDER — LACTATED RINGERS IV BOLUS
1000.0000 mL | Freq: Once | INTRAVENOUS | Status: AC
  Administered 2023-07-05: 1000 mL via INTRAVENOUS

## 2023-07-05 MED ORDER — METOCLOPRAMIDE HCL 5 MG/ML IJ SOLN
10.0000 mg | Freq: Once | INTRAMUSCULAR | Status: AC
  Administered 2023-07-05: 10 mg via INTRAVENOUS
  Filled 2023-07-05: qty 2

## 2023-07-05 NOTE — ED Provider Notes (Signed)
[place] EMERGENCY DEPARTMENT AT [facility] Provider Note   CSN: [id number] Arrival date & time: 07/05/23  1021     History  Chief Complaint  Patient presents with   Seizures    Mercedes [name] is a 32 y.o. female.  Patient is a 33 year old female with a history of asthma, anemia, seizures who is currently [redacted] weeks pregnant and has had significant morning sickness with recurrent nausea vomiting and inability to hold down her medications who is presenting to the emergency room today after having a seizure.  Patient was with her sister and her sister reports for 30 seconds patient became unresponsive with sonorous respirations.  Patient does not remember any of this is currently just complaining of feeling miserable having pain all over and still feeling nauseated.  She has not had any vaginal bleeding or urinary complaints.  She is planning to abort the fetus on Friday but reports she has already had an ultrasound that is confirmed an IUP.  She is supposed to be on lamotrigine but has not been taking it because of the vomiting.  Patient is complaining of a burning pain in her abdomen and her chest associated with all the vomiting.  The history is provided by the patient.  Seizures      Home Medications Prior to Admission medications   Medication Sig Start Date End Date Taking? Authorizing Provider  folic acid (FOLVITE) 1 MG tablet Take 1 tablet (1 mg total) by mouth daily. 06/21/23 07/21/23  Inez Catalina, MD  ondansetron (ZOFRAN-ODT) 4 MG disintegrating tablet Take 1 tablet (4 mg total) by mouth every 8 (eight) hours as needed for nausea or vomiting. 06/28/23   Inez Catalina, MD  pantoprazole (PROTONIX) 20 MG tablet Take 1 tablet (20 mg total) by mouth daily. 06/29/23   Inez Catalina, MD  prochlorperazine (COMPAZINE) 10 MG tablet Take 1 tablet (10 mg total) by mouth 2 (two) times daily as needed for up to 15 doses for nausea or vomiting. 06/29/23   Rondel Baton, MD   prochlorperazine (COMPAZINE) 25 MG suppository Place 1 suppository (25 mg total) rectally every 12 (twelve) hours as needed for nausea or vomiting. If not responding to other medications 06/28/23   Inez Catalina, MD  sertraline (ZOLOFT) 50 MG tablet Take 1 tablet (50 mg total) by mouth daily. 06/28/23 06/27/24  Inez Catalina, MD      Allergies    Penicillins    Review of Systems   Review of Systems  Neurological:  Positive for seizures.    Physical Exam Updated Vital Signs BP 106/73   Pulse 92   Temp 98.2 F (36.8 C) (Oral)   Resp 13   Ht 5\' 4"  (1.626 m)   Wt 110.7 kg   LMP  (LMP Unknown)   SpO2 96%   BMI 41.88 kg/m  Physical Exam Vitals and nursing note reviewed.  Constitutional:      General: She is not in acute distress.    Appearance: She is well-developed.  HENT:     Head: Normocephalic and atraumatic.  Eyes:     Pupils: Pupils are equal, round, and reactive to light.  Cardiovascular:     Rate and Rhythm: Normal rate and regular rhythm.     Heart sounds: Normal heart sounds. No murmur heard.    No friction rub.  Pulmonary:     Effort: Pulmonary effort is normal.     Breath sounds: Normal breath sounds. No wheezing  or rales.  Abdominal:     General: Bowel sounds are normal. There is no distension.     Palpations: Abdomen is soft.     Tenderness: There is abdominal tenderness. There is no guarding or rebound.     Comments: Mild diffuse tenderness  Musculoskeletal:        General: No tenderness. Normal range of motion.     Comments: No edema  Skin:    General: Skin is warm and dry.     Findings: No rash.  Neurological:     Mental Status: She is alert and oriented to person, place, and time.     Cranial Nerves: No cranial nerve deficit.     Sensory: No sensory deficit.     Motor: No weakness.     Gait: Gait normal.  Psychiatric:        Behavior: Behavior normal.     ED Results / Procedures / Treatments   Labs (all labs ordered are listed, but only  abnormal results are displayed) Labs Reviewed  CBC WITH DIFFERENTIAL/PLATELET  COMPREHENSIVE METABOLIC PANEL    EKG EKG Interpretation Date/Time:  Wednesday July 05 2023 10:31:23 EDT Ventricular Rate:  95 PR Interval:  133 QRS Duration:  100 QT Interval:  361 QTC Calculation: 454 R Axis:   86  Text Interpretation: Sinus rhythm No significant change since last tracing Confirmed by Gwyneth Sprout ([id number]) on 07/05/2023 10:50:00 AM  Radiology No results found.  Procedures Procedures    Medications Ordered in ED Medications  pantoprazole (PROTONIX) injection 40 mg (has no administration in time range)  lactated ringers bolus 1,000 mL (1,000 mLs Intravenous New Bag/Given 07/05/23 1046)  metoCLOPramide (REGLAN) injection 10 mg (10 mg Intravenous Given 07/05/23 1046)    ED Course/ Medical Decision Making/ A&P                                 Medical Decision Making Amount and/or Complexity of Data Reviewed Labs: ordered. ECG/medicine tests: ordered and independent interpretation performed. Decision-making details documented in ED Course.  Risk Prescription drug management.   Pt with multiple medical problems and comorbidities and presenting today with a complaint that caries a high risk for morbidity and mortality.  Here today with recurrent symptoms most classic for morning sickness now with complaints of chest and abdominal burning and being unable to eat or drink since Sunday.  Suspect patient's seizure was related to stress of poor oral intake, dehydration and not taking her medication.  Patient is planning to abort the fetus on Friday but denies any bleeding and low suspicion for an acute pregnancy related problem.  Patient's blood pressure is normal no episodes of hypertension.  This would be too early for preeclampsia.  Patient has also had a confirmatory ultrasound and has an IUP low suspicion for ectopic at this time.  Will check electrolytes, give IV fluids and PPI.   Suspect patient's burning pain in her chest and abdomen are related to the recurrent nausea and vomiting.  No abdominal findings to suggest cholecystitis, pancreatitis, appendicitis or diverticulitis.  No findings suggestive of perforation.  11:24 AM Patient suddenly reports she needs to leave.  Her brother has been shot and she has to go now.  Discussed with her that we do not have her blood work back and she has had abnormal electrolytes in the past it is possible she could have another seizure or has abnormalities.  She acknowledges  that there could be a problem but still needs to go and signed out AMA.  Did discuss with patient if she was having further problems she can always return later.  Patient was able to drink ginger ale before leaving. I independently interpreted her EKG which showed no acute changes.        Final Clinical Impression(s) / ED Diagnoses Final diagnoses:  Seizure-like activity (HCC)  Hyperemesis gravidarum    Rx / DC Orders ED Discharge Orders     None         Gwyneth Sprout, MD 07/05/23 1124

## 2023-07-05 NOTE — ED Notes (Signed)
Got patient into a gown on the monitor did EKG shown to Dr Anitra Lauth patient is resting with call bell in reach

## 2023-07-05 NOTE — ED Triage Notes (Addendum)
Per [facility] pt coming from sisters house for witnessed seizure, approx 30 seconds. Pt gave her valtoken nasal spray. Patient is [redacted] weeks pregnant. C/o burning sensation to chest and abdomen x 3 days. Has not eaten and drank anything since Sunday. Has not seen neurology and is unsure if she has an appt with them. Patient states "she feels horrible and is hurting everywhere."   Patient adds she has no OB care stating she is getting an abortion soon.

## 2023-07-05 NOTE — ED Notes (Signed)
Pt A/O x4 VSS. Pt signed AMA. EDP aware and have already spoken to patient.

## 2023-07-05 NOTE — ED Notes (Signed)
Patient found by registration standing at sink drinking out of it.

## 2023-07-05 NOTE — ED Notes (Signed)
ED Provider at bedside. 

## 2023-07-07 ENCOUNTER — Encounter: Payer: Medicaid Other | Admitting: Student

## 2023-07-20 ENCOUNTER — Inpatient Hospital Stay: Payer: Medicaid Other | Admitting: Neurology

## 2023-07-20 ENCOUNTER — Encounter: Payer: Self-pay | Admitting: Neurology

## 2023-07-24 ENCOUNTER — Encounter: Payer: Medicaid Other | Admitting: Obstetrics and Gynecology

## 2023-12-23 ENCOUNTER — Encounter (HOSPITAL_COMMUNITY): Payer: Self-pay

## 2023-12-23 ENCOUNTER — Ambulatory Visit (HOSPITAL_COMMUNITY)
Admission: EM | Admit: 2023-12-23 | Discharge: 2023-12-23 | Disposition: A | Discharge status: Home / Self Care | Attending: Emergency Medicine | Admitting: Emergency Medicine

## 2023-12-23 DIAGNOSIS — K047 Periapical abscess without sinus: Secondary | ICD-10-CM

## 2023-12-23 MED ORDER — CEFDINIR 300 MG PO CAPS: 300.0000 mg | ORAL_CAPSULE | Freq: Two times a day (BID) | ORAL | 0 refills | Status: AC

## 2023-12-23 NOTE — ED Provider Notes (Signed)
 [facility]    CSN: [id number] Arrival date & time: 12/23/23  1136     History   Chief Complaint Chief Complaint  Patient presents with   Dental Pain    HPI Mercedes [name] is a 33 y.o. female.  3 day history of right sided dental pain Located top and bottom jaw. She does have cracked tooth on bottom in vicinity of pain. Pain is rated 10/10 currently She has used ibuprofen, tylenol, orajel, and mouthwash without relief No fevers. Denies facial swelling   Past Medical History:  Diagnosis Date   Anemia    Asthma    Hypokalemia    Kidney stone 2018   Seizures [facility])     Patient Active Problem List   Diagnosis Date Noted   [redacted] weeks gestation of pregnancy 06/25/2023   Hyponatremia 06/25/2023   Elevated serum creatinine 06/25/2023   Seizures (HCC) 06/24/2023   Seizure (HCC) 06/16/2023   Multifocal pneumonia 12/11/2021   Thrombocytopenia (HCC) 12/11/2021   Influenza A with pneumonia 12/10/2021   Obesity, Class III, BMI 40-49.9 (morbid obesity) (HCC) 12/10/2021   Tobacco abuse 12/10/2021   SVD (spontaneous vaginal delivery) 08/08/2017   Post-dates pregnancy 08/06/2017   Supervision of high risk pregnancy, antepartum 06/08/2017   Insufficient prenatal care in third trimester 06/08/2017   Substance abuse affecting pregnancy in third trimester, antepartum (HCC) 06/08/2017   Obesity in pregnancy with antepartum complication 06/08/2017   Obesity (BMI 30-39.9) 06/08/2017   Hyperemesis gravidarum 06/08/2017   Asthma exacerbation 02/27/2016   Anemia in pregnancy 02/27/2016   Hypokalemia 02/27/2016   Asthma affecting pregnancy, antepartum 02/27/2016    Past Surgical History:  Procedure Laterality Date   NO PAST SURGERIES     WISDOM TOOTH EXTRACTION      OB History     Gravida  8   Para  3   Term  3   Preterm  0   AB  3   Living  3      SAB  2   IAB  1   Ectopic  0   Multiple  0   Live Births  3        Obstetric Comments  G1: 2014  3430gm SVD G2: 01/2014 3195gm SVD/2nd          Home Medications    Prior to Admission medications   Medication Sig Start Date End Date Taking? Authorizing Provider  cefdinir (OMNICEF) 300 MG capsule Take 1 capsule (300 mg total) by mouth 2 (two) times daily for 7 days. 12/23/23 12/30/23 Yes Yahia Bottger, Lurena Joiner, PA-C    Family History Family History  Problem Relation Age of Onset   Cancer Mother        ovarian   Asthma Mother     Social History Social History   Tobacco Use   Smoking status: Former    Current packs/day: 0.00    Types: Cigarettes    Quit date: 2021    Years since quitting: 4.1   Smokeless tobacco: Never   Tobacco comments:    with +preg test  Vaping Use   Vaping status: Never Used  Substance Use Topics   Alcohol use: No   Drug use: Yes    Types: Marijuana    Comment: last used a week ago     Allergies   Penicillins   Review of Systems Review of Systems Per HPI  Physical Exam Triage Vital Signs ED Triage Vitals  Encounter Vitals Group  BP 12/23/23 1207 133/84     Systolic BP Percentile --      Diastolic BP Percentile --      Pulse Rate 12/23/23 1207 79     Resp 12/23/23 1207 16     Temp 12/23/23 1207 98.3 F (36.8 C)     Temp Source 12/23/23 1207 Oral     SpO2 12/23/23 1207 97 %     Weight 12/23/23 1207 260 lb (117.9 kg)     Height 12/23/23 1207 5\' 5"  (1.651 m)     Head Circumference --      Peak Flow --      Pain Score 12/23/23 1206 10     Pain Loc --      Pain Education --      Exclude from Growth Chart --    No data found.  Updated Vital Signs BP 133/84 (BP Location: Left Arm)   Pulse 79   Temp 98.3 F (36.8 C) (Oral)   Resp 16   Ht 5\' 5"  (1.651 m)   Wt 260 lb (117.9 kg)   LMP 12/08/2023 (Approximate)   SpO2 97%   Breastfeeding No   BMI 43.27 kg/m    Physical Exam Vitals and nursing note reviewed.  Constitutional:      General: She is not in acute distress.    Appearance: Normal appearance.  HENT:     Head:      Comments: No facial swelling    Mouth/Throat:     Mouth: Mucous membranes are moist.     Dentition: Abnormal dentition. Dental tenderness present. No gingival swelling.     Pharynx: Oropharynx is clear.      Comments: Tender over cracked tooth on bottom right jaw, and upper right jaw although no cracked tooth there Cardiovascular:     Rate and Rhythm: Normal rate and regular rhythm.     Heart sounds: Normal heart sounds.  Pulmonary:     Effort: Pulmonary effort is normal.     Breath sounds: Normal breath sounds.  Neurological:     Mental Status: She is alert and oriented to person, place, and time.     UC Treatments / Results  Labs (all labs ordered are listed, but only abnormal results are displayed) Labs Reviewed - No data to display  EKG  Radiology No results found.  Procedures Procedures   Medications Ordered in UC Medications - No data to display  Initial Impression / Assessment and Plan / UC Course  I have reviewed the triage vital signs and the nursing notes.  Pertinent labs & imaging results that were available during my care of the patient were reviewed by me and considered in my medical decision making (see chart for details).  Cover for dental infection with cefdinir twice daily for 7 days.  Patient has history of penicillin allergy.  Reports it happened when she was very young, unknown what the reaction was.  She has taken cefdinir in the past without issue. She does have a dentist, discussed will contact them Monday for follow-up  Final Clinical Impressions(s) / UC Diagnoses   Final diagnoses:  Dental infection     Discharge Instructions      Please take medication as prescribed. Take with food to avoid upset stomach. Finish the full course! It may take 2-3 days to start working. Continue symptomatic care for pain in the meantime.  Follow up with dentist when able     ED Prescriptions     Medication Sig  Dispense Auth. Provider   cefdinir  (OMNICEF) 300 MG capsule Take 1 capsule (300 mg total) by mouth 2 (two) times daily for 7 days. 14 capsule Chessa Barrasso, Lurena Joiner, PA-C      PDMP not reviewed this encounter.   Priscilla Kirstein, Lurena Joiner, [place] 12/23/23 1248

## 2023-12-23 NOTE — ED Triage Notes (Signed)
 Patient presenting with dental pain along the top and bottom of the right side of the mouth onset 3 days ago.  Patient states she does have a cracked tooth on the bottom right of the mouth.  Prescriptions or OTC medications tried: Yes- Ibuprofen, Tylenol, oragel, medicated mouthwash otc    with no relief

## 2023-12-23 NOTE — Discharge Instructions (Addendum)
 Please take medication as prescribed. Take with food to avoid upset stomach. Finish the full course! It may take 2-3 days to start working. Continue symptomatic care for pain in the meantime.  Follow up with dentist when able

## 2024-04-10 ENCOUNTER — Emergency Department (HOSPITAL_COMMUNITY)

## 2024-04-10 ENCOUNTER — Encounter (HOSPITAL_COMMUNITY): Payer: Self-pay | Admitting: Internal Medicine

## 2024-04-10 ENCOUNTER — Inpatient Hospital Stay (HOSPITAL_COMMUNITY)
Admission: EM | Admit: 2024-04-10 | Discharge: 2024-04-14 | DRG: 189 | Disposition: A | Discharge status: Home / Self Care | Attending: Internal Medicine | Admitting: Internal Medicine

## 2024-04-10 ENCOUNTER — Other Ambulatory Visit: Payer: Self-pay

## 2024-04-10 DIAGNOSIS — R1110 Vomiting, unspecified: Secondary | ICD-10-CM | POA: Diagnosis present

## 2024-04-10 DIAGNOSIS — Z87442 Personal history of urinary calculi: Secondary | ICD-10-CM

## 2024-04-10 DIAGNOSIS — E876 Hypokalemia: Secondary | ICD-10-CM | POA: Diagnosis not present

## 2024-04-10 DIAGNOSIS — J45901 Unspecified asthma with (acute) exacerbation: Principal | ICD-10-CM | POA: Diagnosis present

## 2024-04-10 DIAGNOSIS — R000 Tachycardia, unspecified: Secondary | ICD-10-CM | POA: Diagnosis present

## 2024-04-10 DIAGNOSIS — F411 Generalized anxiety disorder: Secondary | ICD-10-CM | POA: Insufficient documentation

## 2024-04-10 DIAGNOSIS — J441 Chronic obstructive pulmonary disease with (acute) exacerbation: Secondary | ICD-10-CM | POA: Diagnosis present

## 2024-04-10 DIAGNOSIS — D649 Anemia, unspecified: Secondary | ICD-10-CM | POA: Diagnosis present

## 2024-04-10 DIAGNOSIS — J9601 Acute respiratory failure with hypoxia: Principal | ICD-10-CM

## 2024-04-10 DIAGNOSIS — Z880 Allergy status to penicillin: Secondary | ICD-10-CM

## 2024-04-10 DIAGNOSIS — J4521 Mild intermittent asthma with (acute) exacerbation: Secondary | ICD-10-CM

## 2024-04-10 DIAGNOSIS — J40 Bronchitis, not specified as acute or chronic: Secondary | ICD-10-CM

## 2024-04-10 DIAGNOSIS — J209 Acute bronchitis, unspecified: Secondary | ICD-10-CM

## 2024-04-10 DIAGNOSIS — K219 Gastro-esophageal reflux disease without esophagitis: Secondary | ICD-10-CM | POA: Diagnosis present

## 2024-04-10 DIAGNOSIS — Z825 Family history of asthma and other chronic lower respiratory diseases: Secondary | ICD-10-CM

## 2024-04-10 DIAGNOSIS — G40909 Epilepsy, unspecified, not intractable, without status epilepticus: Secondary | ICD-10-CM | POA: Diagnosis present

## 2024-04-10 DIAGNOSIS — F17210 Nicotine dependence, cigarettes, uncomplicated: Secondary | ICD-10-CM | POA: Diagnosis not present

## 2024-04-10 DIAGNOSIS — Z1152 Encounter for screening for COVID-19: Secondary | ICD-10-CM

## 2024-04-10 LAB — CBC WITH DIFFERENTIAL/PLATELET
Abs Immature Granulocytes: 0.04 10*3/uL (ref 0.00–0.07)
Basophils Absolute: 0 10*3/uL (ref 0.0–0.1)
Basophils Relative: 0 %
Eosinophils Absolute: 0 10*3/uL (ref 0.0–0.5)
Eosinophils Relative: 0 %
HCT: 45.6 % (ref 36.0–46.0)
Hemoglobin: 14.9 g/dL (ref 12.0–15.0)
Immature Granulocytes: 0 %
Lymphocytes Relative: 13 %
Lymphs Abs: 1.7 10*3/uL (ref 0.7–4.0)
MCH: 29.2 pg (ref 26.0–34.0)
MCHC: 32.7 g/dL (ref 30.0–36.0)
MCV: 89.2 fL (ref 80.0–100.0)
Monocytes Absolute: 1 10*3/uL (ref 0.1–1.0)
Monocytes Relative: 8 %
Neutro Abs: 10.5 10*3/uL — ABNORMAL HIGH (ref 1.7–7.7)
Neutrophils Relative %: 79 %
Platelets: 221 10*3/uL (ref 150–400)
RBC: 5.11 MIL/uL (ref 3.87–5.11)
RDW: 12.7 % (ref 11.5–15.5)
WBC: 13.4 10*3/uL — ABNORMAL HIGH (ref 4.0–10.5)
nRBC: 0 % (ref 0.0–0.2)

## 2024-04-10 LAB — HCG, SERUM, QUALITATIVE: Preg, Serum: NEGATIVE

## 2024-04-10 LAB — BASIC METABOLIC PANEL WITH GFR
Anion gap: 17 — ABNORMAL HIGH (ref 5–15)
BUN: 11 mg/dL (ref 6–20)
CO2: 17 mmol/L — ABNORMAL LOW (ref 22–32)
Calcium: 9.3 mg/dL (ref 8.9–10.3)
Chloride: 104 mmol/L (ref 98–111)
Creatinine, Ser: 0.92 mg/dL (ref 0.44–1.00)
GFR, Estimated: >60 mL/min (ref >60)
Glucose, Bld: 149 mg/dL — ABNORMAL HIGH (ref 70–99)
Potassium: 3 mmol/L — ABNORMAL LOW (ref 3.5–5.1)
Sodium: 138 mmol/L (ref 135–145)

## 2024-04-10 LAB — RESP PANEL BY RT-PCR (RSV, FLU A&B, COVID)  RVPGX2
Influenza A by PCR: NEGATIVE
Influenza B by PCR: NEGATIVE
Resp Syncytial Virus by PCR: NEGATIVE
SARS Coronavirus 2 by RT PCR: NEGATIVE

## 2024-04-10 LAB — BRAIN NATRIURETIC PEPTIDE: B Natriuretic Peptide: 3.4 pg/mL (ref 0.0–100.0)

## 2024-04-10 MED ORDER — METHYLPREDNISOLONE SODIUM SUCC 40 MG IJ SOLR
40.0000 mg | Freq: Two times a day (BID) | INTRAMUSCULAR | Status: DC
  Administered 2024-04-11 – 2024-04-14 (×7): 40 mg via INTRAVENOUS
  Filled 2024-04-10 (×8): qty 1

## 2024-04-10 MED ORDER — METOPROLOL TARTRATE 5 MG/5ML IV SOLN: 2.5000 mg | INTRAVENOUS | Status: DC | PRN

## 2024-04-10 MED ORDER — SODIUM CHLORIDE 0.9 % IV SOLN
250.0000 mL | INTRAVENOUS | Status: AC | PRN
Start: 2024-04-10 — End: 2024-04-11

## 2024-04-10 MED ORDER — METHYLPREDNISOLONE SODIUM SUCC 125 MG IJ SOLR
125.0000 mg | Freq: Once | INTRAMUSCULAR | Status: AC
  Administered 2024-04-10: 125 mg via INTRAVENOUS
  Filled 2024-04-10: qty 2

## 2024-04-10 MED ORDER — MAGNESIUM SULFATE 2 GM/50ML IV SOLN
2.0000 g | Freq: Once | INTRAVENOUS | Status: AC
  Administered 2024-04-10: 2 g via INTRAVENOUS
  Filled 2024-04-10: qty 50

## 2024-04-10 MED ORDER — ONDANSETRON HCL 4 MG PO TABS: 4.0000 mg | ORAL_TABLET | Freq: Four times a day (QID) | ORAL | Status: DC | PRN

## 2024-04-10 MED ORDER — PANTOPRAZOLE SODIUM 40 MG PO TBEC
40.0000 mg | DELAYED_RELEASE_TABLET | Freq: Every day | ORAL | Status: DC
  Administered 2024-04-11 – 2024-04-14 (×4): 40 mg via ORAL
  Filled 2024-04-10 (×5): qty 1

## 2024-04-10 MED ORDER — GUAIFENESIN ER 600 MG PO TB12
600.0000 mg | ORAL_TABLET | Freq: Two times a day (BID) | ORAL | Status: DC
  Administered 2024-04-11 – 2024-04-14 (×8): 600 mg via ORAL
  Filled 2024-04-10 (×8): qty 1

## 2024-04-10 MED ORDER — SODIUM CHLORIDE 0.9% FLUSH: 3.0000 mL | INTRAVENOUS | Status: DC | PRN

## 2024-04-10 MED ORDER — ONDANSETRON HCL 4 MG/2ML IJ SOLN: 4.0000 mg | Freq: Four times a day (QID) | INTRAMUSCULAR | Status: DC | PRN

## 2024-04-10 MED ORDER — POTASSIUM CHLORIDE CRYS ER 20 MEQ PO TBCR
40.0000 meq | EXTENDED_RELEASE_TABLET | ORAL | Status: AC
  Administered 2024-04-11: 40 meq via ORAL
  Filled 2024-04-10: qty 2

## 2024-04-10 MED ORDER — FLUTICASONE FUROATE-VILANTEROL 100-25 MCG/ACT IN AEPB: 1.0000 | INHALATION_SPRAY | Freq: Every day | RESPIRATORY_TRACT | Status: DC

## 2024-04-10 MED ORDER — ACETAMINOPHEN 650 MG RE SUPP: 650.0000 mg | Freq: Four times a day (QID) | RECTAL | Status: DC | PRN

## 2024-04-10 MED ORDER — ENOXAPARIN SODIUM 40 MG/0.4ML IJ SOSY
40.0000 mg | PREFILLED_SYRINGE | INTRAMUSCULAR | Status: DC
  Administered 2024-04-11 – 2024-04-13 (×3): 40 mg via SUBCUTANEOUS
  Filled 2024-04-10 (×3): qty 0.4

## 2024-04-10 MED ORDER — SODIUM CHLORIDE 0.9% FLUSH: 3.0000 mL | Freq: Two times a day (BID) | INTRAVENOUS | Status: DC

## 2024-04-10 MED ORDER — ALBUTEROL SULFATE (2.5 MG/3ML) 0.083% IN NEBU
10.0000 mg/h | INHALATION_SOLUTION | Freq: Once | RESPIRATORY_TRACT | Status: AC
  Administered 2024-04-10: 10 mg/h via RESPIRATORY_TRACT
  Filled 2024-04-10: qty 12

## 2024-04-10 MED ORDER — ALBUTEROL SULFATE (2.5 MG/3ML) 0.083% IN NEBU: 2.5000 mg | INHALATION_SOLUTION | Freq: Four times a day (QID) | RESPIRATORY_TRACT | Status: DC | PRN

## 2024-04-10 MED ORDER — ACETAMINOPHEN 325 MG PO TABS
650.0000 mg | ORAL_TABLET | Freq: Four times a day (QID) | ORAL | Status: DC | PRN
Start: 2024-04-10 — End: 2024-04-14
  Administered 2024-04-11 – 2024-04-13 (×3): 650 mg via ORAL
  Filled 2024-04-10 (×4): qty 2

## 2024-04-10 MED ORDER — SODIUM CHLORIDE 0.9% FLUSH
3.0000 mL | Freq: Two times a day (BID) | INTRAVENOUS | Status: DC
  Administered 2024-04-11 – 2024-04-14 (×8): 3 mL via INTRAVENOUS

## 2024-04-10 MED ORDER — FLUTICASONE FUROATE-VILANTEROL 100-25 MCG/ACT IN AEPB: 1.0000 | INHALATION_SPRAY | Freq: Two times a day (BID) | RESPIRATORY_TRACT | Status: DC

## 2024-04-10 NOTE — H&P (Addendum)
 History and Physical    Mercedes [name] FMW:[id number] DOB: [birth year]   PCP: Pcp, No   Patient coming from: Home   Chief Complaint:  Chief Complaint  Patient presents with   Shortness of Breath   ED TRIAGE note:BIB [facility] from home, patient came in with shortness of breath that started yesterday with fatigue, fever, lightheadedness that has been going on for one week. Patient has a history of asthma     En route patient received   Duoneb x1 Albuterol  x1  HPI:  Mercedes [name] is a 33 y.o. female with medical history significant of asthma,  generalized anxiety disorder patient presented to emergency department complaining of shortness of breath with associated fever, lightheadedness, fatigue which is ongoing for 1 week.  In the office the patient has been given DuoNeb and albuterol  x 1. Patient reported she has persistent worsening shortness of breath with exertion with associated mucoid cough and wheezing.  Reported to smoking cigarettes on daily basis for almost last 10 years. Currently patient denies any fever, chill, abdominal pain, nausea and vomiting.  No other complaint at this time.    ED Course:  At presentation to ED patient found tachycardic heart rate 135, tachypneic 24, O2 sat 89% room air.  Blood pressure within good range.  EKG showed sinus tachycardia heart rate 127. Chest x-ray no active disease process.  Respiratory panel negative for COVID/RSV flu. BMP showing low potassium 3 low bicarb 17 elevated anion gap 17. CBC showing leukocytosis 13.4. Pregnancy test negative. Normal BNP 3.4.   In the ED patient has been given albuterol  and methylprednisolone .  Hospitalist has been consulted for further management of acute exacerbation.   Significant labs in the ED: Lab Orders         Resp panel by RT-PCR (RSV, Flu A&B, Covid) Anterior Nasal Swab         Culture, blood (routine x 2)         Basic metabolic panel         CBC with  Differential/Platelet         hCG, serum, qualitative         Brain natriuretic peptide         CBC         Basic metabolic panel       Review of Systems:  Review of Systems  Constitutional:  Negative for chills, fever, malaise/fatigue and weight loss.  Respiratory:  Positive for cough, sputum production, shortness of breath and wheezing.   Cardiovascular:  Negative for chest pain and palpitations.  Gastrointestinal:  Negative for abdominal pain, diarrhea, heartburn, nausea and vomiting.  Neurological:  Negative for dizziness and headaches.  Psychiatric/Behavioral:  The patient is not nervous/anxious.     Past Medical History:  Diagnosis Date   Anemia    Asthma    Hypokalemia    Kidney stone 2018   Seizures (HCC)     Past Surgical History:  Procedure Laterality Date   NO PAST SURGERIES     WISDOM TOOTH EXTRACTION       reports that she quit smoking about 4 years ago. Her smoking use included cigarettes. She has never used smokeless tobacco. She reports current drug use. Drug: Marijuana. She reports that she does not drink alcohol.  Allergies  Allergen Reactions   Penicillins Other (See Comments)    Reaction:  Unknown  Has patient had a PCN reaction causing immediate rash, facial/tongue/throat swelling, SOB or lightheadedness with hypotension: Unknown  Has patient had a PCN reaction causing severe rash involving mucus membranes or skin necrosis: Unknown Has patient had a PCN reaction that required hospitalization: Unknown Has patient had a PCN reaction occurring within the last 10 years: No If all of the above answers are NO, then may proceed with Cephalosporin use. Unknown reaction Childhood allergy    Family History  Problem Relation Age of Onset   Cancer Mother        ovarian   Asthma Mother     Prior to Admission medications   Not on File     Physical Exam: Vitals:   04/10/24 2100 04/10/24 2130 04/10/24 2200 04/10/24 2300  BP: 114/75   120/69   Pulse: (!) 124   (!) 117  Resp:  20    Temp:      TempSrc:      SpO2: 95%  (!) 89% 91%  Weight:      Height:        Physical Exam Vitals and nursing note reviewed.  HENT:     Mouth/Throat:     Mouth: Mucous membranes are moist.   Cardiovascular:     Rate and Rhythm: Regular rhythm. Tachycardia present.  Pulmonary:     Effort: Pulmonary effort is normal. No tachypnea, accessory muscle usage or respiratory distress.     Breath sounds: No stridor. Wheezing present. No decreased breath sounds, rhonchi or rales.   Musculoskeletal:     Cervical back: Neck supple.   Skin:    Capillary Refill: Capillary refill takes less than 2 seconds.   Neurological:     Mental Status: She is alert and oriented to person, place, and time.   Psychiatric:        Mood and Affect: Mood normal.      Labs on Admission: I have personally reviewed following labs and imaging studies  CBC: Recent Labs  Lab 04/10/24 1955  WBC 13.4*  NEUTROABS 10.5*  HGB 14.9  HCT 45.6  MCV 89.2  PLT 221   Basic Metabolic Panel: Recent Labs  Lab 04/10/24 1955  NA 138  K 3.0*  CL 104  CO2 17*  GLUCOSE 149*  BUN 11  CREATININE 0.92  CALCIUM 9.3   GFR: Estimated Creatinine Clearance: 107.1 mL/min (by C-G formula based on SCr of 0.92 mg/dL). Liver Function Tests: No results for input(s): AST, ALT, ALKPHOS, BILITOT, PROT, ALBUMIN in the last 168 hours. No results for input(s): LIPASE, AMYLASE in the last 168 hours. No results for input(s): AMMONIA in the last 168 hours. Coagulation Profile: No results for input(s): INR, PROTIME in the last 168 hours. Cardiac Enzymes: No results for input(s): CKTOTAL, CKMB, CKMBINDEX, TROPONINI, TROPONINIHS in the last 168 hours. BNP (last 3 results) Recent Labs    04/10/24 1955  BNP 3.4   HbA1C: No results for input(s): HGBA1C in the last 72 hours. CBG: No results for input(s): GLUCAP in the last 168 hours. Lipid  Profile: No results for input(s): CHOL, HDL, LDLCALC, TRIG, CHOLHDL, LDLDIRECT in the last 72 hours. Thyroid Function Tests: No results for input(s): TSH, T4TOTAL, FREET4, T3FREE, THYROIDAB in the last 72 hours. Anemia Panel: No results for input(s): VITAMINB12, FOLATE, FERRITIN, TIBC, IRON , RETICCTPCT in the last 72 hours. Urine analysis:    Component Value Date/Time   COLORURINE AMBER (A) 06/24/2023 1745   APPEARANCEUR HAZY (A) 06/24/2023 1745   LABSPEC 1.026 06/24/2023 1745   PHURINE 5.0 06/24/2023 1745   GLUCOSEU NEGATIVE 06/24/2023 1745   HGBUR NEGATIVE 06/24/2023  1745   BILIRUBINUR SMALL (A) 06/24/2023 1745   KETONESUR 5 (A) 06/24/2023 1745   PROTEINUR 30 (A) 06/24/2023 1745   UROBILINOGEN 0.2 11/23/2017 1541   NITRITE NEGATIVE 06/24/2023 1745   LEUKOCYTESUR SMALL (A) 06/24/2023 1745    Radiological Exams on Admission: I have personally reviewed images DG Chest Port 1 View Result Date: 04/10/2024 CLINICAL DATA:  Short of breath EXAM: PORTABLE CHEST 1 VIEW COMPARISON:  06/24/2023 FINDINGS: The heart size and mediastinal contours are within normal limits. Both lungs are clear. The visualized skeletal structures are unremarkable. IMPRESSION: No active disease. Electronically Signed   By: Ozell Daring M.D.   On: 04/10/2024 19:08     EKG: My personal interpretation of EKG shows: Sinus tachycardia heart rate 127, nonspecific ST of abnormality, normal QTc.    Assessment/Plan: Principal Problem:   Acute asthma exacerbation Active Problems:   Asthma, chronic obstructive, with acute exacerbation (HCC)   Acute bronchitis   Hypokalemia   GAD (generalized anxiety disorder)   Sinus tachycardia   Continuous dependence on cigarette smoking    Assessment and Plan: Asthma acute exacerbation Acute bronchitis History of chronic smoking cigarette and concern for underlying COPD exacerbation as well (no PFT in the past) -Patient present emergency  department complaining of shortness of breath, cough which has been progressively getting worse over the course of last 1 week.  Patient reported fever and brought up cough few days ago now developing shortness of breath.  At home patient is not any longer short acting bronchodilator.  At this addition to ED O2 sat dropped to 89% room air, tachycardic, tachypneic, blood pressure within good range. - And route to ED via EMS patient has been given DuoNeb and albuterol  nebulizer. -In the ED patient has been given albuterol  nebulizer and IV methylprednisolone  and mag sulfate 2 g. Patient reported history of smoking for 10 years.  She smokes 1 and half pack cigarettes per day. -Concern for both asthma exacerbation and underlying COPD exacerbation as well in the setting of chronic smoking cigarette - Continue DuoNeb every 6 hours scheduled and every 4 hour as needed for wheezing shortness of breath.  Continue IV Solu-Medrol  40 mg twice daily.  Starting IV azithromycin , continue Mucinex  and supportive care. Continue supplemental oxygen and wean down to room air as patient tolerates.   Hypokalemia Low potassium 3.  Replating with oral KCl 40 mEq.  Sinus tachycardia -Versus sinus tachycardia in the setting of asthma exacerbation.  Continue IV metoprolol  as needed.   Chronic smoking cigarette. - Patient reported she has been smoking cigarette 1 and half pack daily almost for 10 years.  Counseled patient at the bedside for smoking cessation.  Continue nicotine  patch.   DVT prophylaxis:  Lovenox  and SCD. Code Status:  Full Code Diet: Regular diet Disposition Plan: Contributory improvement of wheezing and shortness of breath. Admission status:   Observation, Telemetry bed  Severity of Illness: The appropriate patient status for this patient is OBSERVATION. Observation status is judged to be reasonable and necessary in order to provide the required intensity of service to ensure the patient's safety.  The patient's presenting symptoms, physical exam findings, and initial radiographic and laboratory data in the context of their medical condition is felt to place them at decreased risk for further clinical deterioration. Furthermore, it is anticipated that the patient will be medically stable for discharge from the hospital within 2 midnights of admission.     Mercedes Whitmoyer, MD [facility]  How to contact the  [facility] Attending or Consulting provider 7A - 7P or covering provider during after hours 7P -7A, for this patient.  Check the care team in [facility] and look for a) attending/consulting [facility] provider listed and b) the [facility] team listed Log into [email or web address] and use [facility]'s universal password to access. If you do not have the password, please contact the hospital operator. Locate the [facility] provider you are looking for under [facility] and page to a number that you can be directly reached. If you still have difficulty reaching the provider, please page the [facility] (Director on Call) for the Hospitalists listed on amion for assistance.  04/11/2024, 12:13 AM

## 2024-04-10 NOTE — ED Provider Notes (Signed)
 [place] EMERGENCY DEPARTMENT AT [facility] Provider Note   CSN: [id number] Arrival date & time: 04/10/24  1752     Patient presents with: Shortness of Breath   Mercedes [name] is a 33 y.o. female.  She has a history of seizure disorder, asthma.  She said she had a fever and body aches a few days ago.  Then it moved into her chest and she has been having nonproductive cough and shortness of breath, chest pressure for the last 2 days.  Breathing treatments did not help.  Ultimately called EMS today.  She was given some breathing treatments en route.  Was hypoxic and placed on oxygen.  She has had some vomiting but she thinks it is due to her coughing so hard.  No recent steroids.  Last menstrual period is now.  {Add pertinent medical, surgical, social history, OB history to NWG:95621} The history is provided by the patient.  Shortness of Breath Severity:  Severe Onset quality:  Gradual Duration:  2 days Timing:  Constant Progression:  Unchanged Chronicity:  Recurrent Relieved by:  Nothing Worsened by:  Activity and coughing Ineffective treatments:  Inhaler Associated symptoms: chest pain, cough, fever, sore throat and wheezing   Associated symptoms: no abdominal pain, no hemoptysis and no sputum production        Prior to Admission medications   Not on File    Allergies: Penicillins    Review of Systems  Constitutional:  Positive for fever.  HENT:  Positive for sore throat.   Respiratory:  Positive for cough, shortness of breath and wheezing. Negative for hemoptysis and sputum production.   Cardiovascular:  Positive for chest pain.  Gastrointestinal:  Negative for abdominal pain.    Updated Vital Signs BP 131/82   Pulse (!) 136   Temp 98.6 F (37 C) (Oral)   Resp 18   Ht 5' 4 (1.626 m)   Wt 111.1 kg   LMP  (LMP Unknown)   SpO2 95%   BMI 42.05 kg/m   Physical Exam Vitals and nursing note reviewed.  Constitutional:      General: She is not  in acute distress.    Appearance: Normal appearance. She is well-developed.  HENT:     Head: Normocephalic and atraumatic.   Eyes:     Conjunctiva/sclera: Conjunctivae normal.    Cardiovascular:     Rate and Rhythm: Regular rhythm. Tachycardia present.     Heart sounds: No murmur heard. Pulmonary:     Effort: Tachypnea and accessory muscle usage present. No respiratory distress.     Breath sounds: No stridor. Wheezing and rhonchi present.  Abdominal:     Palpations: Abdomen is soft.     Tenderness: There is no abdominal tenderness. There is no guarding or rebound.   Musculoskeletal:        General: No tenderness or deformity. Normal range of motion.     Cervical back: Neck supple.     Right lower leg: No tenderness. No edema.     Left lower leg: No tenderness. No edema.   Skin:    General: Skin is warm and dry.   Neurological:     General: No focal deficit present.     Mental Status: She is alert.     GCS: GCS eye subscore is 4. GCS verbal subscore is 5. GCS motor subscore is 6.     (all labs ordered are listed, but only abnormal results are displayed) Labs Reviewed - No data to  display  EKG: None  Radiology: No results found.  {Document cardiac monitor, telemetry assessment procedure when appropriate:32947} Procedures   Medications Ordered in the ED  methylPREDNISolone  sodium succinate (SOLU-MEDROL ) 125 mg/2 mL injection 125 mg (has no administration in time range)  albuterol  (PROVENTIL ,VENTOLIN ) solution continuous neb (has no administration in time range)  magnesium  sulfate IVPB 2 g 50 mL (has no administration in time range)      {Click here for ABCD2, HEART and other calculators REFRESH Note before signing:1}                              Medical Decision Making Amount and/or Complexity of Data Reviewed Labs: ordered. Radiology: ordered.  Risk Prescription drug management.   This patient complains of ***; this involves an extensive number of  treatment Options and is a complaint that carries with it a high risk of complications and morbidity. The differential includes ***  I ordered, reviewed and interpreted labs, which included *** I ordered medication *** and reviewed PMP when indicated. I ordered imaging studies which included *** and I independently    visualized and interpreted imaging which showed *** Additional history obtained from *** Previous records obtained and reviewed *** I consulted *** and discussed lab and imaging findings and discussed disposition.  Cardiac monitoring reviewed, *** Social determinants considered, *** Critical Interventions: ***  After the interventions stated above, I reevaluated the patient and found *** Admission and further testing considered, ***   {Document critical care time when appropriate  Document review of labs and clinical decision tools ie CHADS2VASC2, etc  Document your independent review of radiology images and any outside records  Document your discussion with family members, caretakers and with consultants  Document social determinants of health affecting pt's care  Document your decision making why or why not admission, treatments were needed:32947:::1}   Final diagnoses:  None    ED Discharge Orders     None

## 2024-04-10 NOTE — ED Triage Notes (Signed)
 BIB [facility] from home, patient came in with shortness of breath that started yesterday with fatigue, fever, lightheadedness that has been going on for one week. Patient has a history of asthma    En route patient received   Duoneb x1 Albuterol  x1

## 2024-04-11 DIAGNOSIS — F411 Generalized anxiety disorder: Secondary | ICD-10-CM | POA: Diagnosis present

## 2024-04-11 DIAGNOSIS — K219 Gastro-esophageal reflux disease without esophagitis: Secondary | ICD-10-CM | POA: Diagnosis present

## 2024-04-11 DIAGNOSIS — E876 Hypokalemia: Secondary | ICD-10-CM | POA: Diagnosis present

## 2024-04-11 DIAGNOSIS — D649 Anemia, unspecified: Secondary | ICD-10-CM | POA: Diagnosis present

## 2024-04-11 DIAGNOSIS — G40909 Epilepsy, unspecified, not intractable, without status epilepticus: Secondary | ICD-10-CM | POA: Diagnosis present

## 2024-04-11 DIAGNOSIS — J9601 Acute respiratory failure with hypoxia: Secondary | ICD-10-CM

## 2024-04-11 DIAGNOSIS — R000 Tachycardia, unspecified: Secondary | ICD-10-CM | POA: Diagnosis present

## 2024-04-11 DIAGNOSIS — J45901 Unspecified asthma with (acute) exacerbation: Secondary | ICD-10-CM | POA: Diagnosis present

## 2024-04-11 DIAGNOSIS — J4541 Moderate persistent asthma with (acute) exacerbation: Secondary | ICD-10-CM

## 2024-04-11 DIAGNOSIS — Z87442 Personal history of urinary calculi: Secondary | ICD-10-CM | POA: Diagnosis not present

## 2024-04-11 DIAGNOSIS — F17210 Nicotine dependence, cigarettes, uncomplicated: Secondary | ICD-10-CM | POA: Diagnosis present

## 2024-04-11 DIAGNOSIS — R1110 Vomiting, unspecified: Secondary | ICD-10-CM | POA: Diagnosis present

## 2024-04-11 DIAGNOSIS — R0602 Shortness of breath: Secondary | ICD-10-CM | POA: Diagnosis present

## 2024-04-11 DIAGNOSIS — Z880 Allergy status to penicillin: Secondary | ICD-10-CM | POA: Diagnosis not present

## 2024-04-11 DIAGNOSIS — Z1152 Encounter for screening for COVID-19: Secondary | ICD-10-CM | POA: Diagnosis not present

## 2024-04-11 DIAGNOSIS — Z825 Family history of asthma and other chronic lower respiratory diseases: Secondary | ICD-10-CM | POA: Diagnosis not present

## 2024-04-11 DIAGNOSIS — J209 Acute bronchitis, unspecified: Secondary | ICD-10-CM | POA: Diagnosis present

## 2024-04-11 LAB — CBC
HCT: 42.4 % (ref 36.0–46.0)
Hemoglobin: 14 g/dL (ref 12.0–15.0)
MCH: 29.2 pg (ref 26.0–34.0)
MCHC: 33 g/dL (ref 30.0–36.0)
MCV: 88.3 fL (ref 80.0–100.0)
Platelets: 206 10*3/uL (ref 150–400)
RBC: 4.8 MIL/uL (ref 3.87–5.11)
RDW: 12.8 % (ref 11.5–15.5)
WBC: 8.7 10*3/uL (ref 4.0–10.5)
nRBC: 0 % (ref 0.0–0.2)

## 2024-04-11 LAB — BASIC METABOLIC PANEL WITH GFR
Anion gap: 12 (ref 5–15)
BUN: 11 mg/dL (ref 6–20)
CO2: 20 mmol/L — ABNORMAL LOW (ref 22–32)
Calcium: 9.3 mg/dL (ref 8.9–10.3)
Chloride: 106 mmol/L (ref 98–111)
Creatinine, Ser: 0.75 mg/dL (ref 0.44–1.00)
GFR, Estimated: >60 mL/min (ref >60)
Glucose, Bld: 221 mg/dL — ABNORMAL HIGH (ref 70–99)
Potassium: 3.8 mmol/L (ref 3.5–5.1)
Sodium: 138 mmol/L (ref 135–145)

## 2024-04-11 LAB — RESPIRATORY PANEL BY PCR

## 2024-04-11 MED ORDER — NICOTINE 21 MG/24HR TD PT24
21.0000 mg | MEDICATED_PATCH | Freq: Every day | TRANSDERMAL | Status: AC
  Administered 2024-04-12: 21 mg via TRANSDERMAL
  Filled 2024-04-11 (×2): qty 1

## 2024-04-11 MED ORDER — SODIUM CHLORIDE 0.9 % IV SOLN: 500.0000 mg | INTRAVENOUS | Status: DC

## 2024-04-11 MED ORDER — NICOTINE 21 MG/24HR TD PT24: 21.0000 mg | MEDICATED_PATCH | Freq: Every day | TRANSDERMAL | Status: DC

## 2024-04-11 MED ORDER — AZITHROMYCIN 500 MG PO TABS
500.0000 mg | ORAL_TABLET | Freq: Every day | ORAL | Status: AC
  Administered 2024-04-11 – 2024-04-12 (×2): 500 mg via ORAL
  Filled 2024-04-11 (×2): qty 1

## 2024-04-11 MED ORDER — IPRATROPIUM-ALBUTEROL 0.5-2.5 (3) MG/3ML IN SOLN
3.0000 mL | Freq: Four times a day (QID) | RESPIRATORY_TRACT | Status: DC
  Administered 2024-04-11 – 2024-04-13 (×10): 3 mL via RESPIRATORY_TRACT
  Filled 2024-04-11 (×9): qty 3

## 2024-04-11 MED ORDER — SODIUM CHLORIDE 0.9 % IV SOLN
500.0000 mg | Freq: Once | INTRAVENOUS | Status: AC
  Administered 2024-04-11: 500 mg via INTRAVENOUS
  Filled 2024-04-11: qty 5

## 2024-04-11 MED ORDER — IPRATROPIUM-ALBUTEROL 0.5-2.5 (3) MG/3ML IN SOLN: 3.0000 mL | Freq: Four times a day (QID) | RESPIRATORY_TRACT | Status: DC | PRN

## 2024-04-11 NOTE — Plan of Care (Signed)
  Problem: Activity: Goal: Ability to perform activities at highest level will improve Outcome: Progressing   Problem: Respiratory: Goal: Respiratory status will improve Outcome: Progressing Goal: Will regain and/or maintain adequate ventilation Outcome: Progressing Goal: Diagnostic test results will improve Outcome: Progressing Goal: Identification of resources available to assist in meeting health care needs will improve Outcome: Progressing

## 2024-04-11 NOTE — H&P (Incomplete)
 History and Physical    Mercedes [name] ZOX:[id number] DOB: [birth year] DOA: 04/10/2024  PCP: Pcp, No   Patient coming from: Home   Chief Complaint:  Chief Complaint  Patient presents with  . Shortness of Breath   ED TRIAGE note:BIB [facility] from home, patient came in with shortness of breath that started yesterday with fatigue, fever, lightheadedness that has been going on for one week. Patient has a history of asthma     En route patient received   Duoneb x1 Albuterol  x1  HPI:  Mercedes [name] is a 33 y.o. female with medical history significant of asthma,  generalized anxiety disorder patient presented to emergency department complaining of shortness of breath with associated fever, lightheadedness, fatigue which is ongoing for 1 week.  In the office the patient has been given DuoNeb and albuterol  x 1.   ED Course:  At presentation to ED patient found tachycardic heart rate 135, tachypneic 24, O2 sat 89% room air.  Blood pressure within good range.  EKG showed sinus tachycardia heart rate 127. Chest x-ray no active disease process.  Respiratory panel negative for COVID/RSV flu. BMP showing low potassium 3 low bicarb 17 elevated anion gap 17. CBC showing leukocytosis 13.4. Pregnancy test negative. Normal BNP 3.4.   In the ED patient has been given albuterol  and methylprednisolone .  Hospitalist has been consulted for further management of acute exacerbation.   Significant labs in the ED: Lab Orders         Resp panel by RT-PCR (RSV, Flu A&B, Covid) Anterior Nasal Swab         Culture, blood (routine x 2)         Basic metabolic panel         CBC with Differential/Platelet         hCG, serum, qualitative         Brain natriuretic peptide         CBC         Basic metabolic panel       Review of Systems:  ROS  Past Medical History:  Diagnosis Date  . Anemia   . Asthma   . Hypokalemia   . Kidney stone 2018  . Seizures (HCC)     Past Surgical  History:  Procedure Laterality Date  . NO PAST SURGERIES    . WISDOM TOOTH EXTRACTION       reports that she quit smoking about 4 years ago. Her smoking use included cigarettes. She has never used smokeless tobacco. She reports current drug use. Drug: Marijuana. She reports that she does not drink alcohol.  Allergies  Allergen Reactions  . Penicillins Other (See Comments)    Reaction:  Unknown  Has patient had a PCN reaction causing immediate rash, facial/tongue/throat swelling, SOB or lightheadedness with hypotension: Unknown Has patient had a PCN reaction causing severe rash involving mucus membranes or skin necrosis: Unknown Has patient had a PCN reaction that required hospitalization: Unknown Has patient had a PCN reaction occurring within the last 10 years: No If all of the above answers are NO, then may proceed with Cephalosporin use. Unknown reaction Childhood allergy    Family History  Problem Relation Age of Onset  . Cancer Mother        ovarian  . Asthma Mother     Prior to Admission medications   Not on File     Physical Exam: Vitals:   04/10/24 2100 04/10/24 2130 04/10/24 2200 04/10/24 2300  BP: 114/75   120/69  Pulse: (!) 124   (!) 117  Resp:  20    Temp:      TempSrc:      SpO2: 95%  (!) 89% 91%  Weight:      Height:        Physical Exam   Labs on Admission: I have personally reviewed following labs and imaging studies  CBC: Recent Labs  Lab 04/10/24 1955  WBC 13.4*  NEUTROABS 10.5*  HGB 14.9  HCT 45.6  MCV 89.2  PLT 221   Basic Metabolic Panel: Recent Labs  Lab 04/10/24 1955  NA 138  K 3.0*  CL 104  CO2 17*  GLUCOSE 149*  BUN 11  CREATININE 0.92  CALCIUM 9.3   GFR: Estimated Creatinine Clearance: 107.1 mL/min (by C-G formula based on SCr of 0.92 mg/dL). Liver Function Tests: No results for input(s): AST, ALT, ALKPHOS, BILITOT, PROT, ALBUMIN in the last 168 hours. No results for input(s): LIPASE, AMYLASE in  the last 168 hours. No results for input(s): AMMONIA in the last 168 hours. Coagulation Profile: No results for input(s): INR, PROTIME in the last 168 hours. Cardiac Enzymes: No results for input(s): CKTOTAL, CKMB, CKMBINDEX, TROPONINI, TROPONINIHS in the last 168 hours. BNP (last 3 results) Recent Labs    04/10/24 1955  BNP 3.4   HbA1C: No results for input(s): HGBA1C in the last 72 hours. CBG: No results for input(s): GLUCAP in the last 168 hours. Lipid Profile: No results for input(s): CHOL, HDL, LDLCALC, TRIG, CHOLHDL, LDLDIRECT in the last 72 hours. Thyroid Function Tests: No results for input(s): TSH, T4TOTAL, FREET4, T3FREE, THYROIDAB in the last 72 hours. Anemia Panel: No results for input(s): VITAMINB12, FOLATE, FERRITIN, TIBC, IRON , RETICCTPCT in the last 72 hours. Urine analysis:    Component Value Date/Time   COLORURINE AMBER (A) 06/24/2023 1745   APPEARANCEUR HAZY (A) 06/24/2023 1745   LABSPEC 1.026 06/24/2023 1745   PHURINE 5.0 06/24/2023 1745   GLUCOSEU NEGATIVE 06/24/2023 1745   HGBUR NEGATIVE 06/24/2023 1745   BILIRUBINUR SMALL (A) 06/24/2023 1745   KETONESUR 5 (A) 06/24/2023 1745   PROTEINUR 30 (A) 06/24/2023 1745   UROBILINOGEN 0.2 11/23/2017 1541   NITRITE NEGATIVE 06/24/2023 1745   LEUKOCYTESUR SMALL (A) 06/24/2023 1745    Radiological Exams on Admission: I have personally reviewed images DG Chest Port 1 View Result Date: 04/10/2024 CLINICAL DATA:  Short of breath EXAM: PORTABLE CHEST 1 VIEW COMPARISON:  06/24/2023 FINDINGS: The heart size and mediastinal contours are within normal limits. Both lungs are clear. The visualized skeletal structures are unremarkable. IMPRESSION: No active disease. Electronically Signed   By: Bobbye Burrow M.D.   On: 04/10/2024 19:08     EKG: My personal interpretation of EKG shows: Sinus tachycardia heart rate 127, nonspecific ST of abnormality, normal  QTc.    Assessment/Plan: Principal Problem:   Acute asthma exacerbation Active Problems:   Asthma, chronic obstructive, with acute exacerbation (HCC)   Acute bronchitis   Hypokalemia   GAD (generalized anxiety disorder)   Sinus tachycardia   Continuous dependence on cigarette smoking    Assessment and Plan: Asthma acute exacerbation Acute bronchitis History of chronic smoking cigarette and concern for underlying COPD exacerbation as well (no PFT in the past) -Patient present emergency department complaining of shortness of breath, cough which has been progressively getting worse over the course of last 1 week.  Patient reported fever and brought up cough few days ago now developing shortness of  breath.  At home patient is not any longer short acting bronchodilator.  At this addition to ED O2 sat dropped to 89% room air, tachycardic, tachypneic, blood pressure within good range. - And route to ED via EMS patient has been given DuoNeb and albuterol  nebulizer. -In the ED patient has been given albuterol  nebulizer and IV methylprednisolone  and mag sulfate 2 g. Patient reported history of smoking for 10 years.  She smokes 1 and half pack cigarettes per day. -Concern for both asthma exacerbation and underlying COPD exacerbation as well in the setting of chronic smoking cigarette - Continue DuoNeb every 6 hours scheduled and every 4 hour as needed for wheezing shortness of breath.  Continue IV Solu-Medrol  40 mg twice daily.  Starting IV azithromycin , continue Mucinex and supportive care. Continue supplemental oxygen and wean down to room air as patient tolerates.   Hypokalemia Low potassium 3.  Replating with oral KCl 40 mEq.  Sinus tachycardia -Versus sinus tachycardia in the setting of asthma exacerbation.  Continue IV metoprolol as needed.   Chronic smoking cigarette. - Patient reported she has been smoking cigarette 1 and half pack daily almost for 10 years.  Counseled patient at  the bedside for smoking cessation.  Continue nicotine patch.   DVT prophylaxis:  Lovenox  Code Status:  *** Diet: Regular diet Family Communication:  *** Family was present at bedside, at the time of interview.  Opportunity was given to ask question and all questions were answered satisfactorily.  Disposition Plan:  ***  Consults:  ***  Admission status:   Observation, Telemetry bed  Severity of Illness: The appropriate patient status for this patient is OBSERVATION. Observation status is judged to be reasonable and necessary in order to provide the required intensity of service to ensure the patient's safety. The patient's presenting symptoms, physical exam findings, and initial radiographic and laboratory data in the context of their medical condition is felt to place them at decreased risk for further clinical deterioration. Furthermore, it is anticipated that the patient will be medically stable for discharge from the hospital within 2 midnights of admission.     Shomari Matusik, MD [facility]  How to contact the [facility] Attending or Consulting provider 7A - 7P or covering provider during after hours 7P -7A, for this patient.  Check the care team in [facility] and look for a) attending/consulting [facility] provider listed and b) the [facility] team listed Log into [email or web address] and use [facility] universal password to access. If you do not have the password, please contact the hospital operator. Locate the [facility] provider you are looking for under [facility] and page to a number that you can be directly reached. If you still have difficulty reaching the provider, please page the [facility] (Director on Call) for the Hospitalists listed on amion for assistance.  04/11/2024, 12:02 AM

## 2024-04-11 NOTE — Progress Notes (Signed)
 [facility] PROGRESS NOTE    Progress Note  Jacynda Brunke  ZOX:[id number] DOB: [birth year] DOA: 04/10/2024 PCP: Pcp, No     Brief Narrative:   Tennelle Thedora Rings is an 33 y.o. female past medical history of asthma comes in for shortness of breath fever and lightheadedness and ongoing cough for a week despite DuoNebs.  Assessment/Plan:   Acute respiratory failure with hypoxia secondary to acute asthma exacerbation: She is not on oxygen at home, here she is requiring 3 L of oxygen to keep saturations greater 90% In the ED she was given albuterol  steroids and mag sulfate with minimal improvement. She relates she continues to smoke. Continue high-dose steroids scheduled albuterol  and as needed. She will start empirically on IV azithromycin . Leukocytosis has resolved. Culture data has remained negative.  Hypokalemia Repleted now resolved.  Sinus tachycardia: Likely due to respiratory failure. Physiologic,  continue to monitor.  GAD (generalized anxiety disorder) Noted continue current home meds.  Continuous dependence on cigarette smoking Counseling.   DVT prophylaxis: lovenox  Family Communication:none Status is: Observation The patient will require care spanning > 2 midnights and should be moved to inpatient because: Acute respiratory failure with hypoxia    Code Status:     Code Status Orders  (From admission, onward)           Start     Ordered   04/10/24 2311  Full code  Continuous       Question:  By:  Answer:  Consent: discussion documented in EHR   04/10/24 2311           Code Status History     Date Active Date Inactive Code Status Order ID Comments User Context   06/24/2023 2306 06/28/2023 1832 Full Code [id number]  Jackolyn Masker, MD ED   06/16/2023 1816 06/21/2023 1907 Full Code [id number]  Jonelle Neri, DO ED   12/11/2021 0556 12/14/2021 1617 Full Code [id number]  Angelene Kelly, MD ED   08/06/2017 1649 08/08/2017 1805 Full Code  [id number]  Hadassah Letters, CNM Inpatient   08/06/2017 0902 08/06/2017 1649 Full Code [id number]  Millard Alliance, PA-C Inpatient   02/27/2016 0826 02/29/2016 1635 Full Code [id number]  Rosi Converse, PA-C Inpatient         IV Access:   Peripheral IV   Procedures and diagnostic studies:   DG Chest Port 1 View Result Date: 04/10/2024 CLINICAL DATA:  Short of breath EXAM: PORTABLE CHEST 1 VIEW COMPARISON:  06/24/2023 FINDINGS: The heart size and mediastinal contours are within normal limits. Both lungs are clear. The visualized skeletal structures are unremarkable. IMPRESSION: No active disease. Electronically Signed   By: Bobbye Burrow M.D.   On: 04/10/2024 19:08     Medical Consultants:   None.   Subjective:    Marcy Leopoldo Rancher she relates her breathing is about the same  Objective:    Vitals:   04/11/24 0300 04/11/24 0400 04/11/24 0418 04/11/24 0600  BP: 120/74   (!) 114/50  Pulse: 99 91 94 (!) 102  Resp: (!) 31 (!) 29 (!) 31 (!) 34  Temp:      TempSrc:      SpO2: 93% (!) 89% 93% 90%  Weight:      Height:       SpO2: 90 % O2 Flow Rate (L/min): 3 L/min   Intake/Output Summary (Last 24 hours) at 04/11/2024 0611 Last data filed at 04/11/2024 0346 Gross per 24 hour  Intake 251.29 ml  Output --  Net 251.29 ml   Filed Weights   04/10/24 1801  Weight: 111.1 kg    Exam: General exam: In no acute distress. Respiratory system: Good air movement and wheezing and rhonchi bilaterally Cardiovascular system: S1 & S2 heard, RRR. No JVD.  Gastrointestinal system: Abdomen is nondistended, soft and nontender.  Extremities: No pedal edema. Skin: No rashes, lesions or ulcers Psychiatry: Judgement and insight appear normal. Mood & affect appropriate.    Data Reviewed:    Labs: Basic Metabolic Panel: Recent Labs  Lab 04/10/24 1955 04/11/24 0412  NA 138 138  K 3.0* 3.8  CL 104 106  CO2 17* 20*  GLUCOSE 149* 221*  BUN 11 11  CREATININE 0.92 0.75  CALCIUM  9.3 9.3   GFR Estimated Creatinine Clearance: 123.2 mL/min (by C-G formula based on SCr of 0.75 mg/dL). Liver Function Tests: No results for input(s): AST, ALT, ALKPHOS, BILITOT, PROT, ALBUMIN in the last 168 hours. No results for input(s): LIPASE, AMYLASE in the last 168 hours. No results for input(s): AMMONIA in the last 168 hours. Coagulation profile No results for input(s): INR, PROTIME in the last 168 hours. COVID-19 Labs  No results for input(s): DDIMER, FERRITIN, LDH, CRP in the last 72 hours.  Lab Results  Component Value Date   SARSCOV2NAA NEGATIVE 04/10/2024   SARSCOV2NAA NEGATIVE 12/09/2021    CBC: Recent Labs  Lab 04/10/24 1955 04/11/24 0412  WBC 13.4* 8.7  NEUTROABS 10.5*  --   HGB 14.9 14.0  HCT 45.6 42.4  MCV 89.2 88.3  PLT 221 206   Cardiac Enzymes: No results for input(s): CKTOTAL, CKMB, CKMBINDEX, TROPONINI in the last 168 hours. BNP (last 3 results) No results for input(s): PROBNP in the last 8760 hours. CBG: No results for input(s): GLUCAP in the last 168 hours. D-Dimer: No results for input(s): DDIMER in the last 72 hours. Hgb A1c: No results for input(s): HGBA1C in the last 72 hours. Lipid Profile: No results for input(s): CHOL, HDL, LDLCALC, TRIG, CHOLHDL, LDLDIRECT in the last 72 hours. Thyroid function studies: No results for input(s): TSH, T4TOTAL, T3FREE, THYROIDAB in the last 72 hours.  Invalid input(s): FREET3 Anemia work up: No results for input(s): VITAMINB12, FOLATE, FERRITIN, TIBC, IRON , RETICCTPCT in the last 72 hours. Sepsis Labs: Recent Labs  Lab 04/10/24 1955 04/11/24 0412  WBC 13.4* 8.7   Microbiology Recent Results (from the past 240 hours)  Resp panel by RT-PCR (RSV, Flu A&B, Covid) Anterior Nasal Swab     Status: None   Collection Time: 04/10/24  6:20 PM   Specimen: Anterior Nasal Swab  Result Value Ref Range Status   SARS Coronavirus  2 by RT PCR NEGATIVE NEGATIVE Final   Influenza A by PCR NEGATIVE NEGATIVE Final   Influenza B by PCR NEGATIVE NEGATIVE Final    Comment: (NOTE) The Xpert Xpress SARS-CoV-2/FLU/RSV plus assay is intended as an aid in the diagnosis of influenza from Nasopharyngeal swab specimens and should not be used as a sole basis for treatment. Nasal washings and aspirates are unacceptable for Xpert Xpress SARS-CoV-2/FLU/RSV testing.  Fact Sheet for Patients: BloggerCourse.com  Fact Sheet for Healthcare Providers: SeriousBroker.it  This test is not yet approved or cleared by the [place]  FDA and has been authorized for detection and/or diagnosis of SARS-CoV-2 by FDA under an Emergency Use Authorization (EUA). This EUA will remain in effect (meaning this test can be used) for the duration of the COVID-19 declaration under Section 564(b)(1) of the Act, 21 U.S.C. section 360bbb-3(b)(1),  unless the authorization is terminated or revoked.     Resp Syncytial Virus by PCR NEGATIVE NEGATIVE Final    Comment: (NOTE) Fact Sheet for Patients: BloggerCourse.com  Fact Sheet for Healthcare Providers: SeriousBroker.it  This test is not yet approved or cleared by the [place]  FDA and has been authorized for detection and/or diagnosis of SARS-CoV-2 by FDA under an Emergency Use Authorization (EUA). This EUA will remain in effect (meaning this test can be used) for the duration of the COVID-19 declaration under Section 564(b)(1) of the Act, 21 U.S.C. section 360bbb-3(b)(1), unless the authorization is terminated or revoked.  Performed at [facility], [address] [place], [place] [address]      Medications:    enoxaparin  (LOVENOX ) injection  40 mg Subcutaneous Q24H   guaiFENesin  600 mg Oral BID   ipratropium-albuterol   3 mL Nebulization Q6H   methylPREDNISolone  (SOLU-MEDROL )  injection  40 mg Intravenous Q12H   nicotine  21 mg Transdermal Daily   pantoprazole   40 mg Oral Daily   sodium chloride  flush  3 mL Intravenous Q12H   Continuous Infusions:  sodium chloride      [START ON 04/12/2024] azithromycin         LOS: 0 days   Macdonald Savoy  [facility]  04/11/2024, 6:11 AM

## 2024-04-11 NOTE — TOC CM/SW Note (Signed)
 Transition of Care [facility]) - Inpatient Brief Assessment   Patient Details  Name: Mercedes [name] MRN: [id number] Date of Birth: [birth year]  [facility] CM/SW Contact:    Tom-Johnson, Angelique Ken, RN Phone Number: 04/11/2024, 3:35 PM   Clinical Narrative:  Patient presented to the ED with Shortness of Breath, Fatigue, Fever, Lightheadedness. Admitted with Asthma Exacerbation. Patient was started on high-dose Steroids, Neb tx, inhalers and IV abx.   From home with her Fiance and three children. Both parents and three siblings supportive. Not employed, not o disability. Does not drive, uses public transportation. Does not have a PCP, hosp f/u and new patient establishment scheduled, info on AVS. Uses [facility] on [address]   Patient not Medically ready for discharge.  CM will continue to follow as patient progresses with care towards discharge.              Transition of Care Asessment: Insurance and Status: Insurance coverage has been reviewed Patient has primary care physician: No (Hosp f/u scheduled.) Home environment has been reviewed: Yes Prior level of function:: Independent Prior/Current Home Services: No current home services Social Drivers of Health Review: SDOH reviewed no interventions necessary Readmission risk has been reviewed: Yes Transition of care needs: transition of care needs identified, TOC will continue to follow

## 2024-04-11 NOTE — Progress Notes (Signed)
 Ok, add stop date of azithromycin  for 3d per Dr Versa Gore.  Ivery Marking, PharmD, BCIDP, AAHIVP, CPP Infectious Disease Pharmacist 04/11/2024 10:40 AM

## 2024-04-12 DIAGNOSIS — J9601 Acute respiratory failure with hypoxia: Secondary | ICD-10-CM | POA: Diagnosis not present

## 2024-04-12 DIAGNOSIS — J4541 Moderate persistent asthma with (acute) exacerbation: Secondary | ICD-10-CM | POA: Diagnosis not present

## 2024-04-12 NOTE — Plan of Care (Signed)
  Problem: Activity: Goal: Ability to perform activities at highest level will improve Outcome: Progressing   Problem: Respiratory: Goal: Respiratory status will improve Outcome: Progressing Goal: Will regain and/or maintain adequate ventilation Outcome: Progressing Goal: Diagnostic test results will improve Outcome: Progressing Goal: Identification of resources available to assist in meeting health care needs will improve Outcome: Progressing   Problem: Education: Goal: Knowledge of General Education information will improve Description: Including pain rating scale, medication(s)/side effects and non-pharmacologic comfort measures Outcome: Progressing   Problem: Health Behavior/Discharge Planning: Goal: Ability to manage health-related needs will improve Outcome: Progressing   Problem: Clinical Measurements: Goal: Ability to maintain clinical measurements within normal limits will improve Outcome: Progressing Goal: Will remain free from infection Outcome: Progressing Goal: Diagnostic test results will improve Outcome: Progressing Goal: Respiratory complications will improve Outcome: Progressing Goal: Cardiovascular complication will be avoided Outcome: Progressing   Problem: Activity: Goal: Risk for activity intolerance will decrease Outcome: Progressing   Problem: Nutrition: Goal: Adequate nutrition will be maintained Outcome: Progressing   Problem: Coping: Goal: Level of anxiety will decrease Outcome: Progressing   Problem: Elimination: Goal: Will not experience complications related to bowel motility Outcome: Progressing Goal: Will not experience complications related to urinary retention Outcome: Progressing   Problem: Pain Managment: Goal: General experience of comfort will improve and/or be controlled Outcome: Progressing   Problem: Safety: Goal: Ability to remain free from injury will improve Outcome: Progressing   Problem: Skin Integrity: Goal:  Risk for impaired skin integrity will decrease Outcome: Progressing

## 2024-04-12 NOTE — Progress Notes (Signed)
 [facility] PROGRESS NOTE    Progress Note  Mercedes [name]  FAO:[id number] DOB: 1990/12/19 DOA: 04/10/2024 PCP: Pcp, No     Brief Narrative:   Mercedes [name] is an 33 y.o. female past medical history of asthma comes in for shortness of breath fever and lightheadedness and ongoing cough for a week despite DuoNebs.  Assessment/Plan:   Acute respiratory failure with hypoxia secondary to acute asthma exacerbation: Still requiring 4 L oxygen to keep saturations greater than 90%. Relates her breathing is slightly improved but is short of breath is going to the bathroom. She relates she continues to smoke. Continue high-dose steroids scheduled albuterol  and as needed. Cont on  IV azithromycin . Leukocytosis has resolved. Culture data has remained negative.  Hypokalemia Repleted now resolved.  Sinus tachycardia: Now resolved.  GAD (generalized anxiety disorder) Noted continue current home meds.  Continuous dependence on cigarette smoking Counseling.   DVT prophylaxis: lovenox  Family Communication:none Status is: Observation The patient will require care spanning > 2 midnights and should be moved to inpatient because: Acute respiratory failure with hypoxia    Code Status:     Code Status Orders  (From admission, onward)           Start     Ordered   04/10/24 2311  Full code  Continuous       Question:  By:  Answer:  Consent: discussion documented in EHR   04/10/24 2311           Code Status History     Date Active Date Inactive Code Status Order ID Comments User Context   06/24/2023 2306 06/28/2023 1832 Full Code [id number]  Jackolyn Masker, MD ED   06/16/2023 1816 06/21/2023 1907 Full Code [id number]  Jonelle Neri, DO ED   12/11/2021 0556 12/14/2021 1617 Full Code [id number]  Angelene Kelly, MD ED   08/06/2017 1649 08/08/2017 1805 Full Code [id number]  Hadassah Letters, CNM Inpatient   08/06/2017 0902 08/06/2017 1649 Full Code [id number]  Millard Alliance, PA-C Inpatient   02/27/2016 0826 02/29/2016 1635 Full Code [id number]  Rosi Converse, PA-C Inpatient         IV Access:   Peripheral IV   Procedures and diagnostic studies:   DG Chest Port 1 View Result Date: 04/10/2024 CLINICAL DATA:  Short of breath EXAM: PORTABLE CHEST 1 VIEW COMPARISON:  06/24/2023 FINDINGS: The heart size and mediastinal contours are within normal limits. Both lungs are clear. The visualized skeletal structures are unremarkable. IMPRESSION: No active disease. Electronically Signed   By: Bobbye Burrow M.D.   On: 04/10/2024 19:08     Medical Consultants:   None.   Subjective:    Mercedes [name] breathing slightly improved still short of breath going to the bathroom.  Objective:    Vitals:   04/12/24 0115 04/12/24 0521 04/12/24 0745 04/12/24 0749  BP:  105/60 113/71   Pulse: 73 81 87   Resp: 18 18 (!) 24   Temp:  98.1 F (36.7 C) 98.2 F (36.8 C)   TempSrc:  Oral Oral   SpO2: 92% 96% (!) 88% (!) 89%  Weight:      Height:       SpO2: (!) 89 % O2 Flow Rate (L/min): 4 L/min (increased due to SpO2 88-90%) FiO2 (%): 28 %   Intake/Output Summary (Last 24 hours) at 04/12/2024 0811 Last data filed at 04/12/2024 0600 Gross per 24 hour  Intake 1680 ml  Output 0 ml  Net 1680 ml   Filed Weights   04/10/24 1801  Weight: 111.1 kg    Exam: General exam: In no acute distress. Respiratory system: Good air movement and wheezing bilaterally Cardiovascular system: S1 & S2 heard, RRR. No JVD. Gastrointestinal system: Abdomen is nondistended, soft and nontender.  Extremities: No pedal edema. Skin: No rashes, lesions or ulcers Psychiatry: Judgement and insight appear normal. Mood & affect appropriate.  Data Reviewed:    Labs: Basic Metabolic Panel: Recent Labs  Lab 04/10/24 1955 04/11/24 0412  NA 138 138  K 3.0* 3.8  CL 104 106  CO2 17* 20*  GLUCOSE 149* 221*  BUN 11 11  CREATININE 0.92 0.75  CALCIUM 9.3 9.3    GFR Estimated Creatinine Clearance: 123.2 mL/min (by C-G formula based on SCr of 0.75 mg/dL). Liver Function Tests: No results for input(s): AST, ALT, ALKPHOS, BILITOT, PROT, ALBUMIN in the last 168 hours. No results for input(s): LIPASE, AMYLASE in the last 168 hours. No results for input(s): AMMONIA in the last 168 hours. Coagulation profile No results for input(s): INR, PROTIME in the last 168 hours. COVID-19 Labs  No results for input(s): DDIMER, FERRITIN, LDH, CRP in the last 72 hours.  Lab Results  Component Value Date   SARSCOV2NAA NEGATIVE 04/10/2024   SARSCOV2NAA NEGATIVE 12/09/2021    CBC: Recent Labs  Lab 04/10/24 1955 04/11/24 0412  WBC 13.4* 8.7  NEUTROABS 10.5*  --   HGB 14.9 14.0  HCT 45.6 42.4  MCV 89.2 88.3  PLT 221 206   Cardiac Enzymes: No results for input(s): CKTOTAL, CKMB, CKMBINDEX, TROPONINI in the last 168 hours. BNP (last 3 results) No results for input(s): PROBNP in the last 8760 hours. CBG: No results for input(s): GLUCAP in the last 168 hours. D-Dimer: No results for input(s): DDIMER in the last 72 hours. Hgb A1c: No results for input(s): HGBA1C in the last 72 hours. Lipid Profile: No results for input(s): CHOL, HDL, LDLCALC, TRIG, CHOLHDL, LDLDIRECT in the last 72 hours. Thyroid function studies: No results for input(s): TSH, T4TOTAL, T3FREE, THYROIDAB in the last 72 hours.  Invalid input(s): FREET3 Anemia work up: No results for input(s): VITAMINB12, FOLATE, FERRITIN, TIBC, IRON , RETICCTPCT in the last 72 hours. Sepsis Labs: Recent Labs  Lab 04/10/24 1955 04/11/24 0412  WBC 13.4* 8.7   Microbiology Recent Results (from the past 240 hours)  Resp panel by RT-PCR (RSV, Flu A&B, Covid) Anterior Nasal Swab     Status: None   Collection Time: 04/10/24  6:20 PM   Specimen: Anterior Nasal Swab  Result Value Ref Range Status   SARS Coronavirus 2 by RT  PCR NEGATIVE NEGATIVE Final   Influenza A by PCR NEGATIVE NEGATIVE Final   Influenza B by PCR NEGATIVE NEGATIVE Final    Comment: (NOTE) The Xpert Xpress SARS-CoV-2/FLU/RSV plus assay is intended as an aid in the diagnosis of influenza from Nasopharyngeal swab specimens and should not be used as a sole basis for treatment. Nasal washings and aspirates are unacceptable for Xpert Xpress SARS-CoV-2/FLU/RSV testing.  Fact Sheet for Patients: BloggerCourse.com  Fact Sheet for Healthcare Providers: SeriousBroker.it  This test is not yet approved or cleared by the [place]  FDA and has been authorized for detection and/or diagnosis of SARS-CoV-2 by FDA under an Emergency Use Authorization (EUA). This EUA will remain in effect (meaning this test can be used) for the duration of the COVID-19 declaration under Section 564(b)(1) of the Act, 21 U.S.C. section 360bbb-3(b)(1), unless the authorization is terminated  or revoked.     Resp Syncytial Virus by PCR NEGATIVE NEGATIVE Final    Comment: (NOTE) Fact Sheet for Patients: BloggerCourse.com  Fact Sheet for Healthcare Providers: SeriousBroker.it  This test is not yet approved or cleared by the [place]  FDA and has been authorized for detection and/or diagnosis of SARS-CoV-2 by FDA under an Emergency Use Authorization (EUA). This EUA will remain in effect (meaning this test can be used) for the duration of the COVID-19 declaration under Section 564(b)(1) of the Act, 21 U.S.C. section 360bbb-3(b)(1), unless the authorization is terminated or revoked.  Performed at [facility], [address], [place], [place] [address]   Culture, blood (routine x 2)     Status: None (Preliminary result)   Collection Time: 04/10/24  7:55 PM   Specimen: BLOOD  Result Value Ref Range Status   Specimen Description BLOOD RIGHT ANTECUBITAL   Final   Special Requests   Final    BOTTLES DRAWN AEROBIC AND ANAEROBIC Blood Culture adequate volume   Culture   Final    NO GROWTH < 12 HOURS Performed at [facility], [address], [place], [place] [address]    Report Status PENDING  Incomplete  Culture, blood (routine x 2)     Status: None (Preliminary result)   Collection Time: 04/10/24  7:55 PM   Specimen: BLOOD LEFT FOREARM  Result Value Ref Range Status   Specimen Description BLOOD LEFT FOREARM  Final   Special Requests   Final    BOTTLES DRAWN AEROBIC AND ANAEROBIC Blood Culture adequate volume   Culture   Final    NO GROWTH < 12 HOURS Performed at [facility], [address] [place], [place] [address]    Report Status PENDING  Incomplete  Respiratory (~20 pathogens) panel by PCR     Status: Abnormal   Collection Time: 04/11/24  6:25 AM   Specimen: Nasopharyngeal Swab; Respiratory  Result Value Ref Range Status   Adenovirus NOT DETECTED NOT DETECTED Final   Coronavirus 229E NOT DETECTED NOT DETECTED Final    Comment: (NOTE) The Coronavirus on the Respiratory Panel, DOES NOT test for the novel  Coronavirus (2019 nCoV)    Coronavirus HKU1 NOT DETECTED NOT DETECTED Final   Coronavirus NL63 NOT DETECTED NOT DETECTED Final   Coronavirus OC43 NOT DETECTED NOT DETECTED Final   Metapneumovirus NOT DETECTED NOT DETECTED Final   Rhinovirus / Enterovirus DETECTED (A) NOT DETECTED Final   Influenza A NOT DETECTED NOT DETECTED Final   Influenza B NOT DETECTED NOT DETECTED Final   Parainfluenza Virus 1 NOT DETECTED NOT DETECTED Final   Parainfluenza Virus 2 NOT DETECTED NOT DETECTED Final   Parainfluenza Virus 3 NOT DETECTED NOT DETECTED Final   Parainfluenza Virus 4 NOT DETECTED NOT DETECTED Final   Respiratory Syncytial Virus NOT DETECTED NOT DETECTED Final   Bordetella pertussis NOT DETECTED NOT DETECTED Final   Bordetella Parapertussis NOT DETECTED NOT DETECTED Final   Chlamydophila pneumoniae NOT DETECTED  NOT DETECTED Final   Mycoplasma pneumoniae NOT DETECTED NOT DETECTED Final    Comment: Performed at [facility], [address] [place], [place] [address]     Medications:    azithromycin   500 mg Oral Q2000   enoxaparin  (LOVENOX ) injection  40 mg Subcutaneous Q24H   guaiFENesin  600 mg Oral BID   ipratropium-albuterol   3 mL Nebulization Q6H   methylPREDNISolone  (SOLU-MEDROL ) injection  40 mg Intravenous Q12H   nicotine  21 mg Transdermal Daily   pantoprazole   40 mg Oral Daily   sodium chloride  flush  3 mL Intravenous Q12H   Continuous Infusions:      LOS: 1 day   Macdonald Savoy  [facility]  04/12/2024, 8:11 AM

## 2024-04-13 DIAGNOSIS — J4541 Moderate persistent asthma with (acute) exacerbation: Secondary | ICD-10-CM | POA: Diagnosis not present

## 2024-04-13 DIAGNOSIS — J9601 Acute respiratory failure with hypoxia: Secondary | ICD-10-CM | POA: Diagnosis not present

## 2024-04-13 NOTE — Progress Notes (Signed)
RT instructed patient on the use of a flutter valve. Patient is able to demonstrate back good technique.  

## 2024-04-13 NOTE — Progress Notes (Signed)
 [facility] PROGRESS NOTE    Progress Note  Jeryn Bertoni  FMW:[id number] DOB: 1991-08-21 DOA: 04/10/2024 PCP: Pcp, No     Brief Narrative:   Kamaiyah Zephyra Bernardi is an 33 y.o. female past medical history of asthma comes in for shortness of breath fever and lightheadedness and ongoing cough for a week despite DuoNebs.  Assessment/Plan:   Acute respiratory failure with hypoxia secondary to acute asthma exacerbation: Still requiring 2 L oxygen to keep saturations greater than 90%. Relates her breathing is improved.  Significantly short of breath when going to the bathroom Continue steroids, inhalers and antibiotics. Culture data has remained negative.  Hypokalemia Repleted now resolved.  Sinus tachycardia: Now resolved.  GAD (generalized anxiety disorder) Noted continue current home meds.  Continuous dependence on cigarette smoking Counseling.   DVT prophylaxis: lovenox  Family Communication:none Status is: Observation The patient will require care spanning > 2 midnights and should be moved to inpatient because: Acute respiratory failure with hypoxia    Code Status:     Code Status Orders  (From admission, onward)           Start     Ordered   04/10/24 2311  Full code  Continuous       Question:  By:  Answer:  Consent: discussion documented in EHR   04/10/24 2311           Code Status History     Date Active Date Inactive Code Status Order ID Comments User Context   06/24/2023 2306 06/28/2023 1832 Full Code [id number]  Fernand Prost, MD ED   06/16/2023 1816 06/21/2023 1907 Full Code [id number]  Tobie Gaines, DO ED   12/11/2021 0556 12/14/2021 1617 Full Code [id number]  Franky Redia SAILOR, MD ED   08/06/2017 1649 08/08/2017 1805 Full Code [id number]  Gerlean Earnie BIRCH, CNM Inpatient   08/06/2017 0902 08/06/2017 1649 Full Code [id number]  Larwence Mliss SAILOR, PA-C Inpatient   02/27/2016 0826 02/29/2016 1635 Full Code [id number]  Dina Camie BRAVO, PA-C Inpatient          IV Access:   Peripheral IV   Procedures and diagnostic studies:   No results found.    Medical Consultants:   None.   Subjective:    Vinetta Monne [place] of breath when going to the bathroom.  Objective:    Vitals:   04/12/24 1730 04/12/24 2001 04/13/24 0603 04/13/24 0738  BP:  109/68 127/74 (!) 106/58  Pulse: 97 86  64  Resp:  20 20 18   Temp:  (!) 97.3 F (36.3 C) 98 F (36.7 C) 98 F (36.7 C)  TempSrc:   Oral Oral  SpO2: 94% 95% 97% 100%  Weight:      Height:       SpO2: 100 % O2 Flow Rate (L/min): 4 L/min FiO2 (%): 28 %   Intake/Output Summary (Last 24 hours) at 04/13/2024 0749 Last data filed at 04/12/2024 2001 Gross per 24 hour  Intake 960 ml  Output --  Net 960 ml   Filed Weights   04/10/24 1801  Weight: 111.1 kg    Exam: General exam: In no acute distress. Respiratory system: Air movement has improved, still wheezing bilaterally. Cardiovascular system: S1 & S2 heard, RRR. No JVD. Gastrointestinal system: Abdomen is nondistended, soft and nontender.  Extremities: No pedal edema. Skin: No rashes, lesions or ulcers Psychiatry: Judgement and insight appear normal. Mood & affect appropriate.  Data Reviewed:    Labs: Basic Metabolic Panel: Recent Labs  Lab 04/10/24 1955 04/11/24 0412  NA 138 138  K 3.0* 3.8  CL 104 106  CO2 17* 20*  GLUCOSE 149* 221*  BUN 11 11  CREATININE 0.92 0.75  CALCIUM 9.3 9.3   GFR Estimated Creatinine Clearance: 123.2 mL/min (by C-G formula based on SCr of 0.75 mg/dL). Liver Function Tests: No results for input(s): AST, ALT, ALKPHOS, BILITOT, PROT, ALBUMIN in the last 168 hours. No results for input(s): LIPASE, AMYLASE in the last 168 hours. No results for input(s): AMMONIA in the last 168 hours. Coagulation profile No results for input(s): INR, PROTIME in the last 168 hours. COVID-19 Labs  No results for input(s): DDIMER, FERRITIN, LDH, CRP in the last 72  hours.  Lab Results  Component Value Date   SARSCOV2NAA NEGATIVE 04/10/2024   SARSCOV2NAA NEGATIVE 12/09/2021    CBC: Recent Labs  Lab 04/10/24 1955 04/11/24 0412  WBC 13.4* 8.7  NEUTROABS 10.5*  --   HGB 14.9 14.0  HCT 45.6 42.4  MCV 89.2 88.3  PLT 221 206   Cardiac Enzymes: No results for input(s): CKTOTAL, CKMB, CKMBINDEX, TROPONINI in the last 168 hours. BNP (last 3 results) No results for input(s): PROBNP in the last 8760 hours. CBG: No results for input(s): GLUCAP in the last 168 hours. D-Dimer: No results for input(s): DDIMER in the last 72 hours. Hgb A1c: No results for input(s): HGBA1C in the last 72 hours. Lipid Profile: No results for input(s): CHOL, HDL, LDLCALC, TRIG, CHOLHDL, LDLDIRECT in the last 72 hours. Thyroid function studies: No results for input(s): TSH, T4TOTAL, T3FREE, THYROIDAB in the last 72 hours.  Invalid input(s): FREET3 Anemia work up: No results for input(s): VITAMINB12, FOLATE, FERRITIN, TIBC, IRON , RETICCTPCT in the last 72 hours. Sepsis Labs: Recent Labs  Lab 04/10/24 1955 04/11/24 0412  WBC 13.4* 8.7   Microbiology Recent Results (from the past 240 hours)  Resp panel by RT-PCR (RSV, Flu A&B, Covid) Anterior Nasal Swab     Status: None   Collection Time: 04/10/24  6:20 PM   Specimen: Anterior Nasal Swab  Result Value Ref Range Status   SARS Coronavirus 2 by RT PCR NEGATIVE NEGATIVE Final   Influenza A by PCR NEGATIVE NEGATIVE Final   Influenza B by PCR NEGATIVE NEGATIVE Final    Comment: (NOTE) The Xpert Xpress SARS-CoV-2/FLU/RSV plus assay is intended as an aid in the diagnosis of influenza from Nasopharyngeal swab specimens and should not be used as a sole basis for treatment. Nasal washings and aspirates are unacceptable for Xpert Xpress SARS-CoV-2/FLU/RSV testing.  Fact Sheet for Patients: BloggerCourse.com  Fact Sheet for Healthcare  Providers: SeriousBroker.it  This test is not yet approved or cleared by the [place]  FDA and has been authorized for detection and/or diagnosis of SARS-CoV-2 by FDA under an Emergency Use Authorization (EUA). This EUA will remain in effect (meaning this test can be used) for the duration of the COVID-19 declaration under Section 564(b)(1) of the Act, 21 U.S.C. section 360bbb-3(b)(1), unless the authorization is terminated or revoked.     Resp Syncytial Virus by PCR NEGATIVE NEGATIVE Final    Comment: (NOTE) Fact Sheet for Patients: BloggerCourse.com  Fact Sheet for Healthcare Providers: SeriousBroker.it  This test is not yet approved or cleared by the [place]  FDA and has been authorized for detection and/or diagnosis of SARS-CoV-2 by FDA under an Emergency Use Authorization (EUA). This EUA will remain in effect (meaning this test can be used) for the duration of the COVID-19 declaration under Section  564(b)(1) of the Act, 21 U.S.C. section 360bbb-3(b)(1), unless the authorization is terminated or revoked.  Performed at [facility], [address] [place], [place] [address]   Culture, blood (routine x 2)     Status: None (Preliminary result)   Collection Time: 04/10/24  7:55 PM   Specimen: BLOOD  Result Value Ref Range Status   Specimen Description BLOOD RIGHT ANTECUBITAL  Final   Special Requests   Final    BOTTLES DRAWN AEROBIC AND ANAEROBIC Blood Culture adequate volume   Culture   Final    NO GROWTH 2 DAYS Performed at [facility], [address], [place], [place] [address]    Report Status PENDING  Incomplete  Culture, blood (routine x 2)     Status: None (Preliminary result)   Collection Time: 04/10/24  7:55 PM   Specimen: BLOOD LEFT FOREARM  Result Value Ref Range Status   Specimen Description BLOOD LEFT FOREARM  Final   Special Requests   Final    BOTTLES DRAWN  AEROBIC AND ANAEROBIC Blood Culture adequate volume   Culture   Final    NO GROWTH 2 DAYS Performed at [facility], [address] [place], [place] [address]    Report Status PENDING  Incomplete  Respiratory (~20 pathogens) panel by PCR     Status: Abnormal   Collection Time: 04/11/24  6:25 AM   Specimen: Nasopharyngeal Swab; Respiratory  Result Value Ref Range Status   Adenovirus NOT DETECTED NOT DETECTED Final   Coronavirus 229E NOT DETECTED NOT DETECTED Final    Comment: (NOTE) The Coronavirus on the Respiratory Panel, DOES NOT test for the novel  Coronavirus (2019 nCoV)    Coronavirus HKU1 NOT DETECTED NOT DETECTED Final   Coronavirus NL63 NOT DETECTED NOT DETECTED Final   Coronavirus OC43 NOT DETECTED NOT DETECTED Final   Metapneumovirus NOT DETECTED NOT DETECTED Final   Rhinovirus / Enterovirus DETECTED (A) NOT DETECTED Final   Influenza A NOT DETECTED NOT DETECTED Final   Influenza B NOT DETECTED NOT DETECTED Final   Parainfluenza Virus 1 NOT DETECTED NOT DETECTED Final   Parainfluenza Virus 2 NOT DETECTED NOT DETECTED Final   Parainfluenza Virus 3 NOT DETECTED NOT DETECTED Final   Parainfluenza Virus 4 NOT DETECTED NOT DETECTED Final   Respiratory Syncytial Virus NOT DETECTED NOT DETECTED Final   Bordetella pertussis NOT DETECTED NOT DETECTED Final   Bordetella Parapertussis NOT DETECTED NOT DETECTED Final   Chlamydophila pneumoniae NOT DETECTED NOT DETECTED Final   Mycoplasma pneumoniae NOT DETECTED NOT DETECTED Final    Comment: Performed at [facility], [address], [place], [place] [address]     Medications:    enoxaparin  (LOVENOX ) injection  40 mg Subcutaneous Q24H   guaiFENesin   600 mg Oral BID   ipratropium-albuterol   3 mL Nebulization Q6H   methylPREDNISolone  (SOLU-MEDROL ) injection  40 mg Intravenous Q12H   nicotine   21 mg Transdermal Daily   pantoprazole   40 mg Oral Daily   sodium chloride  flush  3 mL Intravenous Q12H   Continuous  Infusions:      LOS: 2 days   Erle Odell Castor  [facility]  04/13/2024, 7:49 AM

## 2024-04-14 DIAGNOSIS — J4541 Moderate persistent asthma with (acute) exacerbation: Secondary | ICD-10-CM | POA: Diagnosis not present

## 2024-04-14 MED ORDER — ALBUTEROL SULFATE HFA 108 (90 BASE) MCG/ACT IN AERS: 2.0000 | INHALATION_SPRAY | Freq: Four times a day (QID) | RESPIRATORY_TRACT | 2 refills | Status: AC | PRN

## 2024-04-14 MED ORDER — FLUTICASONE-SALMETEROL 230-21 MCG/ACT IN AERO: 2.0000 | INHALATION_SPRAY | Freq: Two times a day (BID) | RESPIRATORY_TRACT | 12 refills | Status: AC

## 2024-04-14 MED ORDER — IPRATROPIUM-ALBUTEROL 0.5-2.5 (3) MG/3ML IN SOLN
3.0000 mL | Freq: Three times a day (TID) | RESPIRATORY_TRACT | Status: DC
  Administered 2024-04-14: 3 mL via RESPIRATORY_TRACT
  Filled 2024-04-14: qty 3

## 2024-04-14 MED ORDER — PREDNISONE 10 MG PO TABS: ORAL_TABLET | ORAL | 0 refills | Status: AC

## 2024-04-14 NOTE — Plan of Care (Signed)
  Problem: Activity: Goal: Ability to perform activities at highest level will improve Outcome: Progressing   Problem: Respiratory: Goal: Respiratory status will improve Outcome: Progressing Goal: Will regain and/or maintain adequate ventilation Outcome: Progressing Goal: Diagnostic test results will improve Outcome: Progressing Goal: Identification of resources available to assist in meeting health care needs will improve Outcome: Progressing   Problem: Education: Goal: Knowledge of General Education information will improve Description: Including pain rating scale, medication(s)/side effects and non-pharmacologic comfort measures Outcome: Progressing   Problem: Clinical Measurements: Goal: Ability to maintain clinical measurements within normal limits will improve Outcome: Progressing Goal: Will remain free from infection Outcome: Progressing Goal: Diagnostic test results will improve Outcome: Progressing Goal: Respiratory complications will improve Outcome: Progressing Goal: Cardiovascular complication will be avoided Outcome: Progressing

## 2024-04-14 NOTE — Discharge Summary (Signed)
 Physician Discharge Summary  Mercedes [name] FMW:[id number] DOB: 05/25/1991 DOA: 04/10/2024  PCP: Pcp, No  Admit date: 04/10/2024 Discharge date: 04/14/2024  Admitted From: Home Disposition:  Home  Recommendations for Outpatient Follow-up:  Follow up with PCP in 1-2 weeks Please obtain BMP/CBC in one week   Home Health:No Equipment/Devices:None  Discharge Condition:Stable CODE STATUS:Full Diet recommendation: Heart Healthy   Brief/Interim Summary: 33 y.o. female past medical history of asthma comes in for shortness of breath fever and lightheadedness and ongoing cough for a week despite DuoNebs.   Discharge Diagnoses:  Principal Problem:   Acute asthma exacerbation Active Problems:   Asthma, chronic obstructive, with acute exacerbation (HCC)   Acute bronchitis   Hypokalemia   GAD (generalized anxiety disorder)   Sinus tachycardia   Continuous dependence on cigarette smoking   Asthma exacerbation  Acute respiratory failure with hypoxia secondary to acute asthma exacerbation: Initially had to be placed on 4 L of oxygen she was moving to room air start empirically on IV steroids and he is rescheduled. Took about 3 to 4 days to come off the oxygen. He was transition to a steroid taper which should continue as an outpatient. Show continue albuterol  and inhaled steroids and long-acting beta agonist as an outpatient.  Hypokalemia: Likely due to his albuterol  treatments now resolved.  Sinus tachycardia: Resolved  GERD:  continue current home meds.  Tobacco abuse: She has been counseled.  Discharge Instructions  Discharge Instructions     Diet - low sodium heart healthy   Complete by: As directed    Increase activity slowly   Complete by: As directed       Allergies as of 04/14/2024       Reactions   Penicillins Other (See Comments)   Reaction:  Unknown  Has patient had a PCN reaction causing immediate rash, facial/tongue/throat swelling, SOB or  lightheadedness with hypotension: Unknown Has patient had a PCN reaction causing severe rash involving mucus membranes or skin necrosis: Unknown Has patient had a PCN reaction that required hospitalization: Unknown Has patient had a PCN reaction occurring within the last 10 years: No If all of the above answers are NO, then may proceed with Cephalosporin use. Unknown reaction Childhood allergy        Medication List     TAKE these medications    albuterol  108 (90 Base) MCG/ACT inhaler Commonly known as: VENTOLIN  HFA Inhale 2 puffs into the lungs every 6 (six) hours as needed for wheezing or shortness of breath.   fluticasone -salmeterol 230-21 MCG/ACT inhaler Commonly known as: Advair HFA Inhale 2 puffs into the lungs 2 (two) times daily.   predniSONE  10 MG tablet Commonly known as: DELTASONE  Takes 6 tablets for 1 days, then 5 tablets for 1 days, then 4 tablets for 1 days, then 3 tablets for 1 days, then 2 tabs for 1 days, then 1 tab for 1 days, and then stop.        Allergies  Allergen Reactions   Penicillins Other (See Comments)    Reaction:  Unknown  Has patient had a PCN reaction causing immediate rash, facial/tongue/throat swelling, SOB or lightheadedness with hypotension: Unknown Has patient had a PCN reaction causing severe rash involving mucus membranes or skin necrosis: Unknown Has patient had a PCN reaction that required hospitalization: Unknown Has patient had a PCN reaction occurring within the last 10 years: No If all of the above answers are NO, then may proceed with Cephalosporin use. Unknown reaction Childhood allergy  Consultations: None   Procedures/Studies: DG Chest Port 1 View Result Date: 04/10/2024 CLINICAL DATA:  Short of breath EXAM: PORTABLE CHEST 1 VIEW COMPARISON:  06/24/2023 FINDINGS: The heart size and mediastinal contours are within normal limits. Both lungs are clear. The visualized skeletal structures are unremarkable.  IMPRESSION: No active disease. Electronically Signed   By: Ozell Daring M.D.   On: 04/10/2024 19:08      Subjective: No complaints feels well  Discharge Exam: Vitals:   04/14/24 0500 04/14/24 0735  BP: 118/82 (!) 107/56  Pulse: 84 71  Resp: 17 18  Temp: 98.4 F (36.9 C) 98 F (36.7 C)  SpO2: 98% 92%   Vitals:   04/13/24 1941 04/13/24 2004 04/14/24 0500 04/14/24 0735  BP:  123/84 118/82 (!) 107/56  Pulse:  88 84 71  Resp:  18 17 18   Temp:  98.1 F (36.7 C) 98.4 F (36.9 C) 98 F (36.7 C)  TempSrc:   Oral Oral  SpO2: 96% 98% 98% 92%  Weight:      Height:        General: Pt is alert, awake, not in acute distress Cardiovascular: RRR, S1/S2 +, no rubs, no gallops Respiratory: CTA bilaterally, no wheezing, no rhonchi Abdominal: Soft, NT, ND, bowel sounds + Extremities: no edema, no cyanosis    The results of significant diagnostics from this hospitalization (including imaging, microbiology, ancillary and laboratory) are listed below for reference.     Microbiology: Recent Results (from the past 240 hours)  Resp panel by RT-PCR (RSV, Flu A&B, Covid) Anterior Nasal Swab     Status: None   Collection Time: 04/10/24  6:20 PM   Specimen: Anterior Nasal Swab  Result Value Ref Range Status   SARS Coronavirus 2 by RT PCR NEGATIVE NEGATIVE Final   Influenza A by PCR NEGATIVE NEGATIVE Final   Influenza B by PCR NEGATIVE NEGATIVE Final    Comment: (NOTE) The Xpert Xpress SARS-CoV-2/FLU/RSV plus assay is intended as an aid in the diagnosis of influenza from Nasopharyngeal swab specimens and should not be used as a sole basis for treatment. Nasal washings and aspirates are unacceptable for Xpert Xpress SARS-CoV-2/FLU/RSV testing.  Fact Sheet for Patients: BloggerCourse.com  Fact Sheet for Healthcare Providers: SeriousBroker.it  This test is not yet approved or cleared by the [place]  FDA and has been authorized  for detection and/or diagnosis of SARS-CoV-2 by FDA under an Emergency Use Authorization (EUA). This EUA will remain in effect (meaning this test can be used) for the duration of the COVID-19 declaration under Section 564(b)(1) of the Act, 21 U.S.C. section 360bbb-3(b)(1), unless the authorization is terminated or revoked.     Resp Syncytial Virus by PCR NEGATIVE NEGATIVE Final    Comment: (NOTE) Fact Sheet for Patients: BloggerCourse.com  Fact Sheet for Healthcare Providers: SeriousBroker.it  This test is not yet approved or cleared by the [place]  FDA and has been authorized for detection and/or diagnosis of SARS-CoV-2 by FDA under an Emergency Use Authorization (EUA). This EUA will remain in effect (meaning this test can be used) for the duration of the COVID-19 declaration under Section 564(b)(1) of the Act, 21 U.S.C. section 360bbb-3(b)(1), unless the authorization is terminated or revoked.  Performed at [facility], [address] [place], [place] [address]   Culture, blood (routine x 2)     Status: None (Preliminary result)   Collection Time: 04/10/24  7:55 PM   Specimen: BLOOD  Result Value Ref Range Status  Specimen Description BLOOD RIGHT ANTECUBITAL  Final   Special Requests   Final    BOTTLES DRAWN AEROBIC AND ANAEROBIC Blood Culture adequate volume   Culture   Final    NO GROWTH 3 DAYS Performed at [facility], [address] [place], [place] [address]    Report Status PENDING  Incomplete  Culture, blood (routine x 2)     Status: None (Preliminary result)   Collection Time: 04/10/24  7:55 PM   Specimen: BLOOD LEFT FOREARM  Result Value Ref Range Status   Specimen Description BLOOD LEFT FOREARM  Final   Special Requests   Final    BOTTLES DRAWN AEROBIC AND ANAEROBIC Blood Culture adequate volume   Culture   Final    NO GROWTH 3 DAYS Performed at [facility], [address]  [place], [place] [address]    Report Status PENDING  Incomplete  Respiratory (~20 pathogens) panel by PCR     Status: Abnormal   Collection Time: 04/11/24  6:25 AM   Specimen: Nasopharyngeal Swab; Respiratory  Result Value Ref Range Status   Adenovirus NOT DETECTED NOT DETECTED Final   Coronavirus 229E NOT DETECTED NOT DETECTED Final    Comment: (NOTE) The Coronavirus on the Respiratory Panel, DOES NOT test for the novel  Coronavirus (2019 nCoV)    Coronavirus HKU1 NOT DETECTED NOT DETECTED Final   Coronavirus NL63 NOT DETECTED NOT DETECTED Final   Coronavirus OC43 NOT DETECTED NOT DETECTED Final   Metapneumovirus NOT DETECTED NOT DETECTED Final   Rhinovirus / Enterovirus DETECTED (A) NOT DETECTED Final   Influenza A NOT DETECTED NOT DETECTED Final   Influenza B NOT DETECTED NOT DETECTED Final   Parainfluenza Virus 1 NOT DETECTED NOT DETECTED Final   Parainfluenza Virus 2 NOT DETECTED NOT DETECTED Final   Parainfluenza Virus 3 NOT DETECTED NOT DETECTED Final   Parainfluenza Virus 4 NOT DETECTED NOT DETECTED Final   Respiratory Syncytial Virus NOT DETECTED NOT DETECTED Final   Bordetella pertussis NOT DETECTED NOT DETECTED Final   Bordetella Parapertussis NOT DETECTED NOT DETECTED Final   Chlamydophila pneumoniae NOT DETECTED NOT DETECTED Final   Mycoplasma pneumoniae NOT DETECTED NOT DETECTED Final    Comment: Performed at [facility], [address], [place], [place] [address]     Labs: BNP (last 3 results) Recent Labs    04/10/24 1955  BNP 3.4   Basic Metabolic Panel: Recent Labs  Lab 04/10/24 1955 04/11/24 0412  NA 138 138  K 3.0* 3.8  CL 104 106  CO2 17* 20*  GLUCOSE 149* 221*  BUN 11 11  CREATININE 0.92 0.75  CALCIUM 9.3 9.3   Liver Function Tests: No results for input(s): AST, ALT, ALKPHOS, BILITOT, PROT, ALBUMIN in the last 168 hours. No results for input(s): LIPASE, AMYLASE in the last 168 hours. No results for input(s): AMMONIA in  the last 168 hours. CBC: Recent Labs  Lab 04/10/24 1955 04/11/24 0412  WBC 13.4* 8.7  NEUTROABS 10.5*  --   HGB 14.9 14.0  HCT 45.6 42.4  MCV 89.2 88.3  PLT 221 206   Cardiac Enzymes: No results for input(s): CKTOTAL, CKMB, CKMBINDEX, TROPONINI in the last 168 hours. BNP: Invalid input(s): POCBNP CBG: No results for input(s): GLUCAP in the last 168 hours. D-Dimer No results for input(s): DDIMER in the last 72 hours. Hgb A1c No results for input(s): HGBA1C in the last 72 hours. Lipid Profile No results for input(s): CHOL, HDL, LDLCALC,  TRIG, CHOLHDL, LDLDIRECT in the last 72 hours. Thyroid function studies No results for input(s): TSH, T4TOTAL, T3FREE, THYROIDAB in the last 72 hours.  Invalid input(s): FREET3 Anemia work up No results for input(s): VITAMINB12, FOLATE, FERRITIN, TIBC, IRON , RETICCTPCT in the last 72 hours. Urinalysis    Component Value Date/Time   COLORURINE AMBER (A) 06/24/2023 1745   APPEARANCEUR HAZY (A) 06/24/2023 1745   LABSPEC 1.026 06/24/2023 1745   PHURINE 5.0 06/24/2023 1745   GLUCOSEU NEGATIVE 06/24/2023 1745   HGBUR NEGATIVE 06/24/2023 1745   BILIRUBINUR SMALL (A) 06/24/2023 1745   KETONESUR 5 (A) 06/24/2023 1745   PROTEINUR 30 (A) 06/24/2023 1745   UROBILINOGEN 0.2 11/23/2017 1541   NITRITE NEGATIVE 06/24/2023 1745   LEUKOCYTESUR SMALL (A) 06/24/2023 1745   Sepsis Labs Recent Labs  Lab 04/10/24 1955 04/11/24 0412  WBC 13.4* 8.7   Microbiology Recent Results (from the past 240 hours)  Resp panel by RT-PCR (RSV, Flu A&B, Covid) Anterior Nasal Swab     Status: None   Collection Time: 04/10/24  6:20 PM   Specimen: Anterior Nasal Swab  Result Value Ref Range Status   SARS Coronavirus 2 by RT PCR NEGATIVE NEGATIVE Final   Influenza A by PCR NEGATIVE NEGATIVE Final   Influenza B by PCR NEGATIVE NEGATIVE Final    Comment: (NOTE) The Xpert Xpress SARS-CoV-2/FLU/RSV plus assay is  intended as an aid in the diagnosis of influenza from Nasopharyngeal swab specimens and should not be used as a sole basis for treatment. Nasal washings and aspirates are unacceptable for Xpert Xpress SARS-CoV-2/FLU/RSV testing.  Fact Sheet for Patients: BloggerCourse.com  Fact Sheet for Healthcare Providers: SeriousBroker.it  This test is not yet approved or cleared by the [place]  FDA and has been authorized for detection and/or diagnosis of SARS-CoV-2 by FDA under an Emergency Use Authorization (EUA). This EUA will remain in effect (meaning this test can be used) for the duration of the COVID-19 declaration under Section 564(b)(1) of the Act, 21 U.S.C. section 360bbb-3(b)(1), unless the authorization is terminated or revoked.     Resp Syncytial Virus by PCR NEGATIVE NEGATIVE Final    Comment: (NOTE) Fact Sheet for Patients: BloggerCourse.com  Fact Sheet for Healthcare Providers: SeriousBroker.it  This test is not yet approved or cleared by the [place]  FDA and has been authorized for detection and/or diagnosis of SARS-CoV-2 by FDA under an Emergency Use Authorization (EUA). This EUA will remain in effect (meaning this test can be used) for the duration of the COVID-19 declaration under Section 564(b)(1) of the Act, 21 U.S.C. section 360bbb-3(b)(1), unless the authorization is terminated or revoked.  Performed at [facility], [address] [place], [place] [address]   Culture, blood (routine x 2)     Status: None (Preliminary result)   Collection Time: 04/10/24  7:55 PM   Specimen: BLOOD  Result Value Ref Range Status   Specimen Description BLOOD RIGHT ANTECUBITAL  Final   Special Requests   Final    BOTTLES DRAWN AEROBIC AND ANAEROBIC Blood Culture adequate volume   Culture   Final    NO GROWTH 3 DAYS Performed at [facility], [address] [place], [place] [address]    Report Status PENDING  Incomplete  Culture, blood (routine x 2)     Status: None (Preliminary result)   Collection Time: 04/10/24  7:55 PM   Specimen: BLOOD LEFT FOREARM  Result Value Ref Range Status   Specimen Description BLOOD LEFT  FOREARM  Final   Special Requests   Final    BOTTLES DRAWN AEROBIC AND ANAEROBIC Blood Culture adequate volume   Culture   Final    NO GROWTH 3 DAYS Performed at [facility], [address], [place], [place] [address]    Report Status PENDING  Incomplete  Respiratory (~20 pathogens) panel by PCR     Status: Abnormal   Collection Time: 04/11/24  6:25 AM   Specimen: Nasopharyngeal Swab; Respiratory  Result Value Ref Range Status   Adenovirus NOT DETECTED NOT DETECTED Final   Coronavirus 229E NOT DETECTED NOT DETECTED Final    Comment: (NOTE) The Coronavirus on the Respiratory Panel, DOES NOT test for the novel  Coronavirus (2019 nCoV)    Coronavirus HKU1 NOT DETECTED NOT DETECTED Final   Coronavirus NL63 NOT DETECTED NOT DETECTED Final   Coronavirus OC43 NOT DETECTED NOT DETECTED Final   Metapneumovirus NOT DETECTED NOT DETECTED Final   Rhinovirus / Enterovirus DETECTED (A) NOT DETECTED Final   Influenza A NOT DETECTED NOT DETECTED Final   Influenza B NOT DETECTED NOT DETECTED Final   Parainfluenza Virus 1 NOT DETECTED NOT DETECTED Final   Parainfluenza Virus 2 NOT DETECTED NOT DETECTED Final   Parainfluenza Virus 3 NOT DETECTED NOT DETECTED Final   Parainfluenza Virus 4 NOT DETECTED NOT DETECTED Final   Respiratory Syncytial Virus NOT DETECTED NOT DETECTED Final   Bordetella pertussis NOT DETECTED NOT DETECTED Final   Bordetella Parapertussis NOT DETECTED NOT DETECTED Final   Chlamydophila pneumoniae NOT DETECTED NOT DETECTED Final   Mycoplasma pneumoniae NOT DETECTED NOT DETECTED Final    Comment: Performed at [facility], [address], [place], [place] [address]    SIGNED:   Erle Odell Castor, MD  [facility] 04/14/2024, 8:08 AM Pager   If 7PM-7AM, please contact night-coverage [email or web address] Password [email or web address]

## 2024-04-14 NOTE — Plan of Care (Signed)
  Problem: Activity: Goal: Ability to perform activities at highest level will improve Outcome: Progressing   Problem: Respiratory: Goal: Respiratory status will improve Outcome: Progressing Goal: Will regain and/or maintain adequate ventilation Outcome: Progressing Goal: Diagnostic test results will improve Outcome: Progressing Goal: Identification of resources available to assist in meeting health care needs will improve Outcome: Progressing   Problem: Education: Goal: Knowledge of General Education information will improve Description: Including pain rating scale, medication(s)/side effects and non-pharmacologic comfort measures Outcome: Progressing   Problem: Health Behavior/Discharge Planning: Goal: Ability to manage health-related needs will improve Outcome: Progressing   Problem: Clinical Measurements: Goal: Ability to maintain clinical measurements within normal limits will improve Outcome: Progressing Goal: Will remain free from infection Outcome: Progressing Goal: Diagnostic test results will improve Outcome: Progressing Goal: Respiratory complications will improve Outcome: Progressing Goal: Cardiovascular complication will be avoided Outcome: Progressing   Problem: Activity: Goal: Risk for activity intolerance will decrease Outcome: Progressing   Problem: Nutrition: Goal: Adequate nutrition will be maintained Outcome: Progressing   Problem: Coping: Goal: Level of anxiety will decrease Outcome: Progressing   Problem: Elimination: Goal: Will not experience complications related to bowel motility Outcome: Progressing Goal: Will not experience complications related to urinary retention Outcome: Progressing   Problem: Pain Managment: Goal: General experience of comfort will improve and/or be controlled Outcome: Progressing   Problem: Safety: Goal: Ability to remain free from injury will improve Outcome: Progressing   Problem: Skin Integrity: Goal:  Risk for impaired skin integrity will decrease Outcome: Progressing

## 2024-04-14 NOTE — Progress Notes (Signed)
 DISCHARGE NOTE HOME Mercedes [name] to be discharged Home per MD order. Discussed prescriptions and follow up appointments with the patient. Prescriptions given to patient; medication list explained in detail. Patient verbalized understanding.  Skin clean, dry and intact without evidence of skin break down, no evidence of skin tears noted. IV catheter discontinued intact. Site without signs and symptoms of complications. Dressing and pressure applied. Pt denies pain at the site currently. No complaints noted.  Patient free of lines, drains, and wounds.   An After Visit Summary (AVS) was printed and given to the patient. Patient escorted via wheelchair, and discharged home via private auto.  Doyal Sias, RN

## 2024-04-15 LAB — CULTURE, BLOOD (ROUTINE X 2)
Culture: NO GROWTH
Culture: NO GROWTH
Special Requests: ADEQUATE
Special Requests: ADEQUATE

## 2024-05-02 ENCOUNTER — Encounter: Admitting: Adult Health

## 2024-05-02 DIAGNOSIS — Z7689 Persons encountering health services in other specified circumstances: Secondary | ICD-10-CM

## 2024-05-02 DIAGNOSIS — F411 Generalized anxiety disorder: Secondary | ICD-10-CM

## 2024-05-02 DIAGNOSIS — Z124 Encounter for screening for malignant neoplasm of cervix: Secondary | ICD-10-CM

## 2024-05-02 DIAGNOSIS — Z720 Tobacco use: Secondary | ICD-10-CM

## 2024-05-02 DIAGNOSIS — Z113 Encounter for screening for infections with a predominantly sexual mode of transmission: Secondary | ICD-10-CM

## 2024-05-02 DIAGNOSIS — J4520 Mild intermittent asthma, uncomplicated: Secondary | ICD-10-CM

## 2024-05-02 DIAGNOSIS — K219 Gastro-esophageal reflux disease without esophagitis: Secondary | ICD-10-CM

## 2024-05-02 DIAGNOSIS — R7989 Other specified abnormal findings of blood chemistry: Secondary | ICD-10-CM

## 2024-05-02 NOTE — Progress Notes (Signed)
 This encounter was created in error - please disregard.

## 2024-05-21 ENCOUNTER — Other Ambulatory Visit (HOSPITAL_COMMUNITY): Payer: Self-pay
